# Patient Record
Sex: Female | Born: 1955
Health system: Southern US, Community
[De-identification: ages and names within clinical notes are randomized; demographics above are authoritative.]

## PROBLEM LIST (undated history)

## (undated) DIAGNOSIS — IMO0002 Reserved for concepts with insufficient information to code with codable children: Secondary | ICD-10-CM

## (undated) DIAGNOSIS — I10 Essential (primary) hypertension: Secondary | ICD-10-CM

## (undated) DIAGNOSIS — C549 Malignant neoplasm of corpus uteri, unspecified: Secondary | ICD-10-CM

## (undated) DIAGNOSIS — C541 Malignant neoplasm of endometrium: Secondary | ICD-10-CM

## (undated) DIAGNOSIS — T7840XA Allergy, unspecified, initial encounter: Secondary | ICD-10-CM

## (undated) DIAGNOSIS — Z87410 Personal history of cervical dysplasia: Secondary | ICD-10-CM

## (undated) DIAGNOSIS — N289 Disorder of kidney and ureter, unspecified: Secondary | ICD-10-CM

## (undated) DIAGNOSIS — K449 Diaphragmatic hernia without obstruction or gangrene: Secondary | ICD-10-CM

## (undated) DIAGNOSIS — Z9889 Other specified postprocedural states: Secondary | ICD-10-CM

## (undated) DIAGNOSIS — R112 Nausea with vomiting, unspecified: Secondary | ICD-10-CM

## (undated) DIAGNOSIS — E785 Hyperlipidemia, unspecified: Secondary | ICD-10-CM

## (undated) DIAGNOSIS — K219 Gastro-esophageal reflux disease without esophagitis: Secondary | ICD-10-CM

## (undated) DIAGNOSIS — C569 Malignant neoplasm of unspecified ovary: Secondary | ICD-10-CM

## (undated) HISTORY — DX: Diaphragmatic hernia without obstruction or gangrene: K44.9

## (undated) HISTORY — DX: Malignant neoplasm of unspecified ovary: C56.9

## (undated) HISTORY — PX: MINOR CARPAL TUNNEL: SHX6472

## (undated) HISTORY — DX: Other specified postprocedural states: Z98.890

## (undated) HISTORY — PX: PARS PLANA VITRECTOMY W/ REPAIR OF MACULAR HOLE: SHX2170

## (undated) HISTORY — PX: EYE SURGERY: SHX253

## (undated) HISTORY — DX: Hyperlipidemia, unspecified: E78.5

## (undated) HISTORY — DX: Malignant neoplasm of endometrium: C54.1

## (undated) HISTORY — DX: Allergy, unspecified, initial encounter: T78.40XA

## (undated) HISTORY — PX: ACHILLES TENDON SURGERY: SHX542

## (undated) HISTORY — DX: Other specified postprocedural states: R11.2

## (undated) HISTORY — DX: Malignant neoplasm of corpus uteri, unspecified: C54.9

## (undated) HISTORY — DX: Personal history of cervical dysplasia: Z87.410

## (undated) HISTORY — DX: Gastro-esophageal reflux disease without esophagitis: K21.9

## (undated) HISTORY — DX: Reserved for concepts with insufficient information to code with codable children: IMO0002

## (undated) HISTORY — DX: Essential (primary) hypertension: I10

---

## 1998-03-30 ENCOUNTER — Encounter: Payer: Self-pay | Admitting: Orthopedic Surgery

## 1998-03-30 ENCOUNTER — Observation Stay (HOSPITAL_COMMUNITY): Admission: RE | Admit: 1998-03-30 | Discharge: 1998-03-31 | Payer: Self-pay | Admitting: Orthopedic Surgery

## 1998-11-05 HISTORY — PX: LUMBAR FUSION: SHX111

## 1998-12-14 ENCOUNTER — Other Ambulatory Visit: Admission: RE | Admit: 1998-12-14 | Discharge: 1998-12-14 | Payer: Self-pay | Admitting: Obstetrics and Gynecology

## 2000-01-19 ENCOUNTER — Other Ambulatory Visit: Admission: RE | Admit: 2000-01-19 | Discharge: 2000-01-19 | Payer: Self-pay | Admitting: Obstetrics and Gynecology

## 2001-01-22 ENCOUNTER — Other Ambulatory Visit: Admission: RE | Admit: 2001-01-22 | Discharge: 2001-01-22 | Payer: Self-pay | Admitting: Obstetrics and Gynecology

## 2002-01-14 ENCOUNTER — Other Ambulatory Visit: Admission: RE | Admit: 2002-01-14 | Discharge: 2002-01-14 | Payer: Self-pay | Admitting: Obstetrics and Gynecology

## 2003-01-20 ENCOUNTER — Other Ambulatory Visit: Admission: RE | Admit: 2003-01-20 | Discharge: 2003-01-20 | Payer: Self-pay | Admitting: Obstetrics and Gynecology

## 2003-02-05 HISTORY — PX: GYNECOLOGIC CRYOSURGERY: SHX857

## 2003-02-23 ENCOUNTER — Ambulatory Visit (HOSPITAL_COMMUNITY): Admission: RE | Admit: 2003-02-23 | Discharge: 2003-02-23 | Payer: Self-pay | Admitting: Obstetrics and Gynecology

## 2003-02-23 ENCOUNTER — Encounter (INDEPENDENT_AMBULATORY_CARE_PROVIDER_SITE_OTHER): Payer: Self-pay | Admitting: *Deleted

## 2003-09-05 HISTORY — PX: CERVICAL FUSION: SHX112

## 2003-10-04 ENCOUNTER — Emergency Department (HOSPITAL_COMMUNITY): Admission: EM | Admit: 2003-10-04 | Discharge: 2003-10-04 | Payer: Self-pay | Admitting: Emergency Medicine

## 2003-10-05 ENCOUNTER — Emergency Department (HOSPITAL_COMMUNITY): Admission: EM | Admit: 2003-10-05 | Discharge: 2003-10-05 | Payer: Self-pay | Admitting: Family Medicine

## 2003-10-17 ENCOUNTER — Encounter: Admission: RE | Admit: 2003-10-17 | Discharge: 2003-10-17 | Payer: Self-pay | Admitting: Orthopedic Surgery

## 2003-10-27 ENCOUNTER — Ambulatory Visit (HOSPITAL_COMMUNITY): Admission: RE | Admit: 2003-10-27 | Discharge: 2003-10-28 | Payer: Self-pay | Admitting: Orthopaedic Surgery

## 2004-01-26 ENCOUNTER — Other Ambulatory Visit: Admission: RE | Admit: 2004-01-26 | Discharge: 2004-01-26 | Payer: Self-pay | Admitting: Obstetrics and Gynecology

## 2004-09-06 ENCOUNTER — Other Ambulatory Visit: Admission: RE | Admit: 2004-09-06 | Discharge: 2004-09-06 | Payer: Self-pay | Admitting: Obstetrics and Gynecology

## 2005-03-15 ENCOUNTER — Other Ambulatory Visit: Admission: RE | Admit: 2005-03-15 | Discharge: 2005-03-15 | Payer: Self-pay | Admitting: Obstetrics and Gynecology

## 2006-02-25 ENCOUNTER — Encounter: Payer: Self-pay | Admitting: Internal Medicine

## 2006-02-25 LAB — CONVERTED CEMR LAB

## 2006-07-03 ENCOUNTER — Emergency Department (HOSPITAL_COMMUNITY): Admission: EM | Admit: 2006-07-03 | Discharge: 2006-07-03 | Payer: Self-pay | Admitting: Emergency Medicine

## 2006-12-04 ENCOUNTER — Emergency Department (HOSPITAL_COMMUNITY): Admission: EM | Admit: 2006-12-04 | Discharge: 2006-12-04 | Payer: Self-pay | Admitting: Family Medicine

## 2007-01-27 ENCOUNTER — Encounter: Payer: Self-pay | Admitting: Internal Medicine

## 2007-01-27 ENCOUNTER — Ambulatory Visit: Payer: Self-pay | Admitting: Internal Medicine

## 2007-01-27 DIAGNOSIS — I10 Essential (primary) hypertension: Secondary | ICD-10-CM | POA: Insufficient documentation

## 2007-01-27 DIAGNOSIS — Z8711 Personal history of peptic ulcer disease: Secondary | ICD-10-CM | POA: Insufficient documentation

## 2007-01-27 DIAGNOSIS — E1169 Type 2 diabetes mellitus with other specified complication: Secondary | ICD-10-CM | POA: Insufficient documentation

## 2007-01-27 DIAGNOSIS — K219 Gastro-esophageal reflux disease without esophagitis: Secondary | ICD-10-CM | POA: Insufficient documentation

## 2007-01-27 DIAGNOSIS — E1159 Type 2 diabetes mellitus with other circulatory complications: Secondary | ICD-10-CM | POA: Insufficient documentation

## 2007-01-27 DIAGNOSIS — E785 Hyperlipidemia, unspecified: Secondary | ICD-10-CM

## 2007-01-27 DIAGNOSIS — I152 Hypertension secondary to endocrine disorders: Secondary | ICD-10-CM | POA: Insufficient documentation

## 2007-01-27 DIAGNOSIS — R0789 Other chest pain: Secondary | ICD-10-CM | POA: Insufficient documentation

## 2007-01-27 DIAGNOSIS — J309 Allergic rhinitis, unspecified: Secondary | ICD-10-CM | POA: Insufficient documentation

## 2007-01-27 DIAGNOSIS — Z87410 Personal history of cervical dysplasia: Secondary | ICD-10-CM | POA: Insufficient documentation

## 2007-01-27 DIAGNOSIS — M797 Fibromyalgia: Secondary | ICD-10-CM | POA: Insufficient documentation

## 2007-01-27 DIAGNOSIS — N951 Menopausal and female climacteric states: Secondary | ICD-10-CM | POA: Insufficient documentation

## 2007-01-28 ENCOUNTER — Ambulatory Visit: Payer: Self-pay | Admitting: Internal Medicine

## 2007-01-28 LAB — CONVERTED CEMR LAB
ALT: 19 units/L (ref 0–35)
AST: 19 units/L (ref 0–37)
Albumin: 3.9 g/dL (ref 3.5–5.2)
Alkaline Phosphatase: 73 units/L (ref 39–117)
BUN: 11 mg/dL (ref 6–23)
Basophils Absolute: 0 10*3/uL (ref 0.0–0.1)
Basophils Relative: 0.2 % (ref 0.0–1.0)
Bilirubin, Direct: 0.1 mg/dL (ref 0.0–0.3)
CO2: 26 meq/L (ref 19–32)
Calcium: 9.7 mg/dL (ref 8.4–10.5)
Chloride: 105 meq/L (ref 96–112)
Cholesterol: 275 mg/dL — ABNORMAL HIGH (ref 0–200)
Creatinine, Ser: 1.01 mg/dL (ref 0.40–1.20)
Eosinophils Absolute: 0.3 10*3/uL (ref 0.0–0.6)
Eosinophils Relative: 3.8 % (ref 0.0–5.0)
Glucose, Bld: 118 mg/dL — ABNORMAL HIGH (ref 70–99)
HCT: 40.4 % (ref 36.0–46.0)
HDL: 37 mg/dL — ABNORMAL LOW (ref >39)
Hemoglobin: 13.9 g/dL (ref 12.0–15.0)
Indirect Bilirubin: 0.7 mg/dL (ref 0.0–0.9)
Lymphocytes Relative: 27.4 % (ref 12.0–46.0)
MCHC: 34.2 g/dL (ref 30.0–36.0)
MCV: 87.2 fL (ref 78.0–100.0)
Monocytes Absolute: 0.5 10*3/uL (ref 0.2–0.7)
Monocytes Relative: 7.4 % (ref 3.0–11.0)
Neutro Abs: 4.1 10*3/uL (ref 1.4–7.7)
Neutrophils Relative %: 61.2 % (ref 43.0–77.0)
Platelets: 253 10*3/uL (ref 150–400)
Potassium: 4.7 meq/L (ref 3.5–5.3)
RBC: 4.64 M/uL (ref 3.87–5.11)
RDW: 12 % (ref 11.5–14.6)
Sodium: 138 meq/L (ref 135–145)
TSH: 2.63 microintl units/mL (ref 0.35–5.50)
Total Bilirubin: 0.8 mg/dL (ref 0.3–1.2)
Total CHOL/HDL Ratio: 7.4
Total Protein: 5.9 g/dL — ABNORMAL LOW (ref 6.0–8.3)
Triglycerides: 448 mg/dL — ABNORMAL HIGH (ref <150)
WBC: 6.8 10*3/uL (ref 4.5–10.5)

## 2007-02-21 ENCOUNTER — Ambulatory Visit: Payer: Self-pay | Admitting: Internal Medicine

## 2007-02-21 LAB — CONVERTED CEMR LAB
Hgb A1c MFr Bld: 6 % (ref 4.6–6.0)
Sed Rate: 8 mm/hr (ref 0–25)

## 2007-03-07 ENCOUNTER — Ambulatory Visit: Payer: Self-pay | Admitting: Internal Medicine

## 2007-03-24 LAB — CONVERTED CEMR LAB: Pap Smear: NORMAL

## 2007-04-12 ENCOUNTER — Encounter: Payer: Self-pay | Admitting: Internal Medicine

## 2007-04-15 ENCOUNTER — Ambulatory Visit: Payer: Self-pay | Admitting: Internal Medicine

## 2007-04-15 LAB — CONVERTED CEMR LAB
ALT: 25 units/L (ref 0–35)
AST: 22 units/L (ref 0–37)
BUN: 9 mg/dL (ref 6–23)
CO2: 27 meq/L (ref 19–32)
Calcium: 9.1 mg/dL (ref 8.4–10.5)
Chloride: 107 meq/L (ref 96–112)
Cholesterol: 198 mg/dL (ref 0–200)
Creatinine, Ser: 0.9 mg/dL (ref 0.4–1.2)
GFR calc Af Amer: 85 mL/min
GFR calc non Af Amer: 70 mL/min
Glucose, Bld: 121 mg/dL — ABNORMAL HIGH (ref 70–99)
HDL: 29.8 mg/dL — ABNORMAL LOW (ref >39.0)
Potassium: 3.9 meq/L (ref 3.5–5.1)
Sodium: 141 meq/L (ref 135–145)
Total CHOL/HDL Ratio: 6.6
Triglycerides: 363 mg/dL (ref 0–149)
VLDL: 73 mg/dL — ABNORMAL HIGH (ref 0–40)

## 2007-04-25 ENCOUNTER — Ambulatory Visit: Payer: Self-pay | Admitting: Internal Medicine

## 2007-04-25 DIAGNOSIS — E119 Type 2 diabetes mellitus without complications: Secondary | ICD-10-CM | POA: Insufficient documentation

## 2007-05-09 ENCOUNTER — Ambulatory Visit: Payer: Self-pay

## 2007-07-15 ENCOUNTER — Ambulatory Visit: Payer: Self-pay | Admitting: Internal Medicine

## 2007-07-15 LAB — CONVERTED CEMR LAB
ALT: 21 units/L (ref 0–35)
AST: 18 units/L (ref 0–37)
Albumin: 3.7 g/dL (ref 3.5–5.2)
Alkaline Phosphatase: 57 units/L (ref 39–117)
Bilirubin, Direct: 0.1 mg/dL (ref 0.0–0.3)
Cholesterol: 171 mg/dL (ref 0–200)
Direct LDL: 81 mg/dL
HDL: 30.6 mg/dL — ABNORMAL LOW (ref >39.0)
Total Bilirubin: 0.7 mg/dL (ref 0.3–1.2)
Total CHOL/HDL Ratio: 5.6
Total Protein: 5.9 g/dL — ABNORMAL LOW (ref 6.0–8.3)
Triglycerides: 336 mg/dL (ref 0–149)
VLDL: 67 mg/dL — ABNORMAL HIGH (ref 0–40)

## 2007-07-22 ENCOUNTER — Ambulatory Visit: Payer: Self-pay | Admitting: Internal Medicine

## 2007-07-22 DIAGNOSIS — R109 Unspecified abdominal pain: Secondary | ICD-10-CM | POA: Insufficient documentation

## 2007-07-22 LAB — CONVERTED CEMR LAB
ALT: 44 units/L — ABNORMAL HIGH (ref 0–35)
AST: 28 units/L (ref 0–37)
Albumin: 3.9 g/dL (ref 3.5–5.2)
Alkaline Phosphatase: 77 units/L (ref 39–117)
BUN: 9 mg/dL (ref 6–23)
Basophils Absolute: 0.1 10*3/uL (ref 0.0–0.1)
Basophils Relative: 0.7 % (ref 0.0–1.0)
Bilirubin Urine: NEGATIVE
Bilirubin, Direct: 0.1 mg/dL (ref 0.0–0.3)
CO2: 29 meq/L (ref 19–32)
Calcium: 9.3 mg/dL (ref 8.4–10.5)
Chloride: 108 meq/L (ref 96–112)
Creatinine, Ser: 0.9 mg/dL (ref 0.4–1.2)
Crystals: NEGATIVE
Eosinophils Absolute: 0.3 10*3/uL (ref 0.0–0.6)
Eosinophils Relative: 3.7 % (ref 0.0–5.0)
GFR calc Af Amer: 85 mL/min
GFR calc non Af Amer: 70 mL/min
Glucose, Bld: 94 mg/dL (ref 70–99)
HCT: 40 % (ref 36.0–46.0)
Hemoglobin, Urine: NEGATIVE
Hemoglobin: 13.4 g/dL (ref 12.0–15.0)
Ketones, ur: NEGATIVE mg/dL
Leukocytes, UA: NEGATIVE
Lipase: 28 units/L (ref 11.0–59.0)
Lymphocytes Relative: 31.7 % (ref 12.0–46.0)
MCHC: 33.5 g/dL (ref 30.0–36.0)
MCV: 88.1 fL (ref 78.0–100.0)
Monocytes Absolute: 0.5 10*3/uL (ref 0.2–0.7)
Monocytes Relative: 6.9 % (ref 3.0–11.0)
Mucus, UA: NEGATIVE
Neutro Abs: 4.5 10*3/uL (ref 1.4–7.7)
Neutrophils Relative %: 57 % (ref 43.0–77.0)
Nitrite: NEGATIVE
Platelets: 241 10*3/uL (ref 150–400)
Potassium: 4.5 meq/L (ref 3.5–5.1)
RBC / HPF: NONE SEEN
RBC: 4.54 M/uL (ref 3.87–5.11)
RDW: 13 % (ref 11.5–14.6)
Sodium: 141 meq/L (ref 135–145)
Specific Gravity, Urine: <=1.005 (ref 1.000–1.03)
Total Bilirubin: 0.6 mg/dL (ref 0.3–1.2)
Total Protein, Urine: NEGATIVE mg/dL
Total Protein: 6.2 g/dL (ref 6.0–8.3)
Urine Glucose: NEGATIVE mg/dL
Urobilinogen, UA: 0.2 (ref 0.0–1.0)
WBC: 7.9 10*3/uL (ref 4.5–10.5)
pH: 6.5 (ref 5.0–8.0)

## 2008-03-29 ENCOUNTER — Ambulatory Visit: Payer: Self-pay | Admitting: Internal Medicine

## 2008-04-05 ENCOUNTER — Ambulatory Visit: Payer: Self-pay | Admitting: Internal Medicine

## 2008-04-05 LAB — CONVERTED CEMR LAB
ALT: 18 units/L (ref 0–35)
AST: 16 units/L (ref 0–37)
Cholesterol: 173 mg/dL (ref 0–200)
HDL: 42.8 mg/dL (ref >39.0)
Hgb A1c MFr Bld: 6 % (ref 4.6–6.0)
LDL Cholesterol: 111 mg/dL — ABNORMAL HIGH (ref 0–99)
Total CHOL/HDL Ratio: 4
Triglycerides: 95 mg/dL (ref 0–149)
VLDL: 19 mg/dL (ref 0–40)

## 2008-04-09 ENCOUNTER — Telehealth: Payer: Self-pay | Admitting: Internal Medicine

## 2008-07-09 ENCOUNTER — Ambulatory Visit: Payer: Self-pay | Admitting: Gastroenterology

## 2008-07-23 ENCOUNTER — Encounter: Payer: Self-pay | Admitting: Gastroenterology

## 2008-07-23 ENCOUNTER — Ambulatory Visit: Payer: Self-pay | Admitting: Gastroenterology

## 2008-07-27 ENCOUNTER — Encounter: Payer: Self-pay | Admitting: Gastroenterology

## 2008-10-22 ENCOUNTER — Telehealth: Payer: Self-pay | Admitting: Internal Medicine

## 2008-10-28 ENCOUNTER — Ambulatory Visit: Payer: Self-pay | Admitting: Internal Medicine

## 2008-10-28 LAB — CONVERTED CEMR LAB
ALT: 20 units/L (ref 0–35)
AST: 18 units/L (ref 0–37)
Albumin: 3.9 g/dL (ref 3.5–5.2)
Alkaline Phosphatase: 70 units/L (ref 39–117)
BUN: 10 mg/dL (ref 6–23)
Bilirubin, Direct: 0.1 mg/dL (ref 0.0–0.3)
CO2: 29 meq/L (ref 19–32)
Calcium: 9.1 mg/dL (ref 8.4–10.5)
Chloride: 109 meq/L (ref 96–112)
Cholesterol: 156 mg/dL (ref 0–200)
Creatinine, Ser: 0.8 mg/dL (ref 0.4–1.2)
GFR calc non Af Amer: 79.56 mL/min (ref >60)
Glucose, Bld: 124 mg/dL — ABNORMAL HIGH (ref 70–99)
HDL: 37.6 mg/dL — ABNORMAL LOW (ref >39.00)
Hgb A1c MFr Bld: 6.2 % (ref 4.6–6.5)
LDL Cholesterol: 91 mg/dL (ref 0–99)
Potassium: 4.5 meq/L (ref 3.5–5.1)
Sodium: 144 meq/L (ref 135–145)
TSH: 2.11 microintl units/mL (ref 0.35–5.50)
Total Bilirubin: 0.8 mg/dL (ref 0.3–1.2)
Total CHOL/HDL Ratio: 4
Total Protein: 6.2 g/dL (ref 6.0–8.3)
Triglycerides: 139 mg/dL (ref 0.0–149.0)
VLDL: 27.8 mg/dL (ref 0.0–40.0)

## 2008-11-01 ENCOUNTER — Ambulatory Visit: Payer: Self-pay | Admitting: Internal Medicine

## 2008-11-23 LAB — CONVERTED CEMR LAB: Pap Smear: NORMAL

## 2009-01-17 ENCOUNTER — Ambulatory Visit: Payer: Self-pay | Admitting: Internal Medicine

## 2009-01-17 LAB — CONVERTED CEMR LAB
BUN: 14 mg/dL (ref 6–23)
CO2: 27 meq/L (ref 19–32)
Calcium: 9.4 mg/dL (ref 8.4–10.5)
Chloride: 108 meq/L (ref 96–112)
Creatinine, Ser: 0.8 mg/dL (ref 0.4–1.2)
GFR calc non Af Amer: 79.49 mL/min (ref >60)
Glucose, Bld: 121 mg/dL — ABNORMAL HIGH (ref 70–99)
Hgb A1c MFr Bld: 6 % (ref 4.6–6.5)
Potassium: 5 meq/L (ref 3.5–5.1)
Sodium: 141 meq/L (ref 135–145)

## 2009-01-24 ENCOUNTER — Ambulatory Visit: Payer: Self-pay | Admitting: Internal Medicine

## 2009-01-28 ENCOUNTER — Encounter: Admission: RE | Admit: 2009-01-28 | Discharge: 2009-01-28 | Payer: Self-pay | Admitting: Orthopaedic Surgery

## 2009-03-08 ENCOUNTER — Encounter: Admission: RE | Admit: 2009-03-08 | Discharge: 2009-03-08 | Payer: Self-pay | Admitting: Orthopaedic Surgery

## 2009-05-06 ENCOUNTER — Encounter: Payer: Self-pay | Admitting: Internal Medicine

## 2009-06-07 LAB — HM MAMMOGRAPHY: HM Mammogram: NORMAL

## 2009-06-07 LAB — CONVERTED CEMR LAB: Pap Smear: NORMAL

## 2009-07-13 ENCOUNTER — Encounter (INDEPENDENT_AMBULATORY_CARE_PROVIDER_SITE_OTHER): Payer: Self-pay | Admitting: *Deleted

## 2009-07-18 ENCOUNTER — Ambulatory Visit: Payer: Self-pay | Admitting: Internal Medicine

## 2009-07-18 LAB — CONVERTED CEMR LAB
ALT: 38 units/L — ABNORMAL HIGH (ref 0–35)
AST: 32 units/L (ref 0–37)
BUN: 10 mg/dL (ref 6–23)
CO2: 31 meq/L (ref 19–32)
Calcium: 9.4 mg/dL (ref 8.4–10.5)
Chloride: 110 meq/L (ref 96–112)
Cholesterol: 205 mg/dL — ABNORMAL HIGH (ref 0–200)
Creatinine, Ser: 0.8 mg/dL (ref 0.4–1.2)
Direct LDL: 86.8 mg/dL
GFR calc non Af Amer: 79.34 mL/min (ref >60)
Glucose, Bld: 123 mg/dL — ABNORMAL HIGH (ref 70–99)
HDL: 40.9 mg/dL (ref >39.00)
Hgb A1c MFr Bld: 6.2 % (ref 4.6–6.5)
Potassium: 4.7 meq/L (ref 3.5–5.1)
Sodium: 144 meq/L (ref 135–145)
TSH: 2.1 microintl units/mL (ref 0.35–5.50)
Total CHOL/HDL Ratio: 5
Triglycerides: 715 mg/dL — ABNORMAL HIGH (ref 0.0–149.0)
VLDL: 143 mg/dL — ABNORMAL HIGH (ref 0.0–40.0)

## 2009-07-25 ENCOUNTER — Ambulatory Visit: Payer: Self-pay | Admitting: Internal Medicine

## 2009-08-29 ENCOUNTER — Ambulatory Visit: Payer: Self-pay | Admitting: Internal Medicine

## 2009-08-29 ENCOUNTER — Emergency Department (HOSPITAL_BASED_OUTPATIENT_CLINIC_OR_DEPARTMENT_OTHER): Admission: EM | Admit: 2009-08-29 | Discharge: 2009-08-29 | Payer: Self-pay | Admitting: Emergency Medicine

## 2009-08-29 DIAGNOSIS — A09 Infectious gastroenteritis and colitis, unspecified: Secondary | ICD-10-CM | POA: Insufficient documentation

## 2009-09-06 ENCOUNTER — Ambulatory Visit: Payer: Self-pay | Admitting: Internal Medicine

## 2009-12-21 ENCOUNTER — Encounter (INDEPENDENT_AMBULATORY_CARE_PROVIDER_SITE_OTHER): Payer: Self-pay | Admitting: *Deleted

## 2009-12-23 ENCOUNTER — Ambulatory Visit: Payer: Self-pay | Admitting: Gastroenterology

## 2010-01-06 ENCOUNTER — Ambulatory Visit: Payer: Self-pay | Admitting: Gastroenterology

## 2010-01-06 LAB — HM COLONOSCOPY

## 2010-01-12 ENCOUNTER — Ambulatory Visit: Payer: Self-pay | Admitting: Internal Medicine

## 2010-01-12 ENCOUNTER — Telehealth: Payer: Self-pay | Admitting: Internal Medicine

## 2010-01-12 LAB — CONVERTED CEMR LAB
BUN: 11 mg/dL (ref 6–23)
CO2: 30 meq/L (ref 19–32)
Calcium: 9.6 mg/dL (ref 8.4–10.5)
Chloride: 109 meq/L (ref 96–112)
Creatinine, Ser: 0.8 mg/dL (ref 0.4–1.2)
GFR calc non Af Amer: 84.03 mL/min (ref >60)
Glucose, Bld: 116 mg/dL — ABNORMAL HIGH (ref 70–99)
Potassium: 5.9 meq/L — ABNORMAL HIGH (ref 3.5–5.1)
Sodium: 145 meq/L (ref 135–145)

## 2010-01-13 ENCOUNTER — Encounter: Payer: Self-pay | Admitting: Gastroenterology

## 2010-01-30 ENCOUNTER — Ambulatory Visit: Payer: Self-pay | Admitting: Internal Medicine

## 2010-02-03 LAB — CONVERTED CEMR LAB
ALT: 65 units/L — ABNORMAL HIGH (ref 0–35)
AST: 36 units/L (ref 0–37)
Albumin: 4.2 g/dL (ref 3.5–5.2)
Alkaline Phosphatase: 95 units/L (ref 39–117)
BUN: 16 mg/dL (ref 6–23)
Bilirubin, Direct: 0.1 mg/dL (ref 0.0–0.3)
CO2: 29 meq/L (ref 19–32)
Calcium: 9.5 mg/dL (ref 8.4–10.5)
Chloride: 105 meq/L (ref 96–112)
Cholesterol: 219 mg/dL — ABNORMAL HIGH (ref 0–200)
Creatinine, Ser: 0.9 mg/dL (ref 0.4–1.2)
Direct LDL: 143.2 mg/dL
GFR calc non Af Amer: 70.93 mL/min (ref >60)
Glucose, Bld: 120 mg/dL — ABNORMAL HIGH (ref 70–99)
HDL: 40.7 mg/dL (ref >39.00)
Potassium: 4.8 meq/L (ref 3.5–5.1)
Sodium: 140 meq/L (ref 135–145)
Total Bilirubin: 0.6 mg/dL (ref 0.3–1.2)
Total CHOL/HDL Ratio: 5
Total Protein: 6.6 g/dL (ref 6.0–8.3)
Triglycerides: 236 mg/dL — ABNORMAL HIGH (ref 0.0–149.0)
VLDL: 47.2 mg/dL — ABNORMAL HIGH (ref 0.0–40.0)

## 2010-05-07 HISTORY — PX: LYMPH NODE DISSECTION: SHX5087

## 2010-05-15 LAB — HM PAP SMEAR: HM Pap smear: NORMAL

## 2010-05-29 ENCOUNTER — Encounter: Payer: Self-pay | Admitting: Orthopaedic Surgery

## 2010-05-30 ENCOUNTER — Other Ambulatory Visit: Payer: Self-pay | Admitting: Obstetrics and Gynecology

## 2010-06-06 NOTE — Assessment & Plan Note (Signed)
Summary: 3 MO ROV/NWS   Vital Signs:  Patient Profile:   55 Years Old Female Height:     62 inches Weight:      140 pounds BMI:     25.70 Temp:     97.3 degrees F oral Pulse rate:   71 / minute BP sitting:   129 / 81  (right arm)  Vitals Entered By: Glendell Docker (July 22, 2007 3:36 PM)                 Chief Complaint:  3 MONTH FOLLOW UP and Abdominal pain.  History of Present Illness:  Abdominal Pain      This is a 55 year old woman who presents with Abdominal pain.  The patient reports nausea and vomiting, but denies melena and hematochezia.  The location of the pain is epigastric.  The pain is described as intermittent.      Current Allergies (reviewed today): ! TYLOX ! GLUCOPHAGE  Past Medical History:    Reviewed history from 01/27/2007 and no changes required:       GERD       Hyperlipidemia       Hypertension       Hx of Cervical Dysplasia       Hx of Gastric Ulcers       Allergic rhinitis - (Allergy shots x 2 yrs)  Past Surgical History:    Reviewed history from 01/27/2007 and no changes required:       Cervical fusion - 09/2003       Lumbar fusion - 11/1998       Cervical cryosurgery 02/2003     Family History of Arthritis    Family History of CAD Female 1st degree relative <60    Family History Diabetes 1st degree relative    Family History Lung cancer    Family History of Prostate CA 1st degree relative <50    Family History of Stroke F 1st degree relative <60  Social History:    Reviewed history from 01/27/2007 and no changes required:       Occupation:  Receptionist       Married for 30 years       Never Smoked       Alcohol use-no       Regular exercise-yes       One step son        One Daughter   Risk Factors:  Mammogram History:     Date of Last Mammogram:  03/24/2007    Results:  normal   PAP Smear History:     Date of Last PAP Smear:  03/24/2007    Results:  normal     Physical Exam  General:      alert, well-developed, and well-nourished.   Head:     normocephalic and atraumatic.   Neck:     supple and no masses.   Lungs:     normal respiratory effort, normal breath sounds, and no wheezes.   Heart:     normal rate, regular rhythm, no murmur, and no gallop.   Abdomen:     mild epigastric tenderness.soft, no guarding, and no rigidity.   Extremities:     No lower extremity edema     Impression & Recommendations:  Problem # 1:  ABDOMINAL PAIN (ICD-789.00) Pt with abd pain with nausea.  She had vomiting two weeks ago.  I suspect resolving gastroenteritis.  She has history of GERD.  She previously was drinking  a pot of coffee in the morning and taking NSAIDs regularly.  Consider GI referral.  Restart omeprazole.  Orders: TLB-BMP (Basic Metabolic Panel-BMET) (80048-METABOL) TLB-CBC Platelet - w/Differential (85025-CBCD) TLB-Hepatic/Liver Function Pnl (80076-HEPATIC) TLB-Lipase (83690-LIPASE) TLB-Udip w/ Micro (81001-URINE)   Problem # 2:  HYPERLIPIDEMIA (ICD-272.4) Pt tolerating simvastatin.  Her TG are still suboptimal.  I urged pt to take fish oil caps (2 by mouth two times a day).  Her updated medication list for this problem includes:    Simvastatin 40 Mg Tabs (Simvastatin) ..... One by mouth once daily  Labs Reviewed: Chol: 171 (07/15/2007)   HDL: 30.6 (07/15/2007)   LDL: DEL (07/15/2007)   TG: 336 (07/15/2007) SGOT: 18 (07/15/2007)   SGPT: 21 (07/15/2007)   Problem # 3:  CHEST PAIN, ATYPICAL (ICD-786.59) Improved.  I reviewed negative myoview.  Complete Medication List: 1)  Omeprazole 20 Mg Cpdr (Omeprazole) .... One by mouth two times a day 2)  Angeliq 0.5-1 Mg Tabs (Drospirenone-estradiol) .... One by mouth qd 3)  Xanax 0.5 Mg Tabs (Alprazolam) .... Take 1 tablet by mouth once a day at bedtime as needed 4)  Simvastatin 40 Mg Tabs (Simvastatin) .... One by mouth once daily   Patient Instructions: 1)  Please schedule a follow-up appointment in 2 weeks.     Prescriptions: SIMVASTATIN 40 MG  TABS (SIMVASTATIN) one by mouth once daily  #30 x 5   Entered and Authorized by:   D. Thomos Lemons DO   Signed by:   D. Thomos Lemons DO on 07/22/2007   Method used:   Electronically sent to ...       [facility]       [address]       [place], [place]  [address]       Ph: [phone] or [phone]       Fax: [phone]   RxID:   [id number] OMEPRAZOLE 20 MG  CPDR (OMEPRAZOLE) one by mouth two times a day  #60 x 5   Entered and Authorized by:   D. Thomos Lemons DO   Signed by:   D. Thomos Lemons DO on 07/22/2007   Method used:   Electronically sent to ...       [facility]       [address]       [place], [place]  [address]       Ph: [phone] or [phone]       Fax: [phone]   RxID:   [id number]  ] Current Allergies (reviewed today): ! TYLOX ! GLUCOPHAGE   Preventive Care Screening  Last Flu Shot:    Date:  07/22/2007    Results:  Declined  Mammogram:    Date:  03/24/2007    Results:  normal   Pap Smear:    Date:  03/24/2007    Results:  normal

## 2010-06-06 NOTE — Assessment & Plan Note (Signed)
Summary: 6 WK ROV/NWS   Vital Signs:  Patient Profile:   55 Years Old Female Height:     62 inches Weight:      141 pounds Temp:     97.8 degrees F oral Pulse rate:   58 / minute Pulse rhythm:   regular BP sitting:   130 / 82  (right arm)  Vitals Entered By: Rock Nephew CMA (April 25, 2007 3:40 PM)                 Chief Complaint:  8wk follow up.  History of Present Illness: 55 year old white female for follow-up.  She was previously seen for atypical chest pain.  Patient was taking daily doses of over-the-counter NSAIDs.  She has significantly decreased NSAID use.  She reports atypical chest pain has significantly improved since starting omeprazole.  Patient still having intermittent nonexertional symptoms.  We reviewed recent lipid panel.  Patient has elevated LDL and hyper triglyceridemia.  Patient consumes alcohol somewhat regularly.  She has decreased her soft drink intake.  Current Allergies: ! TYLOX  Past Medical History:    Reviewed history from 01/27/2007 and no changes required:       GERD       Hyperlipidemia       Hypertension       Hx of Cervical Dysplasia       Hx of Gastric Ulcers       Allergic rhinitis - (Allergy shots x 2 yrs)  Past Surgical History:    Reviewed history from 01/27/2007 and no changes required:       Cervical fusion - 09/2003       Lumbar fusion - 11/1998       Cervical cryosurgery 02/2003   Family History:    Reviewed history from 01/27/2007 and no changes required:       Family History of Arthritis       Family History of CAD Female 1st degree relative <60       Family History Diabetes 1st degree relative       Family History Lung cancer       Family History of Prostate CA 1st degree relative <50       Family History of Stroke F 1st degree relative <60  Social History:    Reviewed history from 01/27/2007 and no changes required:       Occupation:  Receptionist       Married for 30 years       Never Smoked        Alcohol use-no       Regular exercise-yes       One step son        One Daughter    Review of Systems      See HPI       Patient drinking 6 cups of coffee in the morning.   Physical Exam  General:     Well-developed,well-nourished,in no acute distress; alert,appropriate and cooperative throughout examination Neck:     No deformities, masses, or tenderness noted. Lungs:     Normal respiratory effort, chest expands symmetrically. Lungs are clear to auscultation, no crackles or wheezes. Heart:     Normal rate and regular rhythm. S1 and S2 normal without gallop, murmur, click, rub or other extra sounds. Abdomen:     soft and non-tender.   Extremities:     No lower extremity edema     Impression & Recommendations:  Problem # 1:  HYPERLIPIDEMIA (ICD-272.4)  Patient with multiple risk factors.  Goal LDL less than 100.  Provided handout on hypertriglyceridemia. Start fish oil capsules b.i.d. Her updated medication list for this problem includes:    Simvastatin 40 Mg Tabs (Simvastatin) ..... One by mouth once daily  Labs Reviewed: Chol: 198 (04/15/2007)   HDL: 29.8 (04/15/2007)   LDL: DEL (04/15/2007)   TG: 363 (04/15/2007) SGOT: 22 (04/15/2007)   SGPT: 25 (04/15/2007)   Problem # 2:  DIABETES MELLITUS, TYPE II, BORDERLINE (ICD-790.29) Patient has family history of diabetes.  Metformin may help improve hypertriglyceridemia.  Her updated medication list for this problem includes:    Glucophage Xr 500 Mg Tb24 (Metformin hcl) ..... One by mouth two times a day  Labs Reviewed: HgBA1c: 6.0 (02/21/2007)   Creat: 0.9 (04/15/2007)      Problem # 3:  CHEST PAIN, ATYPICAL (ICD-786.59) Her discomfort has significantly improved since starting omeprazole q.a.m.  She will reports still having intermittent nonexertional chest discomfort.  Symptoms are nonexertional.  Arrange adenosine Myoview. Orders: Cardiology Referral (Cardiology)   Complete Medication List:  1)  Omeprazole 20 Mg Cpdr (Omeprazole) .... One by mouth two times a day 2)  Angeliq 0.5-1 Mg Tabs (Drospirenone-estradiol) .... One by mouth qd 3)  Xanax 0.5 Mg Tabs (Alprazolam) .... Take 1 tablet by mouth once a day at bedtime as needed 4)  Simvastatin 40 Mg Tabs (Simvastatin) .... One by mouth once daily 5)  Glucophage Xr 500 Mg Tb24 (Metformin hcl) .... One by mouth two times a day   Patient Instructions: 1)  Please schedule a follow-up appointment in 3 months. 2)  Hepatic Panel prior to visit, ICD-9:  272.4 3)  Lipid Panel prior to visit, ICD-9:  272.4 4)  Please return for lab work one (1) week before your next appointment.  5)  Take fish oil capsules (two) two times a day with meals. 6)  Take caltrate with vit d 600 mg by mouth two times a day with meals.    Prescriptions: GLUCOPHAGE XR 500 MG  TB24 (METFORMIN HCL) one by mouth two times a day  #60 x 5   Entered and Authorized by:   Dondra Spry DO   Signed by:   Dondra Spry DO on 04/25/2007   Method used:   Electronically sent to ...       [facility]       [address]       [place], [place]  [address]       Ph: [phone] or [phone]       Fax: [phone]   RxID:   [id number] SIMVASTATIN 40 MG  TABS (SIMVASTATIN) one by mouth once daily  #30 x 5   Entered and Authorized by:   Dondra Spry DO   Signed by:   Dondra Spry DO on 04/25/2007   Method used:   Electronically sent to ...       [facility]       [address]       [place], [place]  [address]       Ph: [phone] or [phone]       Fax: [phone]   RxID:   [id number]  ]  Appended Document: 54 WK ROV/NWS call pt - stress test normal  Appended Document: 6 WK ROV/NWS left voice message at [phone] to call  Appended Document:  6 WK ROV/NWS Pt informed

## 2010-06-06 NOTE — Assessment & Plan Note (Signed)
Summary: med refill- jr   Vital Signs:  Patient profile:   55 year old female Height:      62 inches Weight:      138 pounds Pulse rate:   66 / minute Resp:     18 per minute BP sitting:   110 / 58  (left arm)  Vitals Entered By: Jeremy Johann CMA (November 01, 2008 2:39 PM) CC: MED REFILL   Primary Care Provider:  Dondra Spry DO  CC:  MED REFILL.  History of Present Illness:  Hyperlipidemia Follow-Up      This is a 55 year old woman who presents for Hyperlipidemia follow-up.  The patient denies muscle aches and GI upset.  The patient denies the following symptoms: chest pain/pressure.  Compliance with medications (by patient report) has been near 100%.  Dietary compliance has been fair.  The patient reports exercising occasionally.    Abnormal glucose - she is not following diabetic diet.  she is exercising regularly  Allergies: 1)  ! Tylox  Past History:  Past Medical History: GERD Hyperlipidemia Hypertension  Hx of Cervical Dysplasia Hx of Gastric Ulcers Allergic rhinitis - (Allergy shots x 2 yrs)   Past Surgical History: Cervical fusion - 09/2003  Lumbar fusion - 11/1998 Cervical cryosurgery 02/2003    Family History: Family History of Arthritis Family History of CAD Female 1st degree relative <60 Family History Diabetes 1st degree relative Family History Lung cancer Family History of Prostate CA 1st degree relative <50 Family History of Stroke F 1st degree relative <60    Social History: Occupation:  Receptionist Married for 30 years Never Smoked Alcohol use-no Regular exercise-yes One step son  One  Daughter  Physical Exam  General:  alert, well-developed, and well-nourished.   Neck:  supple and no masses.  no carotid bruits.   Lungs:  normal respiratory effort and normal breath sounds.   Heart:  normal rate, regular rhythm, and no gallop.   Extremities:  No lower extremity edema   Neurologic:  cranial nerves II-XII intact and gait normal.     Impression & Recommendations:  Problem # 1:  HYPERLIPIDEMIA (ICD-272.4) Stable.  Maintain current medication regimen.  Her updated medication list for this problem includes:    Simvastatin 40 Mg Tabs (Simvastatin) ..... One by mouth once daily  Labs Reviewed: SGOT: 18 (10/28/2008)   SGPT: 20 (10/28/2008)   HDL:37.60 (10/28/2008), 42.8 (04/05/2008)  LDL:91 (10/28/2008), 111 (25/42/7062)  Chol:156 (10/28/2008), 173 (04/05/2008)  Trig:139.0 (10/28/2008), 95 (04/05/2008)  Problem # 2:  DIABETES MELLITUS, TYPE II, BORDERLINE (ICD-790.29) A1c is worse at 6.2.   I suggest we start metformin.   We discussed common side effects.  We also reviewed lifestyle/dietary changes.   Her updated medication list for this problem includes:    Glucophage 500 Mg Tabs (Metformin hcl) .Marland Kitchen... 1/2 by mouth two times a day x 1 week,  then one by mouth qd  Labs Reviewed: Creat: 0.8 (10/28/2008)     Complete Medication List: 1)  Omeprazole 20 Mg Cpdr (Omeprazole) .... One by mouth two times a day 2)  Angeliq 0.5-1 Mg Tabs (Drospirenone-estradiol) .... One by mouth qd 3)  Xanax 0.5 Mg Tabs (Alprazolam) .... Take 1 tablet by mouth once a day at bedtime as needed 4)  Simvastatin 40 Mg Tabs (Simvastatin) .... One by mouth once daily 5)  Glucophage 500 Mg Tabs (Metformin hcl) .... 1/2 by mouth two times a day x 1 week,  then one by mouth qd  Patient Instructions: 1)  Please schedule a follow-up appointment in 3 months. 2)  BMP prior to visit, ICD-9:  790.29 3)  HbgA1C prior to visit, ICD-9:  790.29 4)  Please return for lab work one (1) week before your next appointment.  Prescriptions: SIMVASTATIN 40 MG  TABS (SIMVASTATIN) one by mouth once daily  #30 x 5   Entered and Authorized by:   D. Thomos Lemons DO   Signed by:   D. Thomos Lemons DO on 11/01/2008   Method used:   Electronically to        [facility] (retail)        [address]       [place], [place]  [address]       Ph: [phone] or [phone]       Fax: [phone]   RxID:   [id number] OMEPRAZOLE 20 MG  CPDR (OMEPRAZOLE) one by mouth two times a day  #60 x 5   Entered and Authorized by:   D. Thomos Lemons DO   Signed by:   D. Thomos Lemons DO on 11/01/2008   Method used:   Electronically to        [facility] (retail)       [address]       [place], [place]  [address]       Ph: [phone] or [phone]       Fax: [phone]   RxID:   [id number] GLUCOPHAGE 500 MG TABS (METFORMIN HCL) 1/2 by mouth two times a day x 1 week,  then one by mouth qd  #60 x 2   Entered and Authorized by:   D. Thomos Lemons DO   Signed by:   D. Thomos Lemons DO on 11/01/2008   Method used:   Electronically to        [facility] (retail)       [address]       [place], [place]  [address]       Ph: [phone] or [phone]       Fax: [phone]   RxID:   [id number]

## 2010-06-06 NOTE — Letter (Signed)
Summary: Results Letter  [facility]  [address] [place], [place] [address]   Phone: [phone]  Fax: [phone]        July 27, 2008 MRN: [id number]    Jamie [name] [address] [place], [place]  [address]    Dear Ms. Schlabach,   The polyp(s) removed during your recent procedure were proven to be adenomatous.  These are pre-cancerous polyps that may have grown into cancers if they had not been removed.  Based on current nationally recognized surveillance guidelines, I recommend that you have a repeat colonoscopy in 1 year. Hopefully we will be able to space you out to about 3 or 5 years after that procedure.  We will therefore put your information in our reminder system and will contact you in 1 year to schedule a repeat procedure.  Please call if you have any questions or concerns.       Sincerely,  Rachael Fee MD  This letter has been electronically signed by your physician.

## 2010-06-06 NOTE — Letter (Signed)
Summary: Colonoscopy Letter  [facility]  [address] [place], [place] [address]   Phone: [phone]  Fax: [phone]      July 13, 2009 MRN: [id number]   ALDONA BRYNER [address] [place], [place]  [address]   Dear Ms. Seide,   According to your medical record, it is time for you to schedule a Colonoscopy. The American Cancer Society recommends this procedure as a method to detect early colon cancer. Patients with a family history of colon cancer, or a personal history of colon polyps or inflammatory bowel disease are at increased risk.  This letter has beeen generated based on the recommendations made at the time of your procedure. If you feel that in your particular situation this may no longer apply, please contact our office.  Please call our office at [phone] to schedule this appointment or to update your records at your earliest convenience.  Thank you for cooperating with [place] to provide you with the very best care possible.   Sincerely,  Rachael Fee, M.D.  [facility] [phone]

## 2010-06-06 NOTE — Miscellaneous (Signed)
Summary: previsit prep/rm  Clinical Lists Changes  Medications: Added new medication of MOVIPREP 100 GM  SOLR (PEG-KCL-NACL-NASULF-NA ASC-C) As per prep instructions. - Signed Rx of MOVIPREP 100 GM  SOLR (PEG-KCL-NACL-NASULF-NA ASC-C) As per prep instructions.;  #1 x 0;  Signed;  Entered by: Sherren Kerns RN;  Authorized by: Rachael Fee MD;  Method used: Electronically to [facility] [phone]*, [address] [place], [place], [place]  [address], Ph: [phone] or [phone], Fax: [phone] Observations: Added new observation of ALLERGY REV: Done (12/23/2009 8:08)    Prescriptions: MOVIPREP 100 GM  SOLR (PEG-KCL-NACL-NASULF-NA ASC-C) As per prep instructions.  #1 x 0   Entered by:   Sherren Kerns RN   Authorized by:   Rachael Fee MD   Signed by:   Sherren Kerns RN on 12/23/2009   Method used:   Electronically to        [facility] (retail)       [address]       [place], [place]  [address]       Ph: [phone] or [phone]       Fax: [phone]   RxID:   [id number]

## 2010-06-06 NOTE — Progress Notes (Signed)
Summary: Ear pain  Phone Note Call from Patient Call back at Home Phone [phone] Call back at Work Phone [phone]   Caller: Patient Summary of Call: Patient called and states she is having some right ear problems. She states she has an increase in ear pain and pressure on her right side with dizziness. She is requesting an antibiotic to help relieve the pressure and dizziness that she has. She says at her last visit she was told that she had fluid in her ear and thought she was to get an antibiotic at that time. Patient was advised there was no mention in the office note of her ear problem, and she may need to schedule an office visit for evaluation. Patient declined to schedule at this time. She was informed that Dr Artist Pais will be in the office on Monday and she is okay to wait until then to have her concerns addressed. Initial call taken by: Glendell Docker CMA,  April 09, 2008 2:41 PM  Follow-up for Phone Call        Take Ceftin as directed.   If no improvement in pain within 1 wk,  she will need OV Follow-up by: D. Thomos Lemons DO,  April 12, 2008 12:17 PM  Additional Follow-up for Phone Call Additional follow up Details #1::        left voice message at [phone] to call Additional Follow-up by: Glendell Docker CMA,  April 12, 2008 1:03 PM    Additional Follow-up for Phone Call Additional follow up Details #2::    patient advised per Dr Artist Pais instructions Follow-up by: Glendell Docker CMA,  April 12, 2008 4:08 PM  New/Updated Medications: CEFUROXIME AXETIL 250 MG TABS (CEFUROXIME AXETIL) one by mouth bid   Prescriptions: CEFUROXIME AXETIL 250 MG TABS (CEFUROXIME AXETIL) one by mouth bid  #14 x 0   Entered and Authorized by:   D. Thomos Lemons DO   Signed by:   D. Thomos Lemons DO on 04/12/2008   Method used:   Electronically to        [facility] (retail)       [address]       [place], [place]  [address]        Ph: [phone] or [phone]       Fax: [phone]   RxID:   [id number]

## 2010-06-06 NOTE — Procedures (Signed)
Summary: Colonoscopy  Patient: [name] Note: All result statuses are Final unless otherwise noted.  Tests: (1) Colonoscopy (COL)   COL Colonoscopy           DONE     [facility]     [address]     [place], [place]  [address]           COLONOSCOPY PROCEDURE REPORT           PATIENT:  Jamie [name], Jamie [name]  MR#:  [id number]     BIRTHDATE:  1956/02/15, 53 yrs. old  GENDER:  female     ENDOSCOPIST:  Rachael Fee, MD     PROCEDURE DATE:  01/06/2010     PROCEDURE:  Colonoscopy with biopsy     ASA CLASS:  Class II     INDICATIONS:  previous adenomatous polyps removed in 2010 (one     piecemeal resected)     MEDICATIONS:   Fentanyl 75 mcg IV, Versed 8 mg IV           DESCRIPTION OF PROCEDURE:   After the risks benefits and     alternatives of the procedure were thoroughly explained, informed     consent was obtained.  Digital rectal exam was performed and     revealed no rectal masses.   The LB [id number] endoscope     was introduced through the anus and advanced to the cecum, which     was identified by both the appendix and ileocecal valve, without     limitations.  The quality of the prep was good, using MoviPrep.     The instrument was then slowly withdrawn as the colon was fully     examined.           <<PROCEDUREIMAGES>>           FINDINGS:  There were two small sessile polyps, both measured     1-38mm across, were removed with forceps and sent to pathology (jar     1). These were located in ascending and transverse colon. The site     of previous polypectomies were normal appearing (see image3).     This was otherwise a normal examination of the colon (see image4,     image2, and image1).   Retroflexed views in the rectum revealed no     abnormalities.    The scope was then withdrawn from the patient     and the procedure completed.           COMPLICATIONS:  None     ENDOSCOPIC IMPRESSION:     1) Two small polyps removed and sent to pathology.   Previous     polypectomy sites looked clear.     2) Otherwise normal examination           RECOMMENDATIONS:     1) Given your personal history of adenomatous (pre-cancerous)     polyps, you will need a repeat colonoscopy in 5 years even if the     polyps removed today are NOT adenomatous     2) You will receive a letter within 1-2 weeks with the results     of your biopsy as well as final recommendations. Please call my     office if you have not received a letter after 3 weeks.           ______________________________     Rachael Fee, MD  cc: Thomos Lemons, MD           n.     Rosalie Doctor:   Rachael Fee at 01/06/2010 09:43 AM           Jamie [name], [id number]  Note: An exclamation mark (!) indicates a result that was not dispersed into the flowsheet. Document Creation Date: 01/06/2010 9:43 AM _______________________________________________________________________  (1) Order result status: Final Collection or observation date-time: 01/06/2010 09:37 Requested date-time:  Receipt date-time:  Reported date-time:  Referring Physician:   Ordering Physician: Rob Bunting [phone]) Specimen Source:  Source: Launa Grill Order Number: [id number] Lab site:   Appended Document: Colonoscopy     Procedures Next Due Date:    Colonoscopy: 01/2015

## 2010-06-06 NOTE — Procedures (Signed)
Summary: Colonoscopy   Colonoscopy  Procedure date:  07/23/2008  Findings:      Location:  [facility].    Procedures Next Due Date:    Colonoscopy: 08/2009  COLONOSCOPY PROCEDURE REPORT  PATIENT:  Jamie [name], Jamie [name]  MR#:  [id number] BIRTHDATE:   [birth year], 52 yrs. old   GENDER:   female  ENDOSCOPIST:   Rachael Fee, MD Referred by: Thomos Lemons, DO  PROCEDURE DATE:  07/23/2008 PROCEDURE:  Colonoscopy with snare polypectomy ASA CLASS:   Class II INDICATIONS: colorectal cancer screening, average risk   MEDICATIONS:    Fentanyl 100 mcg IV, Versed 12 mg IV  DESCRIPTION OF PROCEDURE:   After the risks benefits and alternatives of the procedure were thoroughly explained, informed consent was obtained.  Digital rectal exam was performed and revealed no rectal masses.   The LB [id number] endoscope was introduced through the anus and advanced to the cecum, which was identified by both the appendix and ileocecal valve, without limitations.  The quality of the prep was good, using MoviPrep.  The instrument was then slowly withdrawn as the colon was fully examined. <<PROCEDUREIMAGES>>                <<OLD IMAGES>>  FINDINGS:   Three polyps were found, removed and sent to pathology. One was intimately associated with IC valve, 15mm, slighty pedunculated, removed with snare/cautery (jar1). The second was 7mm, sessile, located in ascending colon, removed withe snare, cautery (jar 2). The third was 16mm, sessile, located very close to the second polyp in ascending colon, removed in piecemeal fashion with snare/cautery (jar 2) (see image5, image6, image7, image9, image10, and image11).  Internal and external hemorrhoids were found. These were medium sized, non-thrombosed.  This was otherwise a normal examination (see image1, image2, and image12).   Retroflexion was not performed.  The scope was then withdrawn from the patient and the procedure completed.   COMPLICATIONS:   None  ENDOSCOPIC IMPRESSION:  1) 3 polyps (2 were larger than 1cm), all removed and sent to pathology.  One was removed in piecemeal fashion  2) Internal and external hemorrhoids  3) Otherwise normal examination  RECOMMENDATIONS:  1) If the polyp(s) removed today are proven to be adenomatous (pre-cancerous) polyps, you will need a colonoscopy in 1 year given piecemeal type resection.  2) You will recieve a letter within 1-2 weeks with the results of your biopsy as well as final recommendations. Please call my office if you have not received a letter after 3 weeks.  REPEAT EXAM:   await pathology   _______________________________ Rachael Fee, MD  cc: Dietrich Pates, MD      Thomos Lemons, DO        REPORT OF SURGICAL PATHOLOGY   Case #: [id number] Patient Name: Jamie [name], Jamie [name]. Office Chart Number:  [id number]   MRN: [id number] Pathologist: Alden Server A. Delila Spence, MD DOB/Age  11-07-1955 (Age: 54)    Gender: F Date Taken:  07/23/2008 Date Received: 07/23/2008   FINAL DIAGNOSIS   ***MICROSCOPIC EXAMINATION AND DIAGNOSIS***   1.  COLON, CECUM, POLYP:   -  BENIGN POLYPOID COLONIC MUCOSA WITH SUBMUCOSAL ADIPOSE TISSUE CONSISTENT WITH           LIPOMA. -  NO ADENOMATOUS CHANGE OR MALIGNANCY IDENTIFIED.     2.  COLON, ASCENDING, POLYPS:  -  FRAGMENTS OF ADENOMATOUS POLYP (CLINICALLY TWO POLYPS).   kv Date Reported:  07/26/2008     Alden Server A. Delila Spence, MD *** Electronically Signed  Out By EAA ***      July 27, 2008 MRN: [id number]    Jamie [name] [address] [place], [place]  [address]    Dear Ms. Thaw,    The polyp(s) removed during your recent procedure were proven to be adenomatous.  These are pre-cancerous polyps that may have grown into cancers if they had not been removed.  Based on current nationally recognized surveillance guidelines, I recommend that you have a repeat colonoscopy in 1 year. Hopefully we will be able to space you out to about 3 or 5 years after that procedure.  We will therefore put your information in our reminder system and will contact you in 1 year to schedule a repeat procedure.  Please call if you have any questions or concerns.       Sincerely,  Rachael Fee MD  This letter has been electronically signed by your physician.   Signed by Rachael Fee MD on 07/27/2008 at 7:54 AM   This report was created from the original endoscopy report, which was reviewed and signed by the above listed endoscopist.

## 2010-06-06 NOTE — Assessment & Plan Note (Signed)
Summary: 6 MONTH FOLLOW UP/MHF   Vital Signs:  Patient profile:   55 year old female Weight:      139.25 pounds BMI:     25.56 O2 Sat:      97 % on Room air Temp:     98.1 degrees F oral Pulse rate:   69 / minute Pulse rhythm:   regular Resp:     18 per minute BP sitting:   130 / 70  (right arm) Cuff size:   regular  Vitals Entered By: Glendell Docker CMA (July 25, 2009 3:06 PM)  O2 Flow:  Room air CC: Rm 3- 6 Month Follow up   Primary Care Provider:  Dondra Spry DO  CC:  Rm 3- 6 Month Follow up.  History of Present Illness:  Hyperlipidemia Follow-Up      This is a 55 year old woman who presents for Hyperlipidemia follow-up.  The patient denies muscle aches and GI upset.  The patient denies the following symptoms: chest pain/pressure.  Compliance with medications (by patient report) has been near 100%.  Dietary compliance has been fair and poor.  The patient reports exercising occasionally.  she can not tolerate fish oil caps due to GI upset.  DM II - borderline.  better with diet.   eating more packaged wt watcher meals.  noticed more fluid retention   Allergies: 1)  ! Tylox  Past History:  Past Medical History: GERD Hyperlipidemia Hypertension  Hx of Cervical Dysplasia   Hx of Gastric Ulcers Allergic rhinitis - (Allergy shots x 2 yrs)    Past Surgical History: Cervical fusion - 09/2003   Lumbar fusion - 11/1998  Cervical cryosurgery 02/2003     Family History: Family History of Arthritis Family History of CAD Female 1st degree relative <60 Family History Diabetes 1st degree relative Family History Lung cancer Family History of Prostate CA 1st degree relative <50 Family History of Stroke F 1st degree relative <60        Social History: Occupation:  Receptionist Married for 30 years Never Smoked Alcohol use-no  Regular exercise-yes One step son  One  Daughter    Review of Systems       Pt reports episode of neck pain and left arm pain.  no  chest pain or sob  Physical Exam  General:  alert, well-developed, and well-nourished.   Eyes:  pupils equal, pupils round, and pupils reactive to light.   Neck:  supple and no masses.  no carotid bruits.   Lungs:  normal respiratory effort and normal breath sounds.   Heart:  normal rate, regular rhythm, and no gallop.   Abdomen:  soft, non-tender, and normal bowel sounds.   Extremities:  No lower extremity edema  Neurologic:  cranial nerves II-XII intact and gait normal.   Psych:  normally interactive and good eye contact.      Impression & Recommendations:  Problem # 1:  HYPERLIPIDEMIA (ICD-272.4) triglycerides much worse.  she does not recall what she ate before blood test.   repeat FLP in one month.  dietary counseling provided.  if persistent elevation in triglycerides at fenofibrate Her updated medication list for this problem includes:    Simvastatin 40 Mg Tabs (Simvastatin) ..... One by mouth once daily  Labs Reviewed: SGOT: 32 (07/18/2009)   SGPT: 38 (07/18/2009)   HDL:40.90 (07/18/2009), 37.60 (10/28/2008)  LDL:91 (10/28/2008), 111 (27/78/2423)  Chol:205 (07/18/2009), 156 (10/28/2008)  Trig:715.0 (07/18/2009), 139.0 (10/28/2008)  Problem # 2:  CHEST PAIN,  ATYPICAL (ICD-786.59) Pt having intermittent non exertional neck and shoulder pain.  I doubt anginal equivalent.  Prev stress testing normal.  EKG normal.   Pt advised to call office if symptoms get worse.  Orders: EKG w/ Interpretation (93000)  Problem # 3:  DIABETES MELLITUS, TYPE II, BORDERLINE (ICD-790.29) A1c stable at 6.2.  Maintain current medication regimen.  Her updated medication list for this problem includes:    Glucophage 500 Mg Tabs (Metformin hcl) ..... One by mouth bid  Labs Reviewed: Creat: 0.8 (07/18/2009)     Problem # 4:  GERD (ICD-530.81) Assessment: Unchanged she reports infreq use of NSAIDs  Her updated medication list for this problem includes:    Omeprazole 20 Mg Cpdr (Omeprazole)  ..... One by mouth two times a day  Complete Medication List: 1)  Omeprazole 20 Mg Cpdr (Omeprazole) .... One by mouth two times a day 2)  Xanax 0.5 Mg Tabs (Alprazolam) .... Take 1 tablet by mouth once a day at bedtime as needed 3)  Simvastatin 40 Mg Tabs (Simvastatin) .... One by mouth once daily 4)  Glucophage 500 Mg Tabs (Metformin hcl) .... One by mouth bid 5)  Nasacort Aq 55 Mcg/act Aers (Triamcinolone acetonide(nasal)) .... 2 sprays each nostril once daily 6)  Caltrate 600+d 600-400 Mg-unit Tabs (Calcium carbonate-vitamin d) .... Take 1 tablet by mouth once a day 7)  Multivitamins Tabs (Multiple vitamin) .... Take 1 tablet by mouth once a day 8)  Metronidazole 500 Mg Tabs (Metronidazole) .... Take 1 tablet by mouth two times a day x 14 days 9)  Etodolac 400 Mg Tabs (Etodolac) .... Take 1 tablet by mouth two times a day as needed  Patient Instructions: 1)  Please schedule a follow-up appointment in 6 months. 2)  Lipid Panel prior to visit, ICD-9: 272.4 3)  Please return for lab work within one month  Current Allergies (reviewed today): ! TYLOX   Preventive Care Screening  Mammogram:    Date:  06/07/2009    Results:  normal   Pap Smear:    Date:  06/07/2009    Results:  normal

## 2010-06-06 NOTE — Letter (Signed)
   [place] at [facility] [address] [place], [place]  [address]  [place] Phone: [phone]      April 05, 2008   Jamie [name] [address] [place] [address] [place] [place], [place] [address]  RE:  LAB RESULTS  Dear  [name],  The following is an interpretation of your most recent lab tests.  Please take note of any instructions provided or changes to medications that have resulted from your lab work.  LIPID PANEL:  Stable - no changes needed Triglyceride: 95   Cholesterol: 173   LDL: 111   HDL: 42.8   Chol/HDL%:  4.0 CALC   DIABETIC STUDIES:  Good - no changes needed Blood Glucose: 94   HgbA1C: 6.0          Sincerely Yours,    Dr. Thomos Lemons

## 2010-06-06 NOTE — Assessment & Plan Note (Signed)
Summary: 1 week follow up/mhf   Vital Signs:  Patient profile:   55 year old female Height:      62 inches Weight:      132.50 pounds BMI:     24.32 O2 Sat:      100 % on Room air Temp:     98.2 degrees F oral Pulse rate:   66 / minute Pulse rhythm:   regular Resp:     16 per minute BP sitting:   118 / 80  (left arm) Cuff size:   regular  Vitals Entered By: Glendell Docker CMA (Sep 06, 2009 2:20 PM)  O2 Flow:  Room air CC: Rm 3- 1 Week Follw up Comments still some fatigue, but feels much better   Primary Care Provider:  D. Thomos Lemons DO  CC:  Rm 3- 1 Week Follw up.  History of Present Illness: 55 y/o white female for f/u re:  acute gastroenteritis she was seen in ER - recevied IV fluids feeling much better,  back to normal diet  restarted her meds  Allergies: 1)  ! 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   Lungs:  normal respiratory effort, normal breath sounds, no crackles, and no wheezes.   Heart:  normal rate, regular rhythm, and no gallop.   Extremities:  No lower extremity edema  Psych:  normally interactive, good eye contact, not anxious appearing, and not depressed appearing.     Impression &  Recommendations:  Problem # 1:  GASTROENTERITIS, INFECTIOUS (ICD-009.0) Assessment Improved resolved with IV fluids and abx  Problem # 2:  DIABETES MELLITUS, TYPE II, BORDERLINE (ICD-790.29) DC metformin.  monitor A1c.  stressed importance of diet and exercise  The following medications were removed from the medication list:    Glucophage 500 Mg Tabs (Metformin hcl) ..... One by mouth bid  Future Orders: T- Hemoglobin A1C ([id number]) ... 12/29/2009  Complete Medication List: 1)  Omeprazole 20 Mg Cpdr (Omeprazole) .... One by mouth two times a day 2)  Xanax 0.5 Mg Tabs (Alprazolam) .... Take 1 tablet by mouth once a day at bedtime as needed 3)  Simvastatin 40 Mg Tabs (Simvastatin) .... One by mouth once daily 4)  Nasacort Aq 55 Mcg/act Aers (Triamcinolone acetonide(nasal)) .... 2 sprays each nostril once daily 5)  Caltrate 600+d 600-400 Mg-unit Tabs (Calcium carbonate-vitamin d) .... Take 1 tablet by mouth once a day 6)  Multivitamins Tabs (Multiple vitamin) .... Take 1 tablet by mouth once a day 7)  Etodolac 400 Mg Tabs (Etodolac) .... Take 1 tablet by mouth two times a day as needed  Other Orders: Future Orders: T-Basic Metabolic Panel [phone]) ... 12/29/2009  Patient  Instructions: 1)  Keep your next appointment  Current Allergies (reviewed today): ! TYLOX

## 2010-06-06 NOTE — Assessment & Plan Note (Signed)
Summary: 6 month follow up/mhf`resch per pt/dt   Vital Signs:  Patient profile:   55 year old female Height:      62 inches Weight:      134.50 pounds BMI:     24.69 O2 Sat:      100 % on Room air Temp:     98.0 degrees F rectal Pulse rate:   76 / minute Pulse rhythm:   regular Resp:     18 per minute BP sitting:   120 / 80  (right arm) Cuff size:   regular  Vitals Entered By: Glendell Docker CMA (January 30, 2010 4:12 PM)  O2 Flow:  Room air CC: 6 month follow up  Is Patient Diabetic? No Pain Assessment Patient in pain? no      Comments patient state she had colon prep prior to blood draw, request refill on Xanax   Primary Care Provider:  DThomos Lemons DO  CC:  6 month follow up .  History of Present Illness: 55 y/o white female for f/u recent blood test showed high K  not much alcohol  Current Diet Breakfast: fiber one bars  Lunch: grilled chicken salad,  dressing - thousand [place] or ranch DInner:  grilled or baked meat,  cococut shrimp,  occ steak.  bean Snacks: nuts Beverage: water and lemonade    Allergies: 1)  ! 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   Lungs:  normal respiratory effort,  normal breath sounds, no crackles, and no wheezes.   Heart:  normal rate, regular rhythm, and no gallop.     Impression & Recommendations:  Problem # 1:  HYPERLIPIDEMIA (ICD-272.4) Assessment Unchanged  Her updated medication list for this problem includes:    Simvastatin 40 Mg Tabs (Simvastatin) ..... One by mouth once daily  Orders: TLB-Lipid Panel (80061-LIPID) TLB-Hepatic/Liver Function Pnl (80076-HEPATIC)  Labs Reviewed: SGOT: 32 (07/18/2009)   SGPT: 38 (07/18/2009)   HDL:40.90 (07/18/2009), 37.60 (10/28/2008)  LDL:91 (10/28/2008), 111 (16/02/9603)  Chol:205 (07/18/2009), 156 (10/28/2008)  Trig:715.0 (07/18/2009), 139.0 (10/28/2008)  Problem # 2:  HYPERTENSION (ICD-401.9) slight elevation in K.  pt advised to avoid NSAIDs.  decrease intake of high K foods  Orders: TLB-BMP (Basic Metabolic Panel-BMET) (80048-METABOL)  BP today: 120/80 Prior BP: 118/80 (09/06/2009)  Labs Reviewed: K+: 5.9 (01/12/2010) Creat: : 0.8 (01/12/2010)   Chol: 205 (07/18/2009)   HDL: 40.90 (07/18/2009)   LDL: 91 (10/28/2008)   TG: 715.0 (07/18/2009)  Complete Medication List: 1)  Omeprazole 20 Mg Cpdr (Omeprazole) .... One by mouth two times a day 2)  Xanax 0.5 Mg Tabs (Alprazolam) .... Take 1 tablet by mouth once a  day at bedtime as needed 3)  Simvastatin 40 Mg Tabs (Simvastatin) .... One by mouth once daily 4)  Nasacort Aq 55 Mcg/act Aers (Triamcinolone acetonide(nasal)) .... 2 sprays each nostril once daily 5)  Caltrate 600+d 600-400 Mg-unit Tabs (Calcium carbonate-vitamin d) .... Take 1 tablet by mouth once a day 6)  Multivitamins Tabs (Multiple vitamin) .... Take 1 tablet by mouth once a day 7)  Etodolac 400 Mg Tabs (Etodolac) .... Take 1 tablet by mouth two times a day as needed  Patient Instructions: 1)  Please schedule a follow-up appointment in 6 months.  Prevention & Chronic Care Immunizations   Influenza vaccine: Declined  (01/30/2010)   Influenza vaccine deferral: patient  declined  (01/30/2010)    Tetanus booster: Not documented    Pneumococcal vaccine: Not documented  Colorectal Screening   Hemoccult: Not documented    Colonoscopy: DONE  (01/06/2010)   Colonoscopy due: 01/2015  Other Screening   Pap smear: normal  (06/07/2009)    Mammogram: normal  (06/07/2009)   Smoking status: never  (01/27/2007)  Lipids   Total Cholesterol: 205  (07/18/2009)   LDL: 91  (10/28/2008)   LDL Direct: 86.8  (07/18/2009)   HDL: 40.90  (07/18/2009)   Triglycerides: 715.0  (07/18/2009)    SGOT (AST): 32  (07/18/2009)   SGPT (ALT): 38  (07/18/2009)   Alkaline phosphatase: 70  (10/28/2008)   Total bilirubin: 0.8  (10/28/2008)  Hypertension   Last Blood Pressure: 120 / 80  (01/30/2010)   Serum creatinine: 0.8  (01/12/2010)   Serum potassium 5.9  (01/12/2010)  Self-Management Support :    Hypertension self-management support: Not documented    Lipid self-management support: Not documented    Current Allergies (reviewed today): ! TYLOX   Immunization History:  Influenza Immunization History:    Influenza:  declined (01/30/2010)   Contraindications/Deferment of Procedures/Staging:    Test/Procedure: FLU VAX    Reason for deferment: patient declined    Orders Added: 1)  TLB-Lipid Panel [80061-LIPID] 2)  TLB-Hepatic/Liver Function Pnl [80076-HEPATIC] 3)  TLB-BMP (Basic Metabolic Panel-BMET) [80048-METABOL] 4)  Est. Patient Level III [32440]

## 2010-06-06 NOTE — Assessment & Plan Note (Signed)
Summary: throwing up diarrhea since Fri 4.22.11/dt   Vital Signs:  Patient profile:   55 year old [name] Height:      62 inches Weight:      129.50 pounds BMI:     23.77 O2 Sat:      99 % on Room air Temp:     98.3 degrees F oral Pulse rate:   102 / minute Pulse rhythm:   regular Resp:     16 per minute BP sitting:   136 / 90  (right arm) Cuff size:   regular  Vitals Entered By: Glendell Docker CMA (August 29, 2009 3:32 PM)  O2 Flow:  Room air CC: Rm 3- does not feel good   Primary Care Provider:  DThomos Lemons DO  CC:  Rm 3- does not feel good.  History of Present Illness: Jamie [name] c/o intractale diarrrhea, nausea and weakness bad headache on Thursday night ate cheese burger.  felt bad after meal.   threw up Friday AM intractable diarrhea , 9-10 BMs watery in character,  no bloody stools feel nauseated ate peanut butter sandwich yest able to keep liquids down but persistent diarrhea  EKG  Procedure date:  08/29/2009  Findings:      Normal sinus rhythm with rate of:  93 bpm.  No ST changes  Allergies: 1)  ! 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   Head:   normocephalic and atraumatic.   Mouth:  Oral mucosa and oropharynx without lesions or exudates.  Teeth in good repair. Lungs:  normal respiratory effort and normal breath sounds.   Heart:  regular rhythm, no murmur, no gallop, and tachycardia.   Abdomen:  soft.  mild diffuse tenderness,  no rebound or guarding Extremities:  No lower extremity edema Neurologic:  cranial nerves II-XII intact and gait normal.   Skin:  pale Psych:  normally interactive and good eye contact.     Impression & Recommendations:  Problem # 1:  GASTROENTERITIS, INFECTIOUS (ICD-009.0) Pt with symptoms of acute gastroenteritis and dehydration.  arrange ER eval and IV fluids. empiric cipro and flagyl BRAT diet zofran for nausea hold metformin hold simvastatin hold nsaids  Complete Medication List: 1)  Omeprazole 20 Mg Cpdr (Omeprazole) .... One by mouth two times a day 2)  Xanax 0.5 Mg Tabs (Alprazolam) .... Take 1 tablet by mouth once a day at bedtime as needed 3)  Simvastatin 40 Mg Tabs (Simvastatin) .... One by mouth once daily 4)  Glucophage 500 Mg Tabs (Metformin hcl) .... One by mouth bid 5)  Nasacort Aq 55 Mcg/act Aers (Triamcinolone acetonide(nasal)) .... 2  sprays each nostril once daily 6)  Caltrate 600+d 600-400 Mg-unit Tabs (Calcium carbonate-vitamin d) .... Take 1 tablet by mouth once a day 7)  Multivitamins Tabs (Multiple vitamin) .... Take 1 tablet by mouth once a day 8)  Etodolac 400 Mg Tabs (Etodolac) .... Take 1 tablet by mouth two times a day as needed 9)  Ondansetron Hcl 4 Mg Tabs (Ondansetron hcl) .... One by mouth three times a day as needed nausea 10)  Ciprofloxacin Hcl 250 Mg Tabs (Ciprofloxacin hcl) .... One by mouth two times a day 11)  Metronidazole 500 Mg Tabs (Metronidazole) .... One by mouth three times a day  Patient Instructions: 1)  I advise ER evaluation for diarrhea , nausea and dehydration 2)  Lab orders: 3)  CMET, Lipase, u/a, cardiac enzymes 4)  Follow BRAT diet as  instructed 5)  Hold all meds except omeprazole and alprazolam 6)  Please schedule a follow-up appointment in 1 week 7)  Call our office if your symptoms do not  improve or gets worse. Prescriptions: METRONIDAZOLE 500 MG TABS (METRONIDAZOLE) one by mouth three times a day  #21 x 0   Entered and Authorized by:   D. Thomos Lemons DO   Signed by:   D. Thomos Lemons DO on 08/29/2009   Method used:   Print then Give to Patient   RxID:   [id number] CIPROFLOXACIN HCL 250 MG TABS (CIPROFLOXACIN HCL) one by mouth two times a day  #14 x 0   Entered and Authorized by:   D. Thomos Lemons DO   Signed by:   D. Thomos Lemons DO on 08/29/2009   Method used:   Print then Give to Patient   RxID:   [id number] ONDANSETRON HCL 4 MG TABS (ONDANSETRON HCL) one by mouth three times a day as needed nausea  #30 x 0   Entered and Authorized by:   D. Thomos Lemons DO   Signed by:   D. Thomos Lemons DO on 08/29/2009   Method used:   Print then Give to Patient   RxID:   [id number]   Current Allergies (reviewed today): ! TYLOX

## 2010-06-06 NOTE — Miscellaneous (Signed)
Summary: DIR COL SCR-AGE.Marland KitchenEM  Clinical Lists Changes  Medications: Added new medication of MOVIPREP 100 GM  SOLR (PEG-KCL-NACL-NASULF-NA ASC-C) as directed - Signed Rx of MOVIPREP 100 GM  SOLR (PEG-KCL-NACL-NASULF-NA ASC-C) as directed;  #1 x 0;  Signed;  Entered by: Darcey Nora RN;  Authorized by: Rachael Fee MD;  Method used: Electronically to [facility] [phone]*, [address] [place], [place], [place]  [address], Ph: [phone] or [phone], Fax: [phone]    Prescriptions: MOVIPREP 100 GM  SOLR (PEG-KCL-NACL-NASULF-NA ASC-C) as directed  #1 x 0   Entered by:   Darcey Nora RN   Authorized by:   Rachael Fee MD   Signed by:   Darcey Nora RN on 07/09/2008   Method used:   Electronically to        [facility] (retail)       [address]       [place], [place]  [address]       Ph: [phone] or [phone]       Fax: [phone]   RxID:   [id number]

## 2010-06-06 NOTE — Assessment & Plan Note (Signed)
Summary: F/U LIPID MANAGEMENT/DK   Vital Signs:  Patient Profile:   55 Years Old Female Height:     62 inches Weight:      135.50 pounds BMI:     24.87 Temp:     97.5 degrees F oral Pulse rate:   68 / minute Pulse rhythm:   regular Resp:     18 per minute BP sitting:   112 / 75  (left arm) Cuff size:   regular  Vitals Entered By: Glendell Docker CMA (March 29, 2008 3:12 PM)                 PCP:  Dondra Spry DO  Chief Complaint:  Follow up.  History of Present Illness:  Hyperlipidemia Follow-Up      This is a 55 year old woman who presents for Hyperlipidemia follow-up.  The patient denies muscle aches and GI upset.  The patient denies the following symptoms: chest pain/pressure.  Compliance with medications (by patient report) has been near 100%.  Dietary compliance has been fair.  The patient reports exercising occasionally.  Adjunctive measures currently used by the patient include fish oil supplements.      Current Allergies (reviewed today): ! TYLOX ! GLUCOPHAGE  Past Medical History:    GERD    Hyperlipidemia    Hypertension    Hx of Cervical Dysplasia    Hx of Gastric Ulcers    Allergic rhinitis - (Allergy shots x 2 yrs)   Past Surgical History:    Cervical fusion - 09/2003    Lumbar fusion - 11/1998    Cervical cryosurgery 02/2003        Family History:    Family History of Arthritis    Family History of CAD Female 1st degree relative <60    Family History Diabetes 1st degree relative    Family History Lung cancer    Family History of Prostate CA 1st degree relative <50    Family History of Stroke F 1st degree relative <60   Social History:    Occupation:  Receptionist    Married for 30 years    Never Smoked    Alcohol use-no    Regular exercise-yes    One step son     One Daughter     Review of Systems      See HPI   Physical Exam  General:     alert, well-developed, and well-nourished.   Head:      normocephalic and atraumatic.   Eyes:     vision grossly intact, pupils equal, pupils round, and pupils reactive to light.   Neck:     supple and no masses.  no carotid bruits.   Lungs:     normal respiratory effort and normal breath sounds.   Heart:     normal rate, regular rhythm, no murmur, and no gallop.   Abdomen:     soft and non-tender.   Extremities:     No lower extremity edema  Neurologic:     cranial nerves II-XII intact and gait normal.   Psych:     normally interactive and good eye contact.      Impression & Recommendations:  Problem # 1:  HYPERLIPIDEMIA (ICD-272.4) Pt tolerating simvastatin.   Arrang f/u labs.  Her updated medication list for this problem includes:    Simvastatin 40 Mg Tabs (Simvastatin) ..... One by mouth once daily  Labs Reviewed: Chol: 171 (07/15/2007)   HDL: 30.6 (07/15/2007)  LDL: 81.0 (07/15/2007)   TG: 336 (07/15/2007) SGOT: 28 (07/22/2007)   SGPT: 44 (07/22/2007)   Problem # 2:  GERD (ICD-530.81) Assessment: Improved Pt still uses NSAIDs occasionally but reflux significantly improved since starting PPI.  Maintain current medication regimen.   Her updated medication list for this problem includes:    Omeprazole 20 Mg Cpdr (Omeprazole) ..... One by mouth two times a day   Problem # 3:  DIABETES MELLITUS, TYPE II, BORDERLINE (ICD-790.29) Monitor A1c.  Problem # 4:  HEALTH MAINTENANCE EXAM (ICD-V70.0) Refer for screening colonoscopy Orders: Gastroenterology Referral (GI)   Complete Medication List: 1)  Omeprazole 20 Mg Cpdr (Omeprazole) .... One by mouth two times a day 2)  Angeliq 0.5-1 Mg Tabs (Drospirenone-estradiol) .... One by mouth qd 3)  Xanax 0.5 Mg Tabs (Alprazolam) .... Take 1 tablet by mouth once a day at bedtime as needed 4)  Simvastatin 40 Mg Tabs (Simvastatin) .... One by mouth once daily   Patient Instructions: 1)  Please schedule a follow-up appointment in 6 months.  2)  AST, ALT  prior to visit, ICD-9:  272.4 3)  Lipid Panel prior to visit, ICD-9:  272.4 4)  HbgA1C prior to visit, ICD-9: 790.29 5)  Please return for lab work within one week.   Prescriptions: OMEPRAZOLE 20 MG  CPDR (OMEPRAZOLE) one by mouth two times a day  #60 x 5   Entered and Authorized by:   D. Thomos Lemons DO   Signed by:   D. Thomos Lemons DO on 03/29/2008   Method used:   Electronically to        [facility] (retail)       [address]       [place], [place]  [address]       Ph: [phone] or [phone]       Fax: [phone]   RxID:   [id number] SIMVASTATIN 40 MG  TABS (SIMVASTATIN) one by mouth once daily  #30 x 5   Entered and Authorized by:   D. Thomos Lemons DO   Signed by:   D. Thomos Lemons DO on 03/29/2008   Method used:   Electronically to        [facility] (retail)       [address]       [place], [place]  [address]       Ph: [phone] or [phone]       Fax: [phone]   RxID:   [id number]  ] Current Allergies (reviewed today): ! TYLOX ! GLUCOPHAGE

## 2010-06-06 NOTE — Assessment & Plan Note (Signed)
Summary: 3 mo. f/u - jr   Vital Signs:  Patient profile:   55 year old female Weight:      137.75 pounds BMI:     25.29 Temp:     97.5 degrees F oral Pulse rate:   72 / minute Pulse rhythm:   regular Resp:     16 per minute BP sitting:   130 / 70  (right arm) Cuff size:   regular  Vitals Entered By: Glendell Docker CMA (January 24, 2009 2:27 PM)  Primary Care Provider:  Dondra Spry DO  CC:  3 Month Follow up disease management.  History of Present Illness: 3 month follow up  disease management  Prediabetes - doing well on glucophage.  no side effects.  started walking program 3 x per week (30 mins).  She is drinking more water and no soft drinks.  feels better.  Hyperlipidemia - stable  right ear pain - occ dizziness. Hx of allergies.  Stopped allergy shots.   Allergies: 1)  ! Tylox  Past History:  Past Medical History: GERD Hyperlipidemia Hypertension  Hx of Cervical Dysplasia  Hx of Gastric Ulcers Allergic rhinitis - (Allergy shots x 2 yrs)   Past Surgical History: Cervical fusion - 09/2003  Lumbar fusion - 11/1998 Cervical cryosurgery 02/2003     Family History: Family History of Arthritis Family History of CAD Female 1st degree relative <60 Family History Diabetes 1st degree relative Family History Lung cancer Family History of Prostate CA 1st degree relative <50 Family History of Stroke F 1st degree relative <60      Social History: Occupation:  Receptionist Married for 30 years Never Smoked Alcohol use-no Regular exercise-yes One step son  One  Daughter   Physical Exam  General:  alert, well-developed, and well-nourished.   Ears:  R ear slightly retracted and L ear normal.   Mouth:  Oral mucosa and oropharynx without lesions or exudates.   Neck:  supple and no masses.  no carotid bruits.   Lungs:  normal respiratory effort and normal breath sounds.   Heart:  normal rate, regular rhythm, and no gallop.    Extremities:  No lower extremity edema    Impression & Recommendations:  Problem # 1:  DIABETES MELLITUS, TYPE II, BORDERLINE (ICD-790.29) Assessment Improved A1c improved.  She has started walking program 3 x week.   Pt advised to increase walking program to 4-5 x per week.  Maintain current medication regimen.  Her updated medication list for this problem includes:    Glucophage 500 Mg Tabs (Metformin hcl) ..... One by mouth bid  Problem # 2:  HYPERLIPIDEMIA (ICD-272.4) Tolerating simvastatin.   Monitor labs before next OV.  Labs Reviewed: K+: 5.0 (01/17/2009) Creat: : 0.8 (01/17/2009)   Chol: 156 (10/28/2008)   HDL: 37.60 (10/28/2008)   LDL: 91 (10/28/2008)   TG: 139.0 (10/28/2008)  Her updated medication list for this problem includes:    Simvastatin 40 Mg Tabs (Simvastatin) ..... One by mouth once daily  Labs Reviewed: SGOT: 18 (10/28/2008)   SGPT: 20 (10/28/2008)   HDL:37.60 (10/28/2008), 42.8 (04/05/2008)  LDL:91 (10/28/2008), 111 (27/10/2374)  Chol:156 (10/28/2008), 173 (04/05/2008)  Trig:139.0 (10/28/2008), 95 (04/05/2008)  Problem # 3:  ALLERGIC RHINITIS (ICD-477.9)  Her updated medication list for this problem includes:    Nasacort Aq 55 Mcg/act Aers (Triamcinolone acetonide(nasal)) .Marland Kitchen... 2 sprays each nostril once daily  Complete Medication List: 1)  Omeprazole 20 Mg Cpdr (Omeprazole) .... One by mouth two times a day  2)  Xanax 0.5 Mg Tabs (Alprazolam) .... Take 1 tablet by mouth once a day at bedtime as needed 3)  Simvastatin 40 Mg Tabs (Simvastatin) .... One by mouth once daily 4)  Glucophage 500 Mg Tabs (Metformin hcl) .... One by mouth bid 5)  Nasacort Aq 55 Mcg/act Aers (Triamcinolone acetonide(nasal)) .... 2 sprays each nostril once daily  Patient Instructions: 1)  Arrange office visit in 6 months.  2)  BMET:  790.29 3)  HgA1c:  790.29 4)  FLP, AST, ALT, TSH - 272.4 5)  Fasting blood work one week before office visit. Prescriptions:  SIMVASTATIN 40 MG  TABS (SIMVASTATIN) one by mouth once daily  #30 x 5   Entered and Authorized by:   D. Thomos Lemons DO   Signed by:   D. Thomos Lemons DO on 01/24/2009   Method used:   Electronically to        [facility] (retail)       [address]       [place], [place]  [address]       Ph: [phone] or [phone]       Fax: [phone]   RxID:   [id number] GLUCOPHAGE 500 MG TABS (METFORMIN HCL) one by mouth bid  #60 x 5   Entered and Authorized by:   D. Thomos Lemons DO   Signed by:   D. Thomos Lemons DO on 01/24/2009   Method used:   Electronically to        [facility] (retail)       [address]       [place], [place]  [address]       Ph: [phone] or [phone]       Fax: [phone]   RxID:   [id number] NASACORT AQ 55 MCG/ACT AERS (TRIAMCINOLONE ACETONIDE(NASAL)) 2 sprays each nostril once daily  #1 x 3   Entered and Authorized by:   D. Thomos Lemons DO   Signed by:   D. Thomos Lemons DO on 01/24/2009   Method used:   Electronically to        [facility] (retail)       [address]       [place], [place]  [address]       Ph: [phone] or [phone]       Fax: [phone]   RxID:   [id number] XANAX 0.5 MG  TABS (ALPRAZOLAM) Take 1 tablet by mouth once a day at bedtime as needed  #30 x 5   Entered and Authorized by:   D. Thomos Lemons DO   Signed by:   D. Thomos Lemons DO on 01/24/2009   Method used:   Print then Give to Patient   RxID:   [id number]   Current Allergies (reviewed today): ! TYLOX    Preventive Care Screening  Pap Smear:    Date:  11/23/2008    Results:  normal   Mammogram:    Date:  04/20/2008    Results:  normal     Patient Instructions: 1)  Arrange office visit in 6 months.  2)  BMET:  790.29 3)  HgA1c:  790.29 4)  FLP, AST, ALT, TSH - 272.4 5)  Fasting blood work one week before office visit.

## 2010-06-06 NOTE — Progress Notes (Signed)
Summary: Lab Results  Phone Note Outgoing Call   Summary of Call: call pt - potassium level is slightly elevated.  taking NSAIDs such as etodalac can increase K levels.  if pt taking nsaids, I suggest she stop also increase fluids intake and reduce intake of high foods x 1-2 wks Initial call taken by: D. Thomos Lemons DO,  January 12, 2010 1:52 PM  Follow-up for Phone Call        attempted to contact patient at [phone], no answer. A voice message was left for patient to return call. Follow-up by: Glendell Docker CMA,  January 12, 2010 3:09 PM  Additional Follow-up for Phone Call Additional follow up Details #1::        Left message on machine to return my call. Nicki Guadalajara Fergerson CMA Duncan Dull)  January 13, 2010 8:07 AM     Additional Follow-up for Phone Call Additional follow up Details #2::    call returned by patient she asked to be contacted at  [phone], no answer. A detaoiled voice message was left informing  patient  per Dr Artist Pais instructions. She was advised to call back if any questions.  Follow-up by: Glendell Docker CMA,  January 13, 2010 4:45 PM

## 2010-06-06 NOTE — Letter (Signed)
Summary: [facility] Instructions  [facility]  [address] [place], [place] [address]   Phone: [phone]  Fax: [phone]       EMMELINA MCLOUGHLIN    Jul 28, 1955    MRN: [id number]        Procedure Day /Date:  Friday 01/06/2010     Arrival Time: 8:00 am      Procedure Time: 9:00 am     Location of Procedure:                    _x _  [facility] (4th Floor)                        PREPARATION FOR COLONOSCOPY WITH MOVIPREP   Starting 5 days prior to your procedure Sunday 8/28 do not eat nuts, seeds, popcorn, corn, beans, peas,  salads, or any raw vegetables.  Do not take any fiber supplements (e.g. Metamucil, Citrucel, and Benefiber).  THE DAY BEFORE YOUR PROCEDURE         DATE: Thursday 9/1  1.  Drink clear liquids the entire day-NO SOLID FOOD  2.  Do not drink anything colored red or purple.  Avoid juices with pulp.  No orange juice.  3.  Drink at least 64 oz. (8 glasses) of fluid/clear liquids during the day to prevent dehydration and help the prep work efficiently.  CLEAR LIQUIDS INCLUDE: Water Jello Ice Popsicles Tea (sugar ok, no milk/cream) Powdered fruit flavored drinks Coffee (sugar ok, no milk/cream) Gatorade Juice: apple, white grape, white cranberry  Lemonade Clear bullion, consomm, broth Carbonated beverages (any kind) Strained chicken noodle soup Hard Candy                             4.  In the morning, mix first dose of MoviPrep solution:    Empty 1 Pouch A and 1 Pouch B into the disposable container    Add lukewarm drinking water to the top line of the container. Mix to dissolve    Refrigerate (mixed solution should be used within 24 hrs)  5.  Begin drinking the prep at 5:00 p.m. The MoviPrep container is divided by 4 marks.   Every 15 minutes drink the solution down to the next mark (approximately 8 oz) until the full liter is complete.   6.  Follow completed prep with 16 oz of clear liquid of your choice (Nothing  red or purple).  Continue to drink clear liquids until bedtime.  7.  Before going to bed, mix second dose of MoviPrep solution:    Empty 1 Pouch A and 1 Pouch B into the disposable container    Add lukewarm drinking water to the top line of the container. Mix to dissolve    Refrigerate  THE DAY OF YOUR PROCEDURE      DATE: Friday 9/2  Beginning at 4:00 a.m. (5 hours before procedure):         1. Every 15 minutes, drink the solution down to the next mark (approx 8 oz) until the full liter is complete.  2. Follow completed prep with 16 oz. of clear liquid of your choice.    3. You may drink clear liquids until 7:00 am (2 HOURS BEFORE PROCEDURE).   MEDICATION INSTRUCTIONS  Unless otherwise instructed, you should take regular prescription medications with a small sip of water   as early as possible the morning of  your procedure.   Additional medication instructions: n/a         OTHER INSTRUCTIONS  You will need a responsible adult at least 56 years of age to accompany you and drive you home.   This person must remain in the waiting room during your procedure.  Wear loose fitting clothing that is easily removed.  Leave jewelry and other valuables at home.  However, you may wish to bring a book to read or  an iPod/MP3 player to listen to music as you wait for your procedure to start.  Remove all body piercing jewelry and leave at home.  Total time from sign-in until discharge is approximately 2-3 hours.  You should go home directly after your procedure and rest.  You can resume normal activities the  day after your procedure.  The day of your procedure you should not:   Drive   Make legal decisions   Operate machinery   Drink alcohol   Return to work  You will receive specific instructions about eating, activities and medications before you leave.    The above instructions have been reviewed and explained to me by   Sherren Kerns RN  December 23, 2009 8:27  AM    I fully understand and can verbalize these instructions _____________________________ Date _________

## 2010-06-06 NOTE — Letter (Signed)
Summary: Results Letter  [facility]  [address] [place], [place] [address]   Phone: [phone]  Fax: [phone]        January 13, 2010 MRN: [id number]    VESPER TRANT [address] [place], [place]  [address]    Dear Ms. Sulton,   The polyps removed during your recent procedure were both proven to be adenomatous.  These are pre-cancerous polyps that may have grown into cancers if they had not been removed.  Based on current nationally recognized surveillance guidelines, I recommend that you have a repeat colonoscopy in 5 years.  We will therefore put your information in our reminder system and will contact you in 5 years to schedule a repeat procedure.  Please call if you have any questions or concerns.       Sincerely,  Rachael Fee MD  This letter has been electronically signed by your physician.  Appended Document: Results Letter letter mailed

## 2010-06-09 NOTE — Medication Information (Signed)
Summary: Possible Nonadherence with Simvastatin/[employer]  Possible Nonadherence with Simvastatin/[employer]   Imported By: Lanelle Bal 05/18/2009 11:52:28  _____________________________________________________________________  External Attachment:    Type:   Image     Comment:   External Document

## 2010-06-23 ENCOUNTER — Telehealth: Payer: Self-pay | Admitting: Internal Medicine

## 2010-06-28 NOTE — Progress Notes (Signed)
Summary: Lab Orders  Phone Note Call from Patient   Caller: Patient Complaint: Breathing Problems Details for Reason: Labs Summary of Call: Pt is scheduled to see Dr Artist Pais March 27 for 6 month follow up she wants to know if she need labs, going to [place]   phone  [phone]  after 3pm cell [phone]   Initial call taken by: Darral Dash,  June 23, 2010 8:19 AM  Follow-up for Phone Call        BMP prior to visit, ICD-9:  790.29 Hepatic Panel prior to visit, ICD-9:  272.4 Lipid Panel prior to visit, ICD-9:  272.4 TSH prior to visit, ICD-9: 272.4 HbgA1C prior to visit, ICD-9: 790.29   Follow-up by: D. Thomos Lemons DO,  June 23, 2010 12:50 PM  Additional Follow-up for Phone Call Additional follow up Details #1::        Lab orders entered for [name] for the week of March 5th.Call was placed to patient at [phone], she  was informed of fasting blood work prior to office visit Additional Follow-up by: Glendell Docker CMA,  June 23, 2010 4:03 PM

## 2010-07-10 ENCOUNTER — Other Ambulatory Visit: Payer: Self-pay

## 2010-07-25 ENCOUNTER — Ambulatory Visit: Payer: 59

## 2010-07-25 DIAGNOSIS — E785 Hyperlipidemia, unspecified: Secondary | ICD-10-CM

## 2010-07-25 DIAGNOSIS — R7309 Other abnormal glucose: Secondary | ICD-10-CM

## 2010-07-25 LAB — BASIC METABOLIC PANEL
BUN: 14 mg/dL (ref 6–23)
CO2: 30 mEq/L (ref 19–32)
Calcium: 9.4 mg/dL (ref 8.4–10.5)
Chloride: 108 mEq/L (ref 96–112)
Creatinine, Ser: 0.8 mg/dL (ref 0.4–1.2)
GFR: 81.38 mL/min (ref >60.00)
Glucose, Bld: 118 mg/dL — ABNORMAL HIGH (ref 70–99)
Potassium: 4.6 mEq/L (ref 3.5–5.1)
Sodium: 143 mEq/L (ref 135–145)

## 2010-07-25 LAB — CBC
HCT: 50 % — ABNORMAL HIGH (ref 36.0–46.0)
Hemoglobin: 16.7 g/dL — ABNORMAL HIGH (ref 12.0–15.0)
MCHC: 33.4 g/dL (ref 30.0–36.0)
MCV: 87.8 fL (ref 78.0–100.0)
Platelets: 281 10*3/uL (ref 150–400)
RBC: 5.7 MIL/uL — ABNORMAL HIGH (ref 3.87–5.11)
RDW: 12.2 % (ref 11.5–15.5)
WBC: 7.7 10*3/uL (ref 4.0–10.5)

## 2010-07-25 LAB — HEPATIC FUNCTION PANEL
ALT: 49 U/L — ABNORMAL HIGH (ref 0–35)
AST: 29 U/L (ref 0–37)
Albumin: 4.1 g/dL (ref 3.5–5.2)
Alkaline Phosphatase: 76 U/L (ref 39–117)
Bilirubin, Direct: 0.1 mg/dL (ref 0.0–0.3)
Total Bilirubin: 0.7 mg/dL (ref 0.3–1.2)
Total Protein: 6.2 g/dL (ref 6.0–8.3)

## 2010-07-25 LAB — DIFFERENTIAL
Basophils Absolute: 0.1 10*3/uL (ref 0.0–0.1)
Basophils Relative: 1 % (ref 0–1)
Eosinophils Absolute: 0.2 10*3/uL (ref 0.0–0.7)
Eosinophils Relative: 2 % (ref 0–5)
Lymphocytes Relative: 25 % (ref 12–46)
Lymphs Abs: 1.9 10*3/uL (ref 0.7–4.0)
Monocytes Absolute: 0.5 10*3/uL (ref 0.1–1.0)
Monocytes Relative: 7 % (ref 3–12)
Neutro Abs: 5 10*3/uL (ref 1.7–7.7)
Neutrophils Relative %: 65 % (ref 43–77)

## 2010-07-25 LAB — URINALYSIS, ROUTINE W REFLEX MICROSCOPIC
Glucose, UA: NEGATIVE mg/dL
Hgb urine dipstick: NEGATIVE
Ketones, ur: 15 mg/dL — AB
Nitrite: NEGATIVE
Protein, ur: NEGATIVE mg/dL
Specific Gravity, Urine: 1.025 (ref 1.005–1.030)
Urobilinogen, UA: 0.2 mg/dL (ref 0.0–1.0)
pH: 5.5 (ref 5.0–8.0)

## 2010-07-25 LAB — GLUCOSE, CAPILLARY: Glucose-Capillary: 128 mg/dL — ABNORMAL HIGH (ref 70–99)

## 2010-07-25 LAB — COMPREHENSIVE METABOLIC PANEL
ALT: 89 U/L — ABNORMAL HIGH (ref 0–35)
AST: 37 U/L (ref 0–37)
Albumin: 4.8 g/dL (ref 3.5–5.2)
Alkaline Phosphatase: 115 U/L (ref 39–117)
BUN: 16 mg/dL (ref 6–23)
CO2: 22 mEq/L (ref 19–32)
Calcium: 9.9 mg/dL (ref 8.4–10.5)
Chloride: 106 mEq/L (ref 96–112)
Creatinine, Ser: 0.9 mg/dL (ref 0.4–1.2)
GFR calc Af Amer: >60 mL/min (ref >60)
GFR calc non Af Amer: >60 mL/min (ref >60)
Glucose, Bld: 116 mg/dL — ABNORMAL HIGH (ref 70–99)
Potassium: 4.5 mEq/L (ref 3.5–5.1)
Sodium: 143 mEq/L (ref 135–145)
Total Bilirubin: 0.7 mg/dL (ref 0.3–1.2)
Total Protein: 8 g/dL (ref 6.0–8.3)

## 2010-07-25 LAB — LIPID PANEL
Cholesterol: 239 mg/dL — ABNORMAL HIGH (ref 0–200)
HDL: 41.4 mg/dL (ref >39.00)
Total CHOL/HDL Ratio: 6
Triglycerides: 451 mg/dL — ABNORMAL HIGH (ref 0.0–149.0)
VLDL: 90.2 mg/dL — ABNORMAL HIGH (ref 0.0–40.0)

## 2010-07-25 LAB — LDL CHOLESTEROL, DIRECT: Direct LDL: 108.1 mg/dL

## 2010-07-25 LAB — TSH: TSH: 2.2 u[IU]/mL (ref 0.35–5.50)

## 2010-07-25 LAB — AMYLASE: Amylase: 48 U/L (ref 0–105)

## 2010-07-25 LAB — LIPASE, BLOOD: Lipase: 74 U/L (ref 23–300)

## 2010-07-25 LAB — HEMOGLOBIN A1C: Hgb A1c MFr Bld: 6.5 % (ref 4.6–6.5)

## 2010-08-01 ENCOUNTER — Ambulatory Visit: Payer: Self-pay | Admitting: Internal Medicine

## 2010-08-03 ENCOUNTER — Ambulatory Visit (INDEPENDENT_AMBULATORY_CARE_PROVIDER_SITE_OTHER): Payer: 59 | Admitting: Internal Medicine

## 2010-08-03 ENCOUNTER — Encounter: Payer: Self-pay | Admitting: Internal Medicine

## 2010-08-03 VITALS — BP 130/80 | HR 73 | Temp 98.3°F | Resp 18 | Ht 62.0 in | Wt 146.0 lb

## 2010-08-03 DIAGNOSIS — E785 Hyperlipidemia, unspecified: Secondary | ICD-10-CM

## 2010-08-03 DIAGNOSIS — M25579 Pain in unspecified ankle and joints of unspecified foot: Secondary | ICD-10-CM

## 2010-08-03 DIAGNOSIS — E119 Type 2 diabetes mellitus without complications: Secondary | ICD-10-CM

## 2010-08-03 DIAGNOSIS — M25571 Pain in right ankle and joints of right foot: Secondary | ICD-10-CM | POA: Insufficient documentation

## 2010-08-03 DIAGNOSIS — R7401 Elevation of levels of liver transaminase levels: Secondary | ICD-10-CM

## 2010-08-03 DIAGNOSIS — R7402 Elevation of levels of lactic acid dehydrogenase (LDH): Secondary | ICD-10-CM | POA: Insufficient documentation

## 2010-08-03 DIAGNOSIS — B353 Tinea pedis: Secondary | ICD-10-CM

## 2010-08-03 MED ORDER — METFORMIN HCL 500 MG PO TABS: 500.0000 mg | ORAL_TABLET | Freq: Two times a day (BID) | ORAL | Status: DC

## 2010-08-03 MED ORDER — SIMVASTATIN 20 MG PO TABS: 20.0000 mg | ORAL_TABLET | Freq: Every day | ORAL | Status: DC

## 2010-08-03 MED ORDER — ALPRAZOLAM 0.5 MG PO TABS: 0.5000 mg | ORAL_TABLET | Freq: Every evening | ORAL | Status: DC | PRN

## 2010-08-03 NOTE — Assessment & Plan Note (Signed)
LDL stable.  Triglycerides still elevated.  It likely reflects DM II.   Pt to follow low carb diet Consider add fenofibrate

## 2010-08-03 NOTE — Patient Instructions (Signed)
Limit your carbohydrates to 25 grams per meal Our office will contact you re:  Diabetic educator referral

## 2010-08-03 NOTE — Progress Notes (Signed)
  Subjective:    Patient ID: Jamie [name], female    DOB: 05-29-1955, 55 y.o.   MRN: [id number]  HPI 55 y/o female c/o right ankle pain.  No specific trigger.  Denies redness or edema. Bottom of right foot is scaly.    Hyperlipidemia - lab results reviewed.  Pt frustrated with wt gain.  Triglycerides still elevated.  Abnormal glucose - A1c elevated at 6.5.  Pt not following low carb diet.  She did give up her soft drinks  Past Medical History  Diagnosis Date  . GERD (gastroesophageal reflux disease)   . Hypertension   . History of cervical dysplasia   . Ulcer     history of gastritis  . Allergy   . Hyperlipidemia    History   Social History  . Marital Status: Married    Spouse Name: N/A    Number of Children: 1  . 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 Smoking status: Never Smoker   . Smokeless tobacco: Not on file  . Alcohol Use: No  . Drug Use:   . Sexually Active:    Other Topics Concern  . Not on file   Social History Narrative   1 step son Regular exercise- (walking)      Review of Systems  Constitutional: Positive for fatigue.  Cardiovascular: Negative for chest pain and leg swelling.       Objective:   Physical Exam  Constitutional: She appears well-developed and well-nourished. No distress.  HENT:  Head: Normocephalic and atraumatic.  Neck: Normal range of motion. Neck supple.  Cardiovascular: Normal rate, regular rhythm and normal heart sounds.   Pulmonary/Chest: Effort normal and breath sounds normal. She has no wheezes.  Musculoskeletal:       Mild right ankle edema.  No joint redness or instablity  Skin:       Scaly skin bottom of right foot         Assessment & Plan:

## 2010-08-03 NOTE — Assessment & Plan Note (Signed)
Lab Results  Component Value Date   ALT 49* 07/25/2010   AST 29 07/25/2010   ALKPHOS 76 07/25/2010   BILITOT 0.7 07/25/2010   Mild ALT elevation likely due to statin.  However considering diabetes, screen for hemochromatosis

## 2010-08-03 NOTE — Assessment & Plan Note (Signed)
A1c 6.5 Refer to diabetic educator. Start metformin We discussed common side effects Continue walking program  Carb counting handout provided

## 2010-08-03 NOTE — Assessment & Plan Note (Signed)
Mild swelling. Probable DJD.  Use tylenol as needed

## 2010-08-03 NOTE — Assessment & Plan Note (Signed)
Tinea pedis of right foot Pt advised to use OTC anti fungal cream bid x 2 weeks

## 2010-08-07 ENCOUNTER — Other Ambulatory Visit: Payer: Self-pay | Admitting: Internal Medicine

## 2010-08-07 DIAGNOSIS — E785 Hyperlipidemia, unspecified: Secondary | ICD-10-CM

## 2010-08-07 DIAGNOSIS — R7401 Elevation of levels of liver transaminase levels: Secondary | ICD-10-CM

## 2010-08-07 DIAGNOSIS — I10 Essential (primary) hypertension: Secondary | ICD-10-CM

## 2010-08-07 DIAGNOSIS — E119 Type 2 diabetes mellitus without complications: Secondary | ICD-10-CM

## 2010-08-09 ENCOUNTER — Other Ambulatory Visit: Payer: Self-pay | Admitting: Internal Medicine

## 2010-08-09 DIAGNOSIS — E119 Type 2 diabetes mellitus without complications: Secondary | ICD-10-CM

## 2010-08-21 ENCOUNTER — Encounter: Payer: 59 | Attending: Internal Medicine

## 2010-08-21 DIAGNOSIS — E119 Type 2 diabetes mellitus without complications: Secondary | ICD-10-CM | POA: Insufficient documentation

## 2010-08-21 DIAGNOSIS — Z713 Dietary counseling and surveillance: Secondary | ICD-10-CM | POA: Insufficient documentation

## 2010-08-22 LAB — HM DIABETES EYE EXAM

## 2010-09-21 ENCOUNTER — Other Ambulatory Visit: Payer: Self-pay | Admitting: Internal Medicine

## 2010-09-21 NOTE — Telephone Encounter (Signed)
Rx refill sent to pharmacy. 

## 2010-09-22 ENCOUNTER — Encounter: Payer: Self-pay | Admitting: Internal Medicine

## 2010-09-22 NOTE — Op Note (Signed)
NAME:  Jamie [name], Jamie [name]                      ACCOUNT NO.:  [id number]   MEDICAL RECORD NO.:  [id number]                   PATIENT TYPE:  OIB   LOCATION:  5003                                 FACILITY:  [facility]   PHYSICIAN:  Sharolyn Douglas, M.D.                     DATE OF BIRTH:  03-Oct-1955   DATE OF PROCEDURE:  10/27/2003  DATE OF DISCHARGE:  10/28/2003                                 OPERATIVE REPORT   PREOPERATIVE DIAGNOSIS:  Cervical spondylotic radiculopathy and herniated  nucleus pulposus, C6-7.   POSTOPERATIVE DIAGNOSIS:  Cervical spondylotic radiculopathy and herniated  nucleus pulposus, C6-7.   OPERATION PERFORMED:  1. Anterior cervical diskectomy, C6-7 decompression of the spinal cord and     nerve roots bilaterally.  2. Anterior cervical arthrodesis, C6-7 with placement of 7 mm allograft     prosthesis spacer packed with local autogenous bone graft.  3. Anterior cervical plating, C6-7 utilizing the [employer] system.   SURGEON:  Sharolyn Douglas, M.D.   ASSISTANT:  Verlin Fester, P.A.   ANESTHESIA:  General endotracheal.   COMPLICATIONS:  None.   INDICATIONS FOR PROCEDURE:  The patient is a pleasant 55 year old female  with severe intractable neck and right upper extremity radiculopathy thought  to be secondary to cervical spondylosis at C6-7 with focal disk herniation  and C7 nerve root compression.  The risks, benefits, and alternatives to  ACDF were discussed and she elected to proceed.   DESCRIPTION OF PROCEDURE:  The patient was properly identified in the  holding area and taken to the operating room.  She underwent general  endotracheal anesthesia without difficulty, given prophylactic intravenous  antibiotics.  She was carefully positioned on the operating table with the  neck in slight extension using the Mayfield head rest.  Five pounds of  halter traction was applied.  The neck was prepped and draped in the usual  sterile fashion.  A 4 cm incision was made on  the left side level the  thyroid cartilage transverse fashion in a natural skin crease.  Dissection  was carried sharply through the platysma.  The interval between  sternocleidomastoid and strap muscles medially was developed down to the  prevertebral space.  The C6-7 disk space identified by the large anterior  osteophyte.  The esophagus, trachea and carotid sheath were identified and  protected at all times.  Spinal needle placed in the C6-7 disk space.  X-ray  taken to confirm location.  Longus colli muscle elevated out over the C6-7  disk space bilaterally.  Leksell rongeur used to remove the overhanging  anterior osteophytes.  Deep Shadowline retractor placed.  Caspar distraction  pins placed in C6-C7 vertebral bodies.  Gentle distraction applied.  Microscope draped and brought into the field.  Diskectomy carried back to  the posterior longitudinal ligament. The disk material was found to be  degenerative.  There were large uncovertebral osteophytes taken down with  the high speed bur.  The posterior vertebral margins were thinned with the  high speed bur and undercut with the micro Kerrison.  Wide foraminotomies  were completed.  The posterior longitudinal ligament was taken down and a  chronic appearing disk herniation was removed from the subligamentous  position on the right side into the foramen.  Several additional small  fragments of disk material were then fished out with a nerve hook.  The  wound was irrigated.  Bleeding was controlled with Flow-Seal.  The  cartilaginous end plates were taken down using high speed bur.  A 7 mm  allograft prosthesis spacer was packed with the local autogenous bone graft  collected from the bur shavings.  This was carefully countersunk 1 mm.  We  then placed a 22 mm anterior cervical plate with four 12 mm locking screws.  We had good screw purchase.  We ensured the locking mechanisms engaged.  The  wound was irrigated. The esophagus, trachea  and carotid sheath inspected.  There were no apparent injuries.  Deep Penrose drain left in place.  Platysma closed with 2-0 interrupted Vicryl.  Subcutaneous layer closed with  3-0 interrupted Vicryl followed by a running 4-0 subcuticular Vicryl suture  on the skin.  Benzoin and Steri-Strips applied.  Sterile dressing placed.  Soft collar placed.  The patient was extubated without difficulty and  transferred to the recovery room in stable condition able to move his upper  and lower extremities.                                               Sharolyn Douglas, M.D.    MC/MEDQ  D:  10/27/2003  T:  10/29/2003  Job:  [id number]

## 2010-09-22 NOTE — Op Note (Signed)
NAME:  Jamie [name], Jamie [name]                      ACCOUNT NO.:  [id number]   MEDICAL RECORD NO.:  [id number]                   PATIENT TYPE:  AMB   LOCATION:  SDC                                  FACILITY:  [facility]   PHYSICIAN:  Miguel Aschoff, M.D.                    DATE OF BIRTH:  [birth year]   DATE OF PROCEDURE:  02/23/2003  DATE OF DISCHARGE:                                 OPERATIVE REPORT   PREOPERATIVE DIAGNOSIS:  Menometrorrhagia.   POSTOPERATIVE DIAGNOSES:  1. Menometrorrhagia.  2. Submucous myoma.   PROCEDURE:  Cervical dilatation, hysteroscopy, uterine curettage,  endometrial ablation using cryotherapy.   SURGEON:  Miguel Aschoff, M.D.   ANESTHESIA:  General anesthesia.   COMPLICATIONS:  None.   INDICATIONS FOR PROCEDURE:  The patient is a 55 year old white female with  history of progressively worsening menorrhagia and metrorrhagia.  Attempts  to control the patient's bleeding on an outpatient basis have bene  unsuccessful and she presents now to undergo hysteroscopy, D&C and  endometrial cryoablation.  The risks and benefits of the procedure were  discussed with the patient and informed consent had been obtained.   DESCRIPTION OF PROCEDURE:  The patient was taken to the operating room,  placed in the supine position and general anesthesia was administered  without difficulty. She was then placed in the dorsal lithotomy position and  prepped and draped in the usual sterile fashion.  Bladder was catheterized.  Examination under anesthesia was carried out.  This revealed normal external  genitalia, no Bartholin's and Skene's glands, normal urethra.  Vaginal vault  was without lesion.  Cervix was without lesion.  Uterus was noted to be  anterior, globular in shape, approximately six weeks equivalent size.  The  adnexa revealed no masses.  At this point, speculum was placed in the  vaginal vault, anterior cervical lip was grasped with the tenaculum and then  the endocervical  canal was dilated using serial Pratt dilators until a #25  Pratt dilator could be passed.  At this point, the diagnostic hysteroscope  was advanced under direct visualization.  The cervical canal did not reveal  any lesions.  On entering the endometrial cavity, there appeared to be  submucous myoma on the left lateral uterine wall.  The remainder of the  cavity was unremarkable.  The hysteroscope was then removed and a serrated  curette was then used and cavity vigorously curetted with moderate amount of  tissue being obtained.  Following curettage, endometrial cryoablation was  carried out for three cycles of six minutes each by placing the cryo tip in  the left cornual region followed by the right cornual region and followed by  the mid uterus with thaw cycles between each freeze.  This was done without  difficulty.  Following the completion of the third freeze cycle, the  procedure was completed.  The cervix was injected with 8 mL of 1% Xylocaine.  Speculum was removed.  The patient was reversed from the anesthetic and  taken to the recovery room in satisfactory condition.  The estimated blood  loss was approximately 50 mL.   PLAN:  The patient will be discharged home.   MEDICATIONS:  Medications for home include:  1. Darvocet-N 100 one every four hours as needed for pain.  2. Doxycycline 100 mg twice a day x3 days.   FOLLOW UP:  The patient will be seen back in four weeks for follow-up  examination.   DISCHARGE INSTRUCTIONS:  She is instructed not to place anything in the  vagina for four weeks.                                               Miguel Aschoff, M.D.    AR/MEDQ  D:  02/23/2003  T:  02/23/2003  Job:  [id number]

## 2010-10-17 ENCOUNTER — Encounter: Payer: 59 | Attending: Internal Medicine

## 2010-10-17 DIAGNOSIS — Z713 Dietary counseling and surveillance: Secondary | ICD-10-CM | POA: Insufficient documentation

## 2010-10-17 DIAGNOSIS — E119 Type 2 diabetes mellitus without complications: Secondary | ICD-10-CM | POA: Insufficient documentation

## 2010-11-01 ENCOUNTER — Other Ambulatory Visit (INDEPENDENT_AMBULATORY_CARE_PROVIDER_SITE_OTHER): Payer: 59

## 2010-11-01 ENCOUNTER — Other Ambulatory Visit: Payer: 59

## 2010-11-01 DIAGNOSIS — I10 Essential (primary) hypertension: Secondary | ICD-10-CM

## 2010-11-01 DIAGNOSIS — R7309 Other abnormal glucose: Secondary | ICD-10-CM

## 2010-11-01 LAB — BASIC METABOLIC PANEL
BUN: 15 mg/dL (ref 6–23)
CO2: 30 mEq/L (ref 19–32)
Calcium: 9.5 mg/dL (ref 8.4–10.5)
Chloride: 106 mEq/L (ref 96–112)
Creatinine, Ser: 0.8 mg/dL (ref 0.4–1.2)
GFR: 76.74 mL/min (ref >60.00)
Glucose, Bld: 111 mg/dL — ABNORMAL HIGH (ref 70–99)
Potassium: 4.7 mEq/L (ref 3.5–5.1)
Sodium: 142 mEq/L (ref 135–145)

## 2010-11-01 LAB — HEMOGLOBIN A1C: Hgb A1c MFr Bld: 6.5 % (ref 4.6–6.5)

## 2010-11-01 LAB — MICROALBUMIN / CREATININE URINE RATIO
Creatinine,U: 114.9 mg/dL
Microalb Creat Ratio: 1.2 mg/g (ref 0.0–30.0)
Microalb, Ur: 1.4 mg/dL (ref 0.0–1.9)

## 2010-11-02 ENCOUNTER — Encounter: Payer: Self-pay | Admitting: Internal Medicine

## 2010-11-03 ENCOUNTER — Encounter: Payer: Self-pay | Admitting: Family Medicine

## 2010-11-03 ENCOUNTER — Ambulatory Visit (INDEPENDENT_AMBULATORY_CARE_PROVIDER_SITE_OTHER): Payer: 59 | Admitting: Family Medicine

## 2010-11-03 ENCOUNTER — Ambulatory Visit: Payer: 59 | Admitting: Internal Medicine

## 2010-11-03 VITALS — BP 120/70 | HR 73 | Temp 98.1°F | Resp 18 | Wt 140.0 lb

## 2010-11-03 DIAGNOSIS — E785 Hyperlipidemia, unspecified: Secondary | ICD-10-CM

## 2010-11-03 DIAGNOSIS — I10 Essential (primary) hypertension: Secondary | ICD-10-CM

## 2010-11-03 DIAGNOSIS — E119 Type 2 diabetes mellitus without complications: Secondary | ICD-10-CM

## 2010-11-03 LAB — LIPID PANEL
Cholesterol: 203 mg/dL — ABNORMAL HIGH (ref 0–200)
HDL: 39.3 mg/dL (ref >39.00)
Total CHOL/HDL Ratio: 5
Triglycerides: 299 mg/dL — ABNORMAL HIGH (ref 0.0–149.0)
VLDL: 59.8 mg/dL — ABNORMAL HIGH (ref 0.0–40.0)

## 2010-11-03 LAB — LDL CHOLESTEROL, DIRECT: Direct LDL: 131.5 mg/dL

## 2010-11-03 NOTE — Assessment & Plan Note (Signed)
Not currently on meds. BP normal today. Cont. Diet/exercise.

## 2010-11-03 NOTE — Progress Notes (Signed)
OFFICE NOTE  11/03/2010  CC:  Chief Complaint  Patient presents with  . Hyperlipidemia  . Hypertension     HPI:   Patient is a 55 y.o. Caucasian female who is here for f/u DM 2 and hyperlipidemia. She restarted metformin 500mg  bid 07/2010.   She saw DM 2 nutritionist/education class and says it was very helpful. Currently not checking glucose at home, has no glucometer. Reports excessive thirst lately--worse x 2 wks.  Does not correlate it with any certain med.  Urinates normal, appetite normal. We reviewed BMET and HbA1c from 2 days ago and these were very good (normal BMET, HbA1c 6.5%-no change from 07/2010).  Lipids were not repeated this time b/c no order.  Pertinent PMH:  DM 2, non insulin-requiring. Hyperlipidemia GERD Anxiety  MEDS;   Outpatient Prescriptions Prior to Visit  Medication Sig Dispense Refill  . ALPRAZolam (XANAX) 0.5 MG tablet Take 1 tablet (0.5 mg total) by mouth at bedtime as needed.  30 tablet  3  . Calcium Carbonate-Vitamin D (CALTRATE 600+D) 600-400 MG-UNIT per tablet Take 1 tablet by mouth daily.        . metFORMIN (GLUCOPHAGE) 500 MG tablet Take 1 tablet (500 mg total) by mouth 2 (two) times daily with a meal.  60 tablet  3  . omeprazole (PRILOSEC) 20 MG capsule TAKE ONE (1) CAPSULE(S) TWICE DAILY  60 capsule  3  . simvastatin (ZOCOR) 20 MG tablet Take 1 tablet (20 mg total) by mouth at bedtime.  90 tablet  1  . etodolac (LODINE) 400 MG tablet Take 400 mg by mouth 2 (two) times daily.        . Multiple Vitamins-Minerals (MULTIVITAMIN WITH MINERALS) tablet Take 1 tablet by mouth daily.        Marland Kitchen triamcinolone (NASACORT) 55 MCG/ACT nasal inhaler 2 sprays by Nasal route daily. Each nostril.         PE: Blood pressure 120/70, pulse 73, temperature 98.1 F (36.7 C), temperature source Oral, resp. rate 18, weight 140 lb (63.504 kg). Gen: Alert, well appearing.  Patient is oriented to person, place, time, and situation. Mouth/Throat: normal mucosa, moist  and pink. Neck: supple, ROM full.  Carotids 2+ bilat, without bruit.  No lymphadenopathy, thyromegaly, or mass. Chest: symmetric expansion, nonlabored respirations.  Clear and equal breath sounds in all lung fields.   CV: RRR, no m/r/g.  Peripheral pulses 2+ and symmetric. EXT: no clubbing, cyanosis, or edema.    IMPRESSION AND PLAN:  Diabetes mellitus type II Doing well with diet and metformin.  Continue these. Lab Results  Component Value Date   HGBA1C 6.5 11/01/2010  Recheck A1c in 50mo.  Glucometer with strips given today (Accucheck Aviva plus, #50 strips) and nurse taught her how to check glucose. I recommeded qod fasting gluc check. She reports having already had a D.R eye screening exam in the last year. Urine microalbumin neg 11/01/10.   HYPERLIPIDEMIA Continue zocor 20mg  qd for now, but she does need lipid panel added to labs drawn 11/01/10.  Last 3 lipid panels:  Lab Results  Component Value Date   CHOL 239* 07/25/2010   CHOL 219* 02/03/2010   CHOL 205* 07/18/2009   Lab Results  Component Value Date   HDL 41.40 07/25/2010   HDL 16.10 02/03/2010   HDL 96.04 07/18/2009   Lab Results  Component Value Date   LDLCALC 91 10/28/2008   LDLCALC 111* 04/05/2008   LDLCALC See Comment mg/dL 5/40/9811   Lab Results  Component  Value Date   TRIG 451.0* 07/25/2010   TRIG 236.0* 02/03/2010   TRIG 715.0* 07/18/2009   Lab Results  Component Value Date   CHOLHDL 6 07/25/2010   CHOLHDL 5 02/03/2010   CHOLHDL 5 07/18/2009   Lab Results  Component Value Date   LDLDIRECT 108.1 07/25/2010   LDLDIRECT 143.2 02/03/2010   LDLDIRECT 86.8 07/18/2009      HYPERTENSION Not currently on meds. BP normal today. Cont. Diet/exercise.     FOLLOW UP:  Return in about 4 months (around 03/05/2011) for f/u DM 2 and hyperlipidemia.

## 2010-11-03 NOTE — Assessment & Plan Note (Signed)
Continue zocor 20mg  qd for now, but she does need lipid panel added to labs drawn 11/01/10.  Last 3 lipid panels:  Lab Results  Component Value Date   CHOL 239* 07/25/2010   CHOL 219* 02/03/2010   CHOL 205* 07/18/2009   Lab Results  Component Value Date   HDL 41.40 07/25/2010   HDL 11.91 02/03/2010   HDL 47.82 07/18/2009   Lab Results  Component Value Date   LDLCALC 91 10/28/2008   LDLCALC 111* 04/05/2008   LDLCALC See Comment mg/dL 9/56/2130   Lab Results  Component Value Date   TRIG 451.0* 07/25/2010   TRIG 236.0* 02/03/2010   TRIG 715.0* 07/18/2009   Lab Results  Component Value Date   CHOLHDL 6 07/25/2010   CHOLHDL 5 02/03/2010   CHOLHDL 5 07/18/2009   Lab Results  Component Value Date   LDLDIRECT 108.1 07/25/2010   LDLDIRECT 143.2 02/03/2010   LDLDIRECT 86.8 07/18/2009

## 2010-11-03 NOTE — Assessment & Plan Note (Signed)
Doing well with diet and metformin.  Continue these. Lab Results  Component Value Date   HGBA1C 6.5 11/01/2010  Recheck A1c in 21mo.  Glucometer with strips given today (Accucheck Aviva plus, #50 strips) and nurse taught her how to check glucose. I recommeded qod fasting gluc check. She reports having already had a D.R eye screening exam in the last year. Urine microalbumin neg 11/01/10.

## 2011-02-22 ENCOUNTER — Telehealth: Payer: Self-pay | Admitting: Internal Medicine

## 2011-02-22 MED ORDER — METFORMIN HCL 500 MG PO TABS: 500.0000 mg | ORAL_TABLET | Freq: Two times a day (BID) | ORAL | Status: DC

## 2011-02-22 NOTE — Telephone Encounter (Signed)
Rx refill sent to pharmacy.Patient is due for office visit 

## 2011-02-22 NOTE — Telephone Encounter (Signed)
Refill- metformin hcl oral tablet 500mg . Take one tablet by mouth twice daily. Qty 60. Last fill 9.3.12

## 2011-03-07 ENCOUNTER — Ambulatory Visit: Payer: 59 | Attending: Gynecology | Admitting: Gynecology

## 2011-03-07 DIAGNOSIS — Z8041 Family history of malignant neoplasm of ovary: Secondary | ICD-10-CM | POA: Insufficient documentation

## 2011-03-07 DIAGNOSIS — Z800 Family history of malignant neoplasm of digestive organs: Secondary | ICD-10-CM | POA: Insufficient documentation

## 2011-03-07 DIAGNOSIS — Z803 Family history of malignant neoplasm of breast: Secondary | ICD-10-CM | POA: Insufficient documentation

## 2011-03-07 DIAGNOSIS — Z8049 Family history of malignant neoplasm of other genital organs: Secondary | ICD-10-CM | POA: Insufficient documentation

## 2011-03-07 DIAGNOSIS — E785 Hyperlipidemia, unspecified: Secondary | ICD-10-CM | POA: Insufficient documentation

## 2011-03-07 DIAGNOSIS — C549 Malignant neoplasm of corpus uteri, unspecified: Secondary | ICD-10-CM | POA: Insufficient documentation

## 2011-03-08 DIAGNOSIS — C569 Malignant neoplasm of unspecified ovary: Secondary | ICD-10-CM

## 2011-03-08 DIAGNOSIS — C541 Malignant neoplasm of endometrium: Secondary | ICD-10-CM

## 2011-03-08 HISTORY — DX: Malignant neoplasm of unspecified ovary: C56.9

## 2011-03-08 HISTORY — DX: Malignant neoplasm of endometrium: C54.1

## 2011-03-09 NOTE — Consult Note (Signed)
NAME:  Jamie [name], Jamie [name]            ACCOUNT NO.:  [id number]  MEDICAL RECORD NO.:  [id number]  LOCATION:  GYN                          FACILITY:  [facility]  PHYSICIAN:  De Blanch, M.D.DATE OF BIRTH:  11-02-1955  DATE OF CONSULTATION: DATE OF DISCHARGE:                                CONSULTATION   CHIEF COMPLAINT:  Endometrial cancer (grade 1).  HISTORY OF PRESENT ILLNESS:  A 55 year old white female seen in consultation at the request of Dr. Miguel Aschoff regarding management of a newly diagnosed endometrial cancer.  Patient presented with some postmenopausal bleeding, whereupon an endometrial biopsy was obtained showing a grade 1 endometrial cancer.  The patient denies any current bleeding, although she does have some lower pelvic pain.  It is interesting to note in her history that she had an endometrial ablation in 2004 and had no bleeding until just recently.  She has no other significant gynecologic history.  PAST MEDICAL HISTORY:  Medical illnesses, 1. Hyperlipidemia. 2. "Borderline" diabetes.  PAST SURGICAL HISTORY:  Repair of Achilles tendon rupture on 7 occasions, cervical spine surgery as well as lumbar spine surgery in 2005 and 1999 respectively.  The patient had a colonoscopy on 2 occasions, removing benign polyps.  DRUG ALLERGIES:  TYLOX (itching).  FAMILY HISTORY:  The patient has 2 maternal cousins, one ovarian cancer, one with uterine cancer.  A maternal aunt had breast cancer.  A paternal grandmother had colon cancer.  SOCIAL HISTORY:  The patient is married and does not work outside the home.  OBSTETRICAL HISTORY:  Gravida 2.  REVIEW OF SYSTEMS:  Ten-point comprehensive review of systems negative except as noted above.  PHYSICAL EXAMINATION:  VITAL SIGNS:  Height 5 feet 2 inches, weight 135 pounds, blood pressure 120/80.  Remainder of vital signs are in the patient's record. GENERAL:  Patient is a healthy, white female in no acute  distress. HEENT:  Negative. NECK:  Supple without thyromegaly.  There is no supraclavicular or inguinal adenopathy. ABDOMEN:  Soft and nontender.  No mass, organomegaly, ascites, or hernias noted. PELVIC EXAM:  EGBUS, vagina, bladder, and urethra are normal.  Cervix and uterus are seemed to be normal as well.  There is some blood in the cervical os.  No adnexal masses are noted.  IMPRESSION:  Grade 1 endometrial cancer.  I just had a lengthy discussion with the patient and her husband, regarding management and we have agreed that she would be best initially managed by performing a robotic hysterectomy, bilateral salpingo-oophorectomy, and intraoperative decision making regarding the risks and benefits performing a pelvic and periaortic lymphadenectomy.  The patient wished to proceed with surgery as soon as possible, and we will therefore schedule her with Dr. Cleda Mccreedy, on November 13.  The risks of surgery including hemorrhage, infection, injury to adjacent viscera, thrombolic complications, and anesthetic risks were all outlined.  We will contact the patient later today to arrange for preoperative testing.  All of their questions were answered.     De Blanch, M.D.     DC/MEDQ  D:  03/07/2011  T:  03/08/2011  Job:  [id number]  cc:   Miguel Aschoff, M.D.  Telford Nab, R.N. [address] [place], [place]  [address]  Electronically Signed by De Blanch M.D. on 03/09/2011 09:28:59 AM

## 2011-03-12 ENCOUNTER — Encounter (HOSPITAL_COMMUNITY): Payer: Self-pay | Admitting: Pharmacy Technician

## 2011-03-14 ENCOUNTER — Encounter (HOSPITAL_COMMUNITY): Payer: 59

## 2011-03-14 ENCOUNTER — Other Ambulatory Visit: Payer: Self-pay

## 2011-03-14 ENCOUNTER — Encounter (HOSPITAL_COMMUNITY): Payer: Self-pay

## 2011-03-14 ENCOUNTER — Ambulatory Visit (HOSPITAL_COMMUNITY)
Admission: RE | Admit: 2011-03-14 | Discharge: 2011-03-14 | Disposition: A | Discharge status: Home / Self Care | Payer: 59 | Source: Ambulatory Visit | Attending: Gynecologic Oncology | Admitting: Gynecologic Oncology

## 2011-03-14 DIAGNOSIS — R05 Cough: Secondary | ICD-10-CM | POA: Insufficient documentation

## 2011-03-14 DIAGNOSIS — Z01812 Encounter for preprocedural laboratory examination: Secondary | ICD-10-CM | POA: Insufficient documentation

## 2011-03-14 DIAGNOSIS — R7309 Other abnormal glucose: Secondary | ICD-10-CM | POA: Insufficient documentation

## 2011-03-14 DIAGNOSIS — R059 Cough, unspecified: Secondary | ICD-10-CM | POA: Insufficient documentation

## 2011-03-14 DIAGNOSIS — Z01818 Encounter for other preprocedural examination: Secondary | ICD-10-CM | POA: Insufficient documentation

## 2011-03-14 LAB — CBC
HCT: 42.1 % (ref 36.0–46.0)
Hemoglobin: 13.8 g/dL (ref 12.0–15.0)
MCH: 29.1 pg (ref 26.0–34.0)
MCHC: 32.8 g/dL (ref 30.0–36.0)
MCV: 88.6 fL (ref 78.0–100.0)
Platelets: 316 10*3/uL (ref 150–400)
RBC: 4.75 MIL/uL (ref 3.87–5.11)
RDW: 12.7 % (ref 11.5–15.5)
WBC: 8.1 10*3/uL (ref 4.0–10.5)

## 2011-03-14 LAB — DIFFERENTIAL
Basophils Absolute: 0.1 10*3/uL (ref 0.0–0.1)
Basophils Relative: 1 % (ref 0–1)
Eosinophils Absolute: 0.6 10*3/uL (ref 0.0–0.7)
Eosinophils Relative: 8 % — ABNORMAL HIGH (ref 0–5)
Lymphocytes Relative: 33 % (ref 12–46)
Lymphs Abs: 2.6 10*3/uL (ref 0.7–4.0)
Monocytes Absolute: 0.5 10*3/uL (ref 0.1–1.0)
Monocytes Relative: 6 % (ref 3–12)
Neutro Abs: 4.3 10*3/uL (ref 1.7–7.7)
Neutrophils Relative %: 53 % (ref 43–77)

## 2011-03-14 LAB — COMPREHENSIVE METABOLIC PANEL
ALT: 21 U/L (ref 0–35)
AST: 17 U/L (ref 0–37)
Albumin: 4.3 g/dL (ref 3.5–5.2)
Alkaline Phosphatase: 87 U/L (ref 39–117)
BUN: 10 mg/dL (ref 6–23)
CO2: 28 mEq/L (ref 19–32)
Calcium: 10.5 mg/dL (ref 8.4–10.5)
Chloride: 104 mEq/L (ref 96–112)
Creatinine, Ser: 0.82 mg/dL (ref 0.50–1.10)
GFR calc Af Amer: >90 mL/min (ref >90)
GFR calc non Af Amer: 79 mL/min — ABNORMAL LOW (ref >90)
Glucose, Bld: 104 mg/dL — ABNORMAL HIGH (ref 70–99)
Potassium: 5.5 mEq/L — ABNORMAL HIGH (ref 3.5–5.1)
Sodium: 141 mEq/L (ref 135–145)
Total Bilirubin: 0.3 mg/dL (ref 0.3–1.2)
Total Protein: 7 g/dL (ref 6.0–8.3)

## 2011-03-14 LAB — SURGICAL PCR SCREEN
MRSA, PCR: INVALID — AB
Staphylococcus aureus: INVALID — AB

## 2011-03-17 LAB — MRSA CULTURE

## 2011-03-20 ENCOUNTER — Encounter (HOSPITAL_COMMUNITY)
Admission: AD | Disposition: A | Discharge status: Home / Self Care | Payer: Self-pay | Source: Ambulatory Visit | Attending: Obstetrics & Gynecology

## 2011-03-20 ENCOUNTER — Ambulatory Visit (HOSPITAL_COMMUNITY): Payer: 59 | Admitting: Anesthesiology

## 2011-03-20 ENCOUNTER — Other Ambulatory Visit: Payer: Self-pay | Admitting: Gynecologic Oncology

## 2011-03-20 ENCOUNTER — Ambulatory Visit (HOSPITAL_COMMUNITY)
Admission: AD | Admit: 2011-03-20 | Discharge: 2011-03-21 | Disposition: A | Discharge status: Home / Self Care | Payer: 59 | Source: Ambulatory Visit | Attending: Obstetrics & Gynecology | Admitting: Obstetrics & Gynecology

## 2011-03-20 ENCOUNTER — Encounter (HOSPITAL_COMMUNITY): Payer: Self-pay | Admitting: *Deleted

## 2011-03-20 ENCOUNTER — Encounter (HOSPITAL_COMMUNITY): Payer: Self-pay | Admitting: Anesthesiology

## 2011-03-20 DIAGNOSIS — C549 Malignant neoplasm of corpus uteri, unspecified: Secondary | ICD-10-CM | POA: Diagnosis present

## 2011-03-20 DIAGNOSIS — C55 Malignant neoplasm of uterus, part unspecified: Secondary | ICD-10-CM | POA: Insufficient documentation

## 2011-03-20 LAB — CBC
HCT: 40.3 % (ref 36.0–46.0)
Hemoglobin: 13.6 g/dL (ref 12.0–15.0)
MCH: 29.8 pg (ref 26.0–34.0)
MCHC: 33.7 g/dL (ref 30.0–36.0)
MCV: 88.4 fL (ref 78.0–100.0)
Platelets: 255 10*3/uL (ref 150–400)
RBC: 4.56 MIL/uL (ref 3.87–5.11)
RDW: 12.8 % (ref 11.5–15.5)
WBC: 17.4 10*3/uL — ABNORMAL HIGH (ref 4.0–10.5)

## 2011-03-20 LAB — SAMPLE TO BLOOD BANK

## 2011-03-20 LAB — GLUCOSE, CAPILLARY
Glucose-Capillary: 107 mg/dL — ABNORMAL HIGH (ref 70–99)
Glucose-Capillary: 105 mg/dL — ABNORMAL HIGH (ref 70–99)

## 2011-03-20 SURGERY — ROBOTIC ASSISTED TOTAL HYSTERECTOMY WITH BILATERAL SALPINGO OOPHORECTOMY
Anesthesia: General | Site: Abdomen | Laterality: Bilateral | Wound class: Clean Contaminated

## 2011-03-20 MED ORDER — PANTOPRAZOLE SODIUM 40 MG PO TBEC
40.0000 mg | DELAYED_RELEASE_TABLET | Freq: Every day | ORAL | Status: DC
  Administered 2011-03-21: 40 mg via ORAL
  Filled 2011-03-20: qty 1

## 2011-03-20 MED ORDER — METFORMIN HCL 500 MG PO TABS
500.0000 mg | ORAL_TABLET | Freq: Two times a day (BID) | ORAL | Status: DC
Start: 2011-03-21 — End: 2011-03-21
  Administered 2011-03-21: 500 mg via ORAL
  Filled 2011-03-20 (×2): qty 1

## 2011-03-20 MED ORDER — NEOSTIGMINE METHYLSULFATE 1 MG/ML IJ SOLN
INTRAMUSCULAR | Status: DC | PRN
  Administered 2011-03-20: 5 mg via INTRAVENOUS

## 2011-03-20 MED ORDER — HYDROMORPHONE HCL PF 1 MG/ML IJ SOLN
0.2500 mg | INTRAMUSCULAR | Status: DC | PRN
  Administered 2011-03-20 (×2): 0.5 mg via INTRAVENOUS

## 2011-03-20 MED ORDER — LIDOCAINE HCL (CARDIAC) 20 MG/ML IV SOLN
INTRAVENOUS | Status: DC | PRN
  Administered 2011-03-20: 80 mg via INTRAVENOUS

## 2011-03-20 MED ORDER — PROMETHAZINE HCL 25 MG/ML IJ SOLN
6.2500 mg | INTRAMUSCULAR | Status: DC | PRN
  Administered 2011-03-20: 6.25 mg via INTRAVENOUS

## 2011-03-20 MED ORDER — ONDANSETRON HCL 4 MG PO TABS: 4.0000 mg | ORAL_TABLET | Freq: Four times a day (QID) | ORAL | Status: DC | PRN

## 2011-03-20 MED ORDER — LACTATED RINGERS IV SOLN
INTRAVENOUS | Status: DC
  Administered 2011-03-20 (×3): via INTRAVENOUS

## 2011-03-20 MED ORDER — ONDANSETRON HCL 4 MG/2ML IJ SOLN
INTRAMUSCULAR | Status: AC
  Administered 2011-03-20: 4 mg via INTRAVENOUS
  Filled 2011-03-20: qty 2

## 2011-03-20 MED ORDER — MAGNESIUM HYDROXIDE 400 MG/5ML PO SUSP
30.0000 mL | Freq: Three times a day (TID) | ORAL | Status: DC
  Administered 2011-03-20 – 2011-03-21 (×2): 30 mL via ORAL
  Filled 2011-03-20 (×3): qty 30

## 2011-03-20 MED ORDER — HYDROMORPHONE HCL 2 MG PO TABS
2.0000 mg | ORAL_TABLET | ORAL | Status: DC | PRN
  Administered 2011-03-21 (×2): 2 mg via ORAL
  Filled 2011-03-20 (×2): qty 1

## 2011-03-20 MED ORDER — FENTANYL CITRATE 0.05 MG/ML IJ SOLN
INTRAMUSCULAR | Status: DC | PRN
  Administered 2011-03-20: 100 ug via INTRAVENOUS
  Administered 2011-03-20 (×5): 50 ug via INTRAVENOUS

## 2011-03-20 MED ORDER — SUCCINYLCHOLINE CHLORIDE 20 MG/ML IJ SOLN
INTRAMUSCULAR | Status: DC | PRN
  Administered 2011-03-20: 100 mg via INTRAVENOUS

## 2011-03-20 MED ORDER — ALPRAZOLAM 0.5 MG PO TABS: 0.5000 mg | ORAL_TABLET | Freq: Every evening | ORAL | Status: DC | PRN

## 2011-03-20 MED ORDER — ONDANSETRON HCL 4 MG/2ML IJ SOLN
4.0000 mg | INTRAMUSCULAR | Status: DC | PRN
  Administered 2011-03-20: 4 mg via INTRAVENOUS

## 2011-03-20 MED ORDER — ONDANSETRON HCL 4 MG/2ML IJ SOLN
4.0000 mg | Freq: Four times a day (QID) | INTRAMUSCULAR | Status: DC | PRN
  Administered 2011-03-20: 4 mg via INTRAVENOUS

## 2011-03-20 MED ORDER — MORPHINE SULFATE 2 MG/ML IJ SOLN
2.0000 mg | INTRAMUSCULAR | Status: DC | PRN
  Filled 2011-03-20: qty 1

## 2011-03-20 MED ORDER — CELECOXIB 200 MG PO CAPS
200.0000 mg | ORAL_CAPSULE | Freq: Every day | ORAL | Status: DC | PRN
  Filled 2011-03-20: qty 1

## 2011-03-20 MED ORDER — PROPOFOL 10 MG/ML IV EMUL
INTRAVENOUS | Status: DC | PRN
  Administered 2011-03-20: 150 mg via INTRAVENOUS

## 2011-03-20 MED ORDER — GLYCOPYRROLATE 0.2 MG/ML IJ SOLN
INTRAMUSCULAR | Status: DC | PRN
  Administered 2011-03-20: .8 mg via INTRAVENOUS

## 2011-03-20 MED ORDER — ONDANSETRON HCL 4 MG/2ML IJ SOLN
INTRAMUSCULAR | Status: DC | PRN
  Administered 2011-03-20: 4 mg via INTRAVENOUS

## 2011-03-20 MED ORDER — CEFOXITIN SODIUM-DEXTROSE 1-4 GM-% IV SOLR (PREMIX)
1.0000 g | Freq: Once | INTRAVENOUS | Status: AC
  Administered 2011-03-20: 1 g via INTRAVENOUS

## 2011-03-20 MED ORDER — SCOPOLAMINE 1 MG/3DAYS TD PT72
MEDICATED_PATCH | TRANSDERMAL | Status: DC | PRN
  Administered 2011-03-20: 1 via TRANSDERMAL

## 2011-03-20 MED ORDER — ROCURONIUM BROMIDE 100 MG/10ML IV SOLN
INTRAVENOUS | Status: DC | PRN
  Administered 2011-03-20: 50 mg via INTRAVENOUS

## 2011-03-20 MED ORDER — ONDANSETRON HCL 4 MG PO TABS: 4.0000 mg | ORAL_TABLET | ORAL | Status: DC | PRN

## 2011-03-20 MED ORDER — KCL IN DEXTROSE-NACL 20-5-0.45 MEQ/L-%-% IV SOLN
INTRAVENOUS | Status: DC
  Administered 2011-03-20 – 2011-03-21 (×2): via INTRAVENOUS
  Filled 2011-03-20 (×4): qty 1000

## 2011-03-20 MED ORDER — HYDRALAZINE HCL 20 MG/ML IJ SOLN
INTRAMUSCULAR | Status: DC | PRN
  Administered 2011-03-20: 5 mg via INTRAVENOUS

## 2011-03-20 MED ORDER — ACETAMINOPHEN 10 MG/ML IV SOLN
INTRAVENOUS | Status: DC | PRN
  Administered 2011-03-20: 1000 mg via INTRAVENOUS

## 2011-03-20 MED ORDER — MIDAZOLAM HCL 5 MG/5ML IJ SOLN
INTRAMUSCULAR | Status: DC | PRN
  Administered 2011-03-20: 2 mg via INTRAVENOUS

## 2011-03-20 MED ORDER — ZOLPIDEM TARTRATE 5 MG PO TABS: 5.0000 mg | ORAL_TABLET | Freq: Every evening | ORAL | Status: DC | PRN

## 2011-03-20 MED ORDER — RINGERS IRRIGATION IR SOLN
Status: DC | PRN
  Administered 2011-03-20: 50 mL

## 2011-03-20 SURGICAL SUPPLY — 56 items
APL SKNCLS STERI-STRIP NONHPOA (GAUZE/BANDAGES/DRESSINGS) ×1
BAG SPEC RTRVL LRG 6X4 10 (ENDOMECHANICALS) ×2
BENZOIN TINCTURE PRP APPL 2/3 (GAUZE/BANDAGES/DRESSINGS) ×2 IMPLANT
CHLORAPREP W/TINT 26ML (MISCELLANEOUS) ×2 IMPLANT
CLOTH BEACON ORANGE TIMEOUT ST (SAFETY) ×2 IMPLANT
CORD HIGH FREQUENCY UNIPOLAR (ELECTROSURGICAL) ×1 IMPLANT
CORDS BIPOLAR (ELECTRODE) ×2 IMPLANT
COVER MAYO STAND STRL (DRAPES) ×2 IMPLANT
COVER SURGICAL LIGHT HANDLE (MISCELLANEOUS) ×2 IMPLANT
COVER TIP SHEARS 8 DVNC (MISCELLANEOUS) ×1 IMPLANT
COVER TIP SHEARS 8MM DA VINCI (MISCELLANEOUS) ×1
DECANTER SPIKE VIAL GLASS SM (MISCELLANEOUS) ×1 IMPLANT
DRAPE LG THREE QUARTER DISP (DRAPES) ×4 IMPLANT
DRAPE SURG IRRIG POUCH 19X23 (DRAPES) ×2 IMPLANT
DRAPE TABLE BACK 44X90 PK DISP (DRAPES) ×4 IMPLANT
DRAPE UTILITY XL STRL (DRAPES) ×2 IMPLANT
DRAPE WARM FLUID 44X44 (DRAPE) ×2 IMPLANT
DRSG TEGADERM 6X8 (GAUZE/BANDAGES/DRESSINGS) ×4 IMPLANT
ELECT REM PT RETURN 9FT ADLT (ELECTROSURGICAL) ×2
ELECTRODE REM PT RTRN 9FT ADLT (ELECTROSURGICAL) ×1 IMPLANT
GAUZE VASELINE 3X9 (GAUZE/BANDAGES/DRESSINGS) IMPLANT
GLOVE BIO SURGEON STRL SZ 6.5 (GLOVE) ×8 IMPLANT
GLOVE BIO SURGEON STRL SZ7.5 (GLOVE) ×8 IMPLANT
GLOVE BIOGEL PI IND STRL 7.0 (GLOVE) ×2 IMPLANT
GLOVE BIOGEL PI INDICATOR 7.0 (GLOVE) ×2
GOWN PREVENTION PLUS XLARGE (GOWN DISPOSABLE) ×10 IMPLANT
HOLDER FOLEY CATH W/STRAP (MISCELLANEOUS) ×2 IMPLANT
KIT ACCESSORY DA VINCI DISP (KITS) ×1
KIT ACCESSORY DVNC DISP (KITS) ×1 IMPLANT
LUBRICANT JELLY K Y 4OZ (MISCELLANEOUS) ×2 IMPLANT
MANIPULATOR UTERINE 4.5 ZUMI (MISCELLANEOUS) ×2 IMPLANT
OCCLUDER COLPOPNEUMO (BALLOONS) ×2 IMPLANT
PACK LAPAROSCOPY W LONG (CUSTOM PROCEDURE TRAY) ×2 IMPLANT
PENCIL BUTTON HOLSTER BLD 10FT (ELECTRODE) ×1 IMPLANT
POSITIONER SURGICAL ARM (MISCELLANEOUS) ×2 IMPLANT
POUCH SPECIMEN RETRIEVAL 10MM (ENDOMECHANICALS) ×4 IMPLANT
SET TUBE IRRIG SUCTION NO TIP (IRRIGATION / IRRIGATOR) ×2 IMPLANT
SHEET LAVH (DRAPES) ×2 IMPLANT
SOLUTION ELECTROLUBE (MISCELLANEOUS) ×2 IMPLANT
SPONGE LAP 18X18 X RAY DECT (DISPOSABLE) IMPLANT
STRIP CLOSURE SKIN 1/2X4 (GAUZE/BANDAGES/DRESSINGS) ×2 IMPLANT
SUT VIC AB 0 CT1 27 (SUTURE) ×2
SUT VIC AB 0 CT1 27XBRD ANTBC (SUTURE) ×3 IMPLANT
SUT VIC AB 4-0 PS2 27 (SUTURE) ×4 IMPLANT
SUT VICRYL 0 UR6 27IN ABS (SUTURE) ×3 IMPLANT
SYR 50ML LL SCALE MARK (SYRINGE) ×2 IMPLANT
SYR BULB IRRIGATION 50ML (SYRINGE) IMPLANT
SYRINGE 10CC LL (SYRINGE) ×4 IMPLANT
TOWEL OR 17X26 10 PK STRL BLUE (TOWEL DISPOSABLE) ×5 IMPLANT
TRAP SPECIMEN MUCOUS 40CC (MISCELLANEOUS) ×1 IMPLANT
TRAY FOLEY CATH 14FRSI W/METER (CATHETERS) ×2 IMPLANT
TROCAR 12M 150ML BLUNT (TROCAR) ×2 IMPLANT
TROCAR BLADELESS OPT 5 100 (ENDOMECHANICALS) ×2 IMPLANT
TROCAR XCEL 12X100 BLDLESS (ENDOMECHANICALS) ×2 IMPLANT
TUBING FILTER THERMOFLATOR (ELECTROSURGICAL) ×1 IMPLANT
WATER STERILE IRR 1500ML POUR (IV SOLUTION) ×4 IMPLANT

## 2011-03-20 NOTE — Anesthesia Postprocedure Evaluation (Signed)
  Anesthesia Post-op Note  Patient: [name]  Procedure(s) Performed:  ROBOTIC ASSISTED TOTAL HYSTERECTOMY WITH SALPINGO OOPHERECTOMY - Robotic Assisted Total Hysterectomy with Bilateral Salpingo Oophorectomy with  Lymphadenectomy  Patient Location: PACU  Anesthesia Type: General  Level of Consciousness: oriented and sedated  Airway and Oxygen Therapy: Patient Spontanous Breathing and Patient connected to nasal cannula oxygen  Post-op Pain: mild  Post-op Assessment: Post-op Vital signs reviewed, Patient's Cardiovascular Status Stable, Respiratory Function Stable and Patent Airway  Post-op Vital Signs: stable  Complications: No apparent anesthesia complications

## 2011-03-20 NOTE — Preoperative (Signed)
Beta Blockers   Reason not to administer Beta Blockers:Not Applicable 

## 2011-03-20 NOTE — Anesthesia Preprocedure Evaluation (Addendum)
Anesthesia Evaluation  Patient identified by MRN, date of birth, ID band Patient awake    Reviewed: Allergy & Precautions, H&P , NPO status , Patient's Chart, lab work & pertinent test results, reviewed documented beta blocker date and time   History of Anesthesia Complications (+) PONV  Airway Mallampati: II TM Distance: >3 FB Neck ROM: Full    Dental No notable dental hx.    Pulmonary neg pulmonary ROS,  clear to auscultation        Cardiovascular neg cardio ROS Regular Normal Denies hx of HTN   Neuro/Psych Mask phobiaNegative Neurological ROS     GI/Hepatic negative GI ROS, Neg liver ROS,   Endo/Other  Diabetes mellitus-, Well Controlled, Type 2, Oral Hypoglycemic Agents  Renal/GU negative Renal ROS   Endometrial cancer    Musculoskeletal negative musculoskeletal ROS (+)   Abdominal   Peds negative pediatric ROS (+)  Hematology negative hematology ROS (+)   Anesthesia Other Findings Crowns in back  Reproductive/Obstetrics negative OB ROS                           Anesthesia Physical Anesthesia Plan  ASA: III  Anesthesia Plan: General   Post-op Pain Management:    Induction: Intravenous  Airway Management Planned: Oral ETT  Additional Equipment:   Intra-op Plan:   Post-operative Plan: Extubation in OR  Informed Consent: I have reviewed the patients History and Physical, chart, labs and discussed the procedure including the risks, benefits and alternatives for the proposed anesthesia with the patient or authorized representative who has indicated his/her understanding and acceptance.     Plan Discussed with: CRNA and Surgeon  Anesthesia Plan Comments:        Anesthesia Quick Evaluation

## 2011-03-20 NOTE — Transfer of Care (Signed)
Immediate Anesthesia Transfer of Care Note  Patient: Jamie [name]  Procedure(s) Performed:  ROBOTIC ASSISTED TOTAL HYSTERECTOMY WITH SALPINGO OOPHERECTOMY - Robotic Assisted Total Hysterectomy with Bilateral Salpingo Oophorectomy with  Lymphadenectomy  Patient Location: PACU  Anesthesia Type: General  Level of Consciousness: sedated, patient cooperative and responds to stimulaton  Airway & Oxygen Therapy: Patient Spontanous Breathing and Patient connected to face mask oxgen  Post-op Assessment: Report given to PACU RN and Post -op Vital signs reviewed and stable  Post vital signs: Reviewed and stable  Complications: No apparent anesthesia complications

## 2011-03-20 NOTE — Op Note (Signed)
PATIENT: Saadiya Wilfong Neuenfeldt DATE OF BIRTH: 01/13/1956 ENCOUNTER DATE: 03/20/2011   Preop Diagnosis: Grade 1 endometrioid adenocarcinoma.   Postoperative Diagnosis: same.   Surgery: Total robotic hysterectomy bilateral salpingo-oophorectomy right pelvic lymph node dissection.   Surgeons: Bernita Buffy. Duard Brady, MD; Antionette Char, MD   Assistant: Telford Nab, RN   Anesthesia: General   Estimated blood loss: 25 ml   IVF: 1500 ml   Urine output: 200 ml   Complications: None   Pathology: Uterus, cervix, bilateral tubes and ovaries, bilateral pelvic lymph nodes to pathology.  Operative findings: Globular uterus. Left sided hydrosalpinx. Adhesive disease of the rectosigmoid colon to the left  adnexa. Frozen section revealed a grade 1 endometrial adenocarcinoma with minimal myometrial invasion.   Procedure: The patient was identified in the preoperative holding area. Informed consent was signed on the chart. Patient was seen history was reviewed and exam was performed.  The patient was then taken to the operating room and placed in the supine position with SCD hose on. General anesthesia was then induced without difficulty. She was then placed in the dorsolithotomy position. Her arms were tucked at her side with appropriate precautions on the gel pad. She was then taped as we cannot get the shoulder blocks to fit appropriately. A OG-tube was placed to suction. First timeout was performed to confirm the patient procedure antibiotic allergy status, estimated estimated blood loss and OR time. The perineum was then prepped in the usual fashion with Betadine. A 14 French Foley was inserted into the bladder under sterile conditions. A sterile speculum was placed in the vagina. The cervix was without lesions. The cervix was grasped with a single-tooth tenaculum. The dilator without difficulty. A ZUMI with a large Koe ring was placed without difficulty. The abdomen was then prepped with 2 Chlor prep  sponges per protocol.   Patient was then draped after the prep was dried. Second timeout was performed to confirm the above. After again confirming OG tube placement and it was to suction. A stab-wound was made in left upper quadrant 2 cm below the costal margin on the left in the midclavicular line. A 5 mm operative report was used to assure intra-abdominal placement. The abdomen was insufflated. At this point all points during the procedure the patient's intra-abdominal pressure was not increased over 15 mm of mercury. After insufflation was complete, the patient was placed in deep Trendelenburg position. 25 cm above the pubic symphysis that area was marked the camera port. Bilateral robotic ports were marked 10 cm from the midline incision at approximately 5 angle. Under direct visualization each of the trochars was placed into the abdomen. The small bowel was folded on its mesentery to allow visualization to the pelvis. After assuring adequate visualization, the robot was then talked in the usual fashion. Under direct visualization the robotic instruments replaced.   The adhesive disease of the left adnexa to the rectosigmoid colon was taken down using meticulous sharp dissection. The round ligament on the patient's left side was transected with monopolar cautery. The anterior and posterior leaves of the broad ligament were then taken down in the usual fashion. The ureter was identified on the medial leaf of the broad ligament. A window was made between [facility] and the ureter. The IP was coagulated with bipolar cautery and transected. The posterior leaf of the broad ligament was taken down to the level of the KOH ring. The bladder flap was created. There is adhesive disease of the bladder to the superior aspect of  the uterus. Therefore great care was taken down to ensure that the bladder flap was adequate. The uterine vessels were coagulated with bipolar cautery. The uterine vessels were then transected and the  C loop was created. The same procedure was performed on the patient's right side.   The pneumo-occulder in the vagina was then insufflated. The colpotomy was then created in the usual fashion. The specimen was then delivered to the vagina and sent for frozen section there was some bleeding noted on the uterine vessels on her left side. These were coagulated with bipolar cautery. Our attention was then drawn to opening the paravesical space on her right side the perirectal space was also opened. The obturator nerve was identified. The nodal bundle extending over the external iliac artery down to the external iliac vein was taken down using sharp dissection and monopolar cautery. The genitofemoral nerve was identified and spared. We continued our dissection down to the level of the obturator nerve. The nodal bundle superior to the obturator nerve was taken. All pedicles were noted to be hemostatic the ureter was noted to be well medial of the area of dissection. The same procedure was then performed on her left side.  The nodal bundle was then placed and an Endo catch bag. All specimens were delivered to the vagina. At this point frozen section returned as minimal myometrial invasion of grade 1 lesion. The decision was made to stop the procedure is no further lymphadenectomy was indicated.   The vaginal cuff was closed with a running 0 Vicryl on PT 1 suture. The abdomen and pelvis were copiously irrigated and noted to be hemostatic. The robotic instruments were removed under direct visualization as were the robotic trochars. The pneumoperitoneum was removed. The patient was then taken out of the Trendelenburg position. Using of 0 Vicryl on a UR 6 needle the midline port port in the left upper quadrant ports were reapproximated. The skin was closed using 4-0 Vicryl. Steri-Strips and benzoin were applied. The pneumo occluded balloon was removed from the vagina. The vagina was swabbed and noted to be hemostatic.    All instrument needle and Ray-Tec counts were correct x2. The patient tolerated the procedure well and was taken to the recovery room in stable condition. This is Cleda Mccreedy dictating an operative note on patient PIYA MESCH.

## 2011-03-20 NOTE — H&P (Signed)
Patient seen and examined by me.  No changes to history and physical.  Risks/benefits/expected outcomes reviewed.

## 2011-03-20 NOTE — H&P (Deleted)
See above.. Patient re-evalutated

## 2011-03-21 DIAGNOSIS — C549 Malignant neoplasm of corpus uteri, unspecified: Secondary | ICD-10-CM | POA: Diagnosis present

## 2011-03-21 HISTORY — DX: Malignant neoplasm of corpus uteri, unspecified: C54.9

## 2011-03-21 LAB — CBC
HCT: 38.7 % (ref 36.0–46.0)
Hemoglobin: 12.6 g/dL (ref 12.0–15.0)
MCH: 29.2 pg (ref 26.0–34.0)
MCHC: 32.6 g/dL (ref 30.0–36.0)
MCV: 89.6 fL (ref 78.0–100.0)
Platelets: 239 10*3/uL (ref 150–400)
RBC: 4.32 MIL/uL (ref 3.87–5.11)
RDW: 13 % (ref 11.5–15.5)
WBC: 11.9 10*3/uL — ABNORMAL HIGH (ref 4.0–10.5)

## 2011-03-21 MED ORDER — HYDROMORPHONE HCL 2 MG PO TABS: 2.0000 mg | ORAL_TABLET | ORAL | Status: AC | PRN

## 2011-03-21 NOTE — Discharge Summary (Signed)
Physician Discharge Summary  Patient ID: Jamie [name] MRN: [id number] DOB/AGE: Nov 04, 1955 55 y.o.  Admit date: 03/20/2011 Discharge date: 03/21/2011  Admission Diagnoses:    Malignant neoplasm of body of uterus   Discharge Diagnoses:  Active Problems:  Malignant neoplasm of body of uterus   Discharged Condition: good  Hospital Course: The patient underwent a TRH/BSO/PND.  Her postoperative course was uneventful.  Consults: none  Significant Diagnostic Studies: None.  Treatments: surgery  Discharge Exam: Blood pressure 116/72, pulse 75, temperature 99.1 F (37.3 C), temperature source Oral, resp. rate 18, height 5\' 2"  (1.575 m), weight 61.871 kg (136 lb 6.4 oz), SpO2 95.00%. General appearance: alert GI: soft, non-tender; bowel sounds normal; no masses,  no organomegaly Extremities: extremities normal, atraumatic, no cyanosis or edema  Disposition:   Discharge Orders    Future Appointments: Provider: Department: Dept Phone: Center:   05/11/2011 12:45 PM Jeannette Corpus, MD [facility] [phone] None     Future Orders Please Complete By Expires   Diet - low sodium heart healthy      Diet Carb Modified      Increase activity slowly      May walk up steps      Driving Restrictions      Comments:   No driving x 1 week.   Lifting restrictions      Comments:   No lifting for 6 weeks.   Sexual Activity Restrictions      Comments:   No intercourse for 8 weeks.   Discharge wound care:      Comments:   Keep clean and dry   Call MD for:  temperature >100.4      Call MD for:  persistant nausea and vomiting      Call MD for:  severe uncontrolled pain      Call MD for:  redness, tenderness, or signs of infection (pain, swelling, redness, odor or green/yellow discharge around incision site)      Call MD for:  persistant dizziness or light-headedness        Current Discharge Medication List    START taking these medications   Details  HYDROmorphone  (DILAUDID) 2 MG tablet Take 1 tablet (2 mg total) by mouth every 4 (four) hours as needed. Qty: 30 tablet, Refills: 0      CONTINUE these medications which have NOT CHANGED   Details  ALPRAZolam (XANAX) 0.5 MG tablet Take 0.5 mg by mouth at bedtime as needed. ANXIETY     Calcium Carbonate-Vitamin D (CALTRATE 600+D) 600-400 MG-UNIT per tablet Take 1 tablet by mouth daily.     metFORMIN (GLUCOPHAGE) 500 MG tablet Take 1 tablet (500 mg total) by mouth 2 (two) times daily with a meal. Office visit is needed for refills Qty: 60 tablet, Refills: 3    omeprazole (PRILOSEC) 20 MG capsule TAKE ONE (1) CAPSULE(S) TWICE DAILY Qty: 60 capsule, Refills: 3    Pseudoeph-Doxylamine-DM-APAP (NYQUIL) 60-7.10-03-998 MG/30ML LIQD Take 30 mLs by mouth at bedtime as needed.     simvastatin (ZOCOR) 20 MG tablet Take 1 tablet (20 mg total) by mouth at bedtime. Qty: 90 tablet, Refills: 1    celecoxib (CELEBREX) 200 MG capsule Take 200 mg by mouth daily as needed. PAIN    Multiple Vitamins-Minerals (MULTIVITAMIN WITH MINERALS) tablet Take 1 tablet by mouth daily.     terconazole (TERAZOL 7) 0.4 % vaginal cream Place 1 applicator vaginally at bedtime.         Follow-up Information  Follow up with Jeannette Corpus, MD on 05/11/2011. (at 12:45)    Contact information:   [address] [place] [address] [phone]          Signed: Roseanna Rainbow 03/21/2011, 9:11 AM

## 2011-03-21 NOTE — Progress Notes (Signed)
1 Day Post-Op Procedure(s) (LRB): ROBOTIC ASSISTED TOTAL HYSTERECTOMY WITH SALPINGO OOPHERECTOMY (Bilateral)  Subjective: Patient reports no complaints.    Objective: Vital signs in last 24 hours: Temp:  [97 F (36.1 C)-99.8 F (37.7 C)] 99.1 F (37.3 C) (11/14 0555) Pulse Rate:  [75-96] 75  (11/14 0155) Resp:  [10-20] 18  (11/14 0555) BP: (107-146)/(57-77) 116/72 mmHg (11/14 0555) SpO2:  [92 %-100 %] 95 % (11/14 0555) Weight:  [61.871 kg (136 lb 6.4 oz)] 136 lb 6.4 oz (61.871 kg) (11/13 1900) Last BM Date: 03/20/11  Intake/Output from previous day: 11/13 0701 - 11/14 0700 In: 3587 [I.V.:3587] Out: 3876 [Urine:3650; Emesis/NG output:201; Blood:25]     Physical Examination: General: alert and cooperative GI: soft, non-tender; bowel sounds normal; no masses,  no organomegaly and incision: clean, dry and intact Extremities: extremities normal, atraumatic, no cyanosis or edema  Labs: WBC/Hgb/Hct/Plts:  17.4/13.6/40.3/255 (11/13 1428)     Assessment:  56 y.o. s/p Procedure(s): ROBOTIC ASSISTED TOTAL HYSTERECTOMY WITH SALPINGO OOPHERECTOMY: progressing well Pain:  Pain is well-controlled on oral medications.  GI:  Tolerating po: Yes    Prophylaxis: intermittent pneumatic compression boots.  Plan: Advance diet Discharge home    LOS: 1 day    JACKSON-MOORE,Daizee Firmin A 03/21/2011, 8:56 AM

## 2011-03-29 HISTORY — PX: TOTAL VAGINAL HYSTERECTOMY: SHX2548

## 2011-05-09 ENCOUNTER — Encounter: Payer: Self-pay | Admitting: Gynecologic Oncology

## 2011-05-11 ENCOUNTER — Other Ambulatory Visit: Payer: Self-pay | Admitting: Gynecology

## 2011-05-11 ENCOUNTER — Encounter: Payer: Self-pay | Admitting: Gynecology

## 2011-05-11 ENCOUNTER — Ambulatory Visit: Payer: 59 | Attending: Gynecology | Admitting: Gynecology

## 2011-05-11 ENCOUNTER — Ambulatory Visit (HOSPITAL_COMMUNITY)
Admission: RE | Admit: 2011-05-11 | Discharge: 2011-05-11 | Disposition: A | Discharge status: Home / Self Care | Payer: 59 | Source: Ambulatory Visit | Attending: Gynecology | Admitting: Gynecology

## 2011-05-11 VITALS — BP 122/80 | HR 62 | Temp 98.4°F | Resp 16 | Ht 63.98 in | Wt 137.5 lb

## 2011-05-11 DIAGNOSIS — Z8049 Family history of malignant neoplasm of other genital organs: Secondary | ICD-10-CM | POA: Insufficient documentation

## 2011-05-11 DIAGNOSIS — Z90710 Acquired absence of both cervix and uterus: Secondary | ICD-10-CM | POA: Insufficient documentation

## 2011-05-11 DIAGNOSIS — Z800 Family history of malignant neoplasm of digestive organs: Secondary | ICD-10-CM | POA: Insufficient documentation

## 2011-05-11 DIAGNOSIS — C569 Malignant neoplasm of unspecified ovary: Secondary | ICD-10-CM | POA: Insufficient documentation

## 2011-05-11 DIAGNOSIS — C549 Malignant neoplasm of corpus uteri, unspecified: Secondary | ICD-10-CM | POA: Insufficient documentation

## 2011-05-11 DIAGNOSIS — Z9079 Acquired absence of other genital organ(s): Secondary | ICD-10-CM | POA: Insufficient documentation

## 2011-05-11 DIAGNOSIS — C541 Malignant neoplasm of endometrium: Secondary | ICD-10-CM

## 2011-05-11 DIAGNOSIS — Z79899 Other long term (current) drug therapy: Secondary | ICD-10-CM | POA: Insufficient documentation

## 2011-05-11 DIAGNOSIS — Z803 Family history of malignant neoplasm of breast: Secondary | ICD-10-CM | POA: Insufficient documentation

## 2011-05-11 DIAGNOSIS — K219 Gastro-esophageal reflux disease without esophagitis: Secondary | ICD-10-CM | POA: Insufficient documentation

## 2011-05-11 DIAGNOSIS — E785 Hyperlipidemia, unspecified: Secondary | ICD-10-CM | POA: Insufficient documentation

## 2011-05-11 DIAGNOSIS — Z8542 Personal history of malignant neoplasm of other parts of uterus: Secondary | ICD-10-CM | POA: Insufficient documentation

## 2011-05-11 DIAGNOSIS — R1909 Other intra-abdominal and pelvic swelling, mass and lump: Secondary | ICD-10-CM | POA: Insufficient documentation

## 2011-05-11 DIAGNOSIS — Z8041 Family history of malignant neoplasm of ovary: Secondary | ICD-10-CM | POA: Insufficient documentation

## 2011-05-11 NOTE — Progress Notes (Signed)
Consult Note: Gyn-Onc   Jamie [name] 56 y.o. female  Chief Complaint  Patient presents with  . Endo cancer    Follow up    Interval History: The patient returns today for her first postoperative followup visit. Since hospital discharge she's done well and actually is return to work. She has no GI or GU symptoms although she does have some suprapubic discomfort. She also had some vaginal spotting approximately a week ago. She denies any fever chills or  HPI: The patient underwent a robotic hysterectomy, bilateral salpingo-oophorectomy, and pelvic lymphadenectomy on 03/20/2011 for treatment of an endometrial carcinoma. Final pathology showed a grade 1 endometrial cancer confined to the inner half of the myometrium. In addition an adenocarcinoma was also found in the left ovary. Review of the pathology report indicates that this appears to be 2 synchronous lesions. With regard to the endometrial lesion it appears to be low risk and no additional therapy would be recommended. Further, the ovarian lesion is confined to the ovarian parenchyma and there is no other evidence of metastatic disease (stage IA grade 1)  Allergies  Allergen Reactions  . Tylox Itching and Swelling    Past Medical History  Diagnosis Date  . GERD (gastroesophageal reflux disease)   . History of cervical dysplasia   . Ulcer     history of gastritis  . Allergy   . Hyperlipidemia   . PONV (postoperative nausea and vomiting)     ONLY AFTER CERVICAL FUSION--NO PROB WITH OTHER SURGERIES  . Diabetes mellitus     ORAL MEDS-NO INSULIN  . Hiatal hernia   . Endometrial cancer     Past Surgical History  Procedure Date  . Cervical fusion 09/2003  . Lumbar fusion 11/1998  . Gynecologic cryosurgery 02/2003  . Achilles tendon surgery     MULTIPLE SURGERIES BECAUSE OF STAPH INFECTION    Current Outpatient Prescriptions  Medication Sig Dispense Refill  . ALPRAZolam (XANAX) 0.5 MG tablet Take 0.5 mg by mouth at  bedtime as needed. ANXIETY       . Calcium Carbonate-Vitamin D (CALTRATE 600+D) 600-400 MG-UNIT per tablet Take 1 tablet by mouth daily.       . metFORMIN (GLUCOPHAGE) 500 MG tablet Take 1 tablet (500 mg total) by mouth 2 (two) times daily with a meal. Office visit is needed for refills  60 tablet  3  . Multiple Vitamins-Minerals (MULTIVITAMIN WITH MINERALS) tablet Take 1 tablet by mouth daily.       Marland Kitchen omeprazole (PRILOSEC) 20 MG capsule TAKE ONE (1) CAPSULE(S) TWICE DAILY  60 capsule  3  . simvastatin (ZOCOR) 20 MG tablet Take 1 tablet (20 mg total) by mouth at bedtime.  90 tablet  1  . celecoxib (CELEBREX) 200 MG capsule Take 200 mg by mouth daily as needed. PAIN      . Pseudoeph-Doxylamine-DM-APAP (NYQUIL) 60-7.10-03-998 MG/30ML LIQD Take 30 mLs by mouth at bedtime as needed.       Marland Kitchen terconazole (TERAZOL 7) 0.4 % vaginal cream Place 1 applicator vaginally at bedtime.          History   Social History  . Marital Status: Married    Spouse Name: N/A    Number of Children: 1  . 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 Smoking status: Never Smoker   . Smokeless tobacco: Never Used  . Alcohol Use: 0.5 oz/week  1 drink(s) per week     occassional  . Drug Use: No  . Sexually Active: Not Currently     because of surgery   Other Topics Concern  . Not on file   Social History Narrative   1 step son Regular exercise- (walking)    Family History  Problem Relation Age of Onset  . Ovarian cancer Cousin   . Uterine cancer Cousin   . Breast cancer Maternal Aunt   . Colon cancer Other     Review of Systems: 10 point review of systems negative except as noted above  Vitals: Blood pressure 122/80, pulse 62, temperature 98.4 F (36.9 C), temperature source Oral, resp. rate 16, height 5' 3.98" (1.625 m), weight 137 lb 8 oz (62.37 kg).  Physical Exam: General this a healthy white female no acute distress  HEENT is  negative  Neck is supple without thyromegaly  There is no supraclavicular or inguinal adenopathy  The abdomen is soft although there's some suprapubic fullness and discomfort on palpation. No evidence of ascites or organomegaly.  Pelvic exam EGBUS vagina bladder urethra are normal the vaginal cuff is healing well  On bimanual examination there is a cystic mass measuring approximately 10 x 4 x 5 cm in the region of the bladder.  A straight catheter the bladder was performed sterilely revealing only 3 cc residual urine.  Lower extremities without edema or varicosities  Assessment/Plan:  Stage IA grade 1 endometrial cancer. Stage IA grade 1 ovarian cancer  The patient was sent for ultrasound to evaluate the pelvic mass. This appears to be a hematoma.  I reviewed the ultrasound findings with the patient and her husband and would like to reexamine her in approximately 4-6 weeks. In the interval we'll ask her to abstain from intercourse. She should also report any new symptoms including fever. She is warned that she may drain this hematoma spontaneously through the vagina and she contact us if she started having any increased vaginal bleeding.  She will return to see [place] on February 11 or as needed.   Jamie Corpus, MD 05/11/2011, 4:26 PM                         Consult Note: Gyn-Onc   Jamie [name] 56 y.o. female  Chief Complaint  Patient presents with  . Endo cancer    Follow up    Interval History:   HPI:  Allergies  Allergen Reactions  . Tylox Itching and Swelling    Past Medical History  Diagnosis Date  . GERD (gastroesophageal reflux disease)   . History of cervical dysplasia   . Ulcer     history of gastritis  . Allergy   . Hyperlipidemia   . PONV (postoperative nausea and vomiting)     ONLY AFTER CERVICAL FUSION--NO PROB WITH OTHER SURGERIES  . Diabetes mellitus     ORAL MEDS-NO INSULIN  . Hiatal hernia   . Endometrial  cancer     Past Surgical History  Procedure Date  . Cervical fusion 09/2003  . Lumbar fusion 11/1998  . Gynecologic cryosurgery 02/2003  . Achilles tendon surgery     MULTIPLE SURGERIES BECAUSE OF STAPH INFECTION    Current Outpatient Prescriptions  Medication Sig Dispense Refill  . ALPRAZolam (XANAX) 0.5 MG tablet Take 0.5 mg by mouth at bedtime as needed. ANXIETY       . Calcium Carbonate-Vitamin D (CALTRATE 600+D) 600-400 MG-UNIT per tablet Take 1  tablet by mouth daily.       . metFORMIN (GLUCOPHAGE) 500 MG tablet Take 1 tablet (500 mg total) by mouth 2 (two) times daily with a meal. Office visit is needed for refills  60 tablet  3  . Multiple Vitamins-Minerals (MULTIVITAMIN WITH MINERALS) tablet Take 1 tablet by mouth daily.       Marland Kitchen omeprazole (PRILOSEC) 20 MG capsule TAKE ONE (1) CAPSULE(S) TWICE DAILY  60 capsule  3  . simvastatin (ZOCOR) 20 MG tablet Take 1 tablet (20 mg total) by mouth at bedtime.  90 tablet  1  . celecoxib (CELEBREX) 200 MG capsule Take 200 mg by mouth daily as needed. PAIN      . Pseudoeph-Doxylamine-DM-APAP (NYQUIL) 60-7.10-03-998 MG/30ML LIQD Take 30 mLs by mouth at bedtime as needed.       Marland Kitchen terconazole (TERAZOL 7) 0.4 % vaginal cream Place 1 applicator vaginally at bedtime.          History   Social History  . Marital Status: Married    Spouse Name: N/A    Number of Children: 1  . 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 Smoking status: Never Smoker   . Smokeless tobacco: Never Used  . Alcohol Use: 0.5 oz/week    1 drink(s) per week     occassional  . Drug Use: No  . Sexually Active: Not Currently     because of surgery   Other Topics Concern  . Not on file   Social History Narrative   1 step son Regular exercise- (walking)    Family History  Problem Relation Age of Onset  . Ovarian cancer Cousin   . Uterine cancer Cousin   . Breast cancer Maternal Aunt   . Colon cancer  Other     Review of Systems:  Vitals: Blood pressure 122/80, pulse 62, temperature 98.4 F (36.9 C), temperature source Oral, resp. rate 16, height 5' 3.98" (1.625 m), weight 137 lb 8 oz (62.37 kg).  Physical Exam:  Assessment/Plan:   Jamie Corpus, MD 05/11/2011, 4:26 PM

## 2011-05-11 NOTE — Patient Instructions (Signed)
Return on February 11 as scheduled. Contact us if she if you have any bleeding fever or any worsening symptoms.

## 2011-05-14 ENCOUNTER — Other Ambulatory Visit (HOSPITAL_COMMUNITY): Payer: 59

## 2011-06-05 ENCOUNTER — Other Ambulatory Visit: Payer: Self-pay | Admitting: *Deleted

## 2011-06-05 MED ORDER — OMEPRAZOLE 20 MG PO CPDR: 20.0000 mg | DELAYED_RELEASE_CAPSULE | Freq: Every day | ORAL | Status: DC

## 2011-06-18 ENCOUNTER — Ambulatory Visit: Payer: 59 | Attending: Gynecology | Admitting: Gynecology

## 2011-06-18 ENCOUNTER — Encounter: Payer: Self-pay | Admitting: Gynecology

## 2011-06-18 DIAGNOSIS — E785 Hyperlipidemia, unspecified: Secondary | ICD-10-CM | POA: Insufficient documentation

## 2011-06-18 DIAGNOSIS — IMO0002 Reserved for concepts with insufficient information to code with codable children: Secondary | ICD-10-CM | POA: Insufficient documentation

## 2011-06-18 DIAGNOSIS — Z79899 Other long term (current) drug therapy: Secondary | ICD-10-CM | POA: Insufficient documentation

## 2011-06-18 DIAGNOSIS — E119 Type 2 diabetes mellitus without complications: Secondary | ICD-10-CM | POA: Insufficient documentation

## 2011-06-18 DIAGNOSIS — Y838 Other surgical procedures as the cause of abnormal reaction of the patient, or of later complication, without mention of misadventure at the time of the procedure: Secondary | ICD-10-CM | POA: Insufficient documentation

## 2011-06-18 DIAGNOSIS — Z90710 Acquired absence of both cervix and uterus: Secondary | ICD-10-CM | POA: Insufficient documentation

## 2011-06-18 DIAGNOSIS — K219 Gastro-esophageal reflux disease without esophagitis: Secondary | ICD-10-CM | POA: Insufficient documentation

## 2011-06-18 DIAGNOSIS — C549 Malignant neoplasm of corpus uteri, unspecified: Secondary | ICD-10-CM | POA: Insufficient documentation

## 2011-06-18 DIAGNOSIS — Z9079 Acquired absence of other genital organ(s): Secondary | ICD-10-CM | POA: Insufficient documentation

## 2011-06-18 NOTE — Patient Instructions (Signed)
Please return in approximately 6 weeks for reevaluation. Contact us if she had any increasing pain fever or chills.

## 2011-06-18 NOTE — Progress Notes (Signed)
Consult Note: Gyn-Onc   Jamie [name] 56 y.o. female  Chief Complaint  Patient presents with  . Endometrial cancer    Follow up    Interval History: The patient returns today as previously scheduled for followup of a pelvic hematoma. Since her most recent visit she is felt better although she still has some left-sided discomfort when she is laying on her left side. She denies any GI or GU symptoms. She has not had any vaginal bleeding or significant discharge. She denies any fever or chills.  HPI:The patient underwent a robotic hysterectomy, bilateral salpingo-oophorectomy, and pelvic lymphadenectomy on 03/20/2011 for treatment of an endometrial carcinoma. Final pathology showed a grade 1 endometrial cancer confined to the inner half of the myometrium. In addition an adenocarcinoma was also found in the left ovary. Review of the pathology report indicates that this appears to be 2 synchronous lesions. With regard to the endometrial lesion it appears to be low risk and no additional therapy would be recommended. Further, the ovarian lesion is confined to the ovarian parenchyma and there is no other evidence of metastatic disease (stage IA grade 1) . At first postoperative followup a pelvic hematoma was identified on bimanual exam and confirmed with ultrasound.   Allergies  Allergen Reactions  . Tylox Itching and Swelling    Past Medical History  Diagnosis Date  . GERD (gastroesophageal reflux disease)   . History of cervical dysplasia   . Ulcer     history of gastritis  . Allergy   . Hyperlipidemia   . PONV (postoperative nausea and vomiting)     ONLY AFTER CERVICAL FUSION--NO PROB WITH OTHER SURGERIES  . Diabetes mellitus     ORAL MEDS-NO INSULIN  . Hiatal hernia   . Endometrial cancer     Past Surgical History  Procedure Date  . Cervical fusion 09/2003  . Lumbar fusion 11/1998  . Gynecologic cryosurgery 02/2003  . Achilles tendon surgery     MULTIPLE SURGERIES BECAUSE  OF STAPH INFECTION    Current Outpatient Prescriptions  Medication Sig Dispense Refill  . ALPRAZolam (XANAX) 0.5 MG tablet Take 0.5 mg by mouth at bedtime as needed. ANXIETY       . Calcium Carbonate-Vitamin D (CALTRATE 600+D) 600-400 MG-UNIT per tablet Take 1 tablet by mouth daily.       . metFORMIN (GLUCOPHAGE) 500 MG tablet Take 1 tablet (500 mg total) by mouth 2 (two) times daily with a meal. Office visit is needed for refills  60 tablet  3  . Multiple Vitamins-Minerals (MULTIVITAMIN WITH MINERALS) tablet Take 1 tablet by mouth daily.       Marland Kitchen omeprazole (PRILOSEC) 20 MG capsule Take 1 capsule (20 mg total) by mouth daily.  60 capsule  0  . simvastatin (ZOCOR) 20 MG tablet Take 1 tablet (20 mg total) by mouth at bedtime.  90 tablet  1  . celecoxib (CELEBREX) 200 MG capsule Take 200 mg by mouth daily as needed. PAIN      . Pseudoeph-Doxylamine-DM-APAP (NYQUIL) 60-7.10-03-998 MG/30ML LIQD Take 30 mLs by mouth at bedtime as needed.       Marland Kitchen terconazole (TERAZOL 7) 0.4 % vaginal cream Place 1 applicator vaginally at bedtime.          History   Social History  . Marital Status: Married    Spouse Name: N/A    Number of Children: 1  . Years of Education: N/A   Occupational History  .  receptionist   Social History Main Topics  . Smoking status: Never Smoker   . Smokeless tobacco: Never Used  . Alcohol Use: 0.5 oz/week    1 drink(s) per week     occassional  . Drug Use: No  . Sexually Active: Not Currently     because of surgery   Other Topics Concern  . Not on file   Social History Narrative   1 step son Regular exercise- (walking)    Family History  Problem Relation Age of Onset  . Ovarian cancer Cousin   . Uterine cancer Cousin   . Breast cancer Maternal Aunt   . Colon cancer Other     Review of Systems:  Vitals: Blood pressure 120/70, pulse 80, temperature 98.1 F (36.7 C), temperature source Oral, resp. rate 18, height 4' 11.84" (1.52 m), weight 137 lb  (62.143 kg).  Physical Exam:  Assessment/Plan:   Jeannette Corpus, MD 06/18/2011, 5:00 PM                         Consult Note: Gyn-Onc   Jamie [name] 56 y.o. female  Chief Complaint  Patient presents with  . Endometrial cancer    Follow up    Interval History:   HPI:  Allergies  Allergen Reactions  . Tylox Itching and Swelling    Past Medical History  Diagnosis Date  . GERD (gastroesophageal reflux disease)   . History of cervical dysplasia   . Ulcer     history of gastritis  . Allergy   . Hyperlipidemia   . PONV (postoperative nausea and vomiting)     ONLY AFTER CERVICAL FUSION--NO PROB WITH OTHER SURGERIES  . Diabetes mellitus     ORAL MEDS-NO INSULIN  . Hiatal hernia   . Endometrial cancer     Past Surgical History  Procedure Date  . Cervical fusion 09/2003  . Lumbar fusion 11/1998  . Gynecologic cryosurgery 02/2003  . Achilles tendon surgery     MULTIPLE SURGERIES BECAUSE OF STAPH INFECTION    Current Outpatient Prescriptions  Medication Sig Dispense Refill  . ALPRAZolam (XANAX) 0.5 MG tablet Take 0.5 mg by mouth at bedtime as needed. ANXIETY       . Calcium Carbonate-Vitamin D (CALTRATE 600+D) 600-400 MG-UNIT per tablet Take 1 tablet by mouth daily.       . metFORMIN (GLUCOPHAGE) 500 MG tablet Take 1 tablet (500 mg total) by mouth 2 (two) times daily with a meal. Office visit is needed for refills  60 tablet  3  . Multiple Vitamins-Minerals (MULTIVITAMIN WITH MINERALS) tablet Take 1 tablet by mouth daily.       Marland Kitchen omeprazole (PRILOSEC) 20 MG capsule Take 1 capsule (20 mg total) by mouth daily.  60 capsule  0  . simvastatin (ZOCOR) 20 MG tablet Take 1 tablet (20 mg total) by mouth at bedtime.  90 tablet  1  . celecoxib (CELEBREX) 200 MG capsule Take 200 mg by mouth daily as needed. PAIN      . Pseudoeph-Doxylamine-DM-APAP (NYQUIL) 60-7.10-03-998 MG/30ML LIQD Take 30 mLs by mouth at bedtime as needed.       Marland Kitchen  terconazole (TERAZOL 7) 0.4 % vaginal cream Place 1 applicator vaginally at bedtime.          History   Social History  . Marital Status: Married    Spouse Name: N/A    Number of Children: 1  . Years of Education: N/A  Occupational History  .      receptionist   Social History Main Topics  . Smoking status: Never Smoker   . Smokeless tobacco: Never Used  . Alcohol Use: 0.5 oz/week    1 drink(s) per week     occassional  . Drug Use: No  . Sexually Active: Not Currently     because of surgery   Other Topics Concern  . Not on file   Social History Narrative   1 step son Regular exercise- (walking)    Family History  Problem Relation Age of Onset  . Ovarian cancer Cousin   . Uterine cancer Cousin   . Breast cancer Maternal Aunt   . Colon cancer Other     Review of Systems: 10 point review of systems is negative except as noted above  Vitals: Blood pressure 120/70, pulse 80, temperature 98.1 F (36.7 C), temperature source Oral, resp. rate 18, height 4' 11.84" (1.52 m), weight 137 lb (62.143 kg).   Physical Exam: General this a healthy white female no acute distress  HEENT is negative  Neck is supple without thyromegaly  There is no supraclavicular or inguinal adenopathy  The abdomen is soft although there's some suprapubic fullness and discomfort on palpation. No evidence of ascites or organomegaly.  Pelvic exam EGBUS vagina bladder urethra are normal the vaginal cuff is healing well  On bimanual examination there is a cystic mass measuring approximately 6 x 4 x 5 cm in the region of the bladder. This is smaller than on previous exam. Masses nontender   Lower extremities without edema or varicosities  Assessment/Plan:  Stage IA grade 1 endometrial cancer. Stage IA grade 1 ovarian cancer   Resolving pelvic hematoma. She should also report any new symptoms including fever. She is warned that she may drain this hematoma spontaneously through the vagina and she  contact us if she started having any increased vaginal bleeding.  She will return to see [place] in approximately 6 weeks for reevaluation or as needed depending upon symptoms.Marland Kitchen    Jeannette Corpus, MD 06/18/2011, 5:00 PM

## 2011-06-25 ENCOUNTER — Telehealth: Payer: Self-pay | Admitting: Internal Medicine

## 2011-06-25 NOTE — Telephone Encounter (Signed)
Pt is on chole med requesting blood work prior to making ov. What labs can I sch?

## 2011-06-25 NOTE — Telephone Encounter (Signed)
Pt is sch for labs on 07-03-2011 8am

## 2011-06-25 NOTE — Telephone Encounter (Signed)
BMET, A1c - 250.00 Fasting lipid panel, LFTs - 272.4

## 2011-07-03 ENCOUNTER — Other Ambulatory Visit (INDEPENDENT_AMBULATORY_CARE_PROVIDER_SITE_OTHER): Payer: 59

## 2011-07-03 DIAGNOSIS — E119 Type 2 diabetes mellitus without complications: Secondary | ICD-10-CM

## 2011-07-03 DIAGNOSIS — E785 Hyperlipidemia, unspecified: Secondary | ICD-10-CM

## 2011-07-03 LAB — BASIC METABOLIC PANEL
BUN: 13 mg/dL (ref 6–23)
CO2: 26 mEq/L (ref 19–32)
Calcium: 9.3 mg/dL (ref 8.4–10.5)
Chloride: 107 mEq/L (ref 96–112)
Creatinine, Ser: 0.8 mg/dL (ref 0.4–1.2)
GFR: 78.77 mL/min (ref >60.00)
Glucose, Bld: 120 mg/dL — ABNORMAL HIGH (ref 70–99)
Potassium: 4.3 mEq/L (ref 3.5–5.1)
Sodium: 141 mEq/L (ref 135–145)

## 2011-07-03 LAB — HEPATIC FUNCTION PANEL
ALT: 23 U/L (ref 0–35)
AST: 22 U/L (ref 0–37)
Albumin: 4 g/dL (ref 3.5–5.2)
Alkaline Phosphatase: 71 U/L (ref 39–117)
Bilirubin, Direct: 0 mg/dL (ref 0.0–0.3)
Total Bilirubin: 0.5 mg/dL (ref 0.3–1.2)
Total Protein: 6.6 g/dL (ref 6.0–8.3)

## 2011-07-03 LAB — HEMOGLOBIN A1C: Hgb A1c MFr Bld: 6.3 % (ref 4.6–6.5)

## 2011-07-03 LAB — LIPID PANEL
Cholesterol: 240 mg/dL — ABNORMAL HIGH (ref 0–200)
HDL: 42.1 mg/dL (ref >39.00)
Total CHOL/HDL Ratio: 6
Triglycerides: 286 mg/dL — ABNORMAL HIGH (ref 0.0–149.0)
VLDL: 57.2 mg/dL — ABNORMAL HIGH (ref 0.0–40.0)

## 2011-07-03 LAB — LDL CHOLESTEROL, DIRECT: Direct LDL: 136.6 mg/dL

## 2011-07-04 ENCOUNTER — Telehealth: Payer: Self-pay | Admitting: Internal Medicine

## 2011-07-04 MED ORDER — METFORMIN HCL 500 MG PO TABS: 500.0000 mg | ORAL_TABLET | Freq: Two times a day (BID) | ORAL | Status: DC

## 2011-07-04 NOTE — Telephone Encounter (Signed)
rx sent in electronically 

## 2011-07-04 NOTE — Telephone Encounter (Signed)
Patient called stating that she is scheduled for 07/06/11 but her metformin is out now and would like to have enough called in to last until Friday's appt. Please advise.

## 2011-07-06 ENCOUNTER — Encounter: Payer: Self-pay | Admitting: Internal Medicine

## 2011-07-06 ENCOUNTER — Ambulatory Visit (INDEPENDENT_AMBULATORY_CARE_PROVIDER_SITE_OTHER): Payer: 59 | Admitting: Internal Medicine

## 2011-07-06 ENCOUNTER — Other Ambulatory Visit: Payer: 59 | Admitting: Internal Medicine

## 2011-07-06 DIAGNOSIS — E785 Hyperlipidemia, unspecified: Secondary | ICD-10-CM

## 2011-07-06 DIAGNOSIS — E119 Type 2 diabetes mellitus without complications: Secondary | ICD-10-CM

## 2011-07-06 MED ORDER — SIMVASTATIN 20 MG PO TABS: 20.0000 mg | ORAL_TABLET | Freq: Every day | ORAL | Status: DC

## 2011-07-06 MED ORDER — METFORMIN HCL 500 MG PO TABS: 500.0000 mg | ORAL_TABLET | Freq: Two times a day (BID) | ORAL | Status: DC

## 2011-07-06 NOTE — Assessment & Plan Note (Signed)
The patient admits to sporadic compliance with her simvastatin. Urged compliance. Goal LDL less than 100. Lab Results  Component Value Date   CHOL 240* 07/03/2011   HDL 42.10 07/03/2011   LDLCALC 91 10/28/2008   LDLDIRECT 136.6 07/03/2011   TRIG 286.0* 07/03/2011   CHOLHDL 6 07/03/2011

## 2011-07-06 NOTE — Assessment & Plan Note (Signed)
Well controlled.  Continue metformin and regular exercise. Lab Results  Component Value Date   HGBA1C 6.3 07/03/2011

## 2011-07-06 NOTE — Patient Instructions (Signed)
Please complete the following lab tests before your next follow up appointment: BMET, A1c, microalbumin / cr ratio - 250.00 

## 2011-07-06 NOTE — Progress Notes (Signed)
Subjective:    Patient ID: Jamie [name], female    DOB: 1955-11-03, 56 y.o.   MRN: [id number]  HPI  A 55 year old white female with history of type 2 diabetes, hypertension and hyperlipidemia for followup. Overall she's been doing well in regards to her type 2 diabetes. Her blood sugars have been stable at home. Her weight has been stable.  Dietary compliance is fair  Interval medical history-patient was diagnosed with endometrial cancer. She underwent complete hysterectomy. Her left ovary turned out to be neoplastic as well. Unfortunately she was stage I in both endometrial and ovarian cancer. She will not require any adjuvant chemotherapy or radiation therapy.   Review of Systems Negative for chest pain.  She exercises semi regularly  Past Medical History  Diagnosis Date  . GERD (gastroesophageal reflux disease)   . History of cervical dysplasia   . Ulcer     history of gastritis  . Allergy   . Hyperlipidemia   . PONV (postoperative nausea and vomiting)     ONLY AFTER CERVICAL FUSION--NO PROB WITH OTHER SURGERIES  . Diabetes mellitus     ORAL MEDS-NO INSULIN  . Hiatal hernia   . Endometrial cancer 03/2011  . Ovarian cancer 03/2011    History   Social History  . Marital Status: Married    Spouse Name: N/A    Number of Children: 1  . 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 Smoking status: Never Smoker   . Smokeless tobacco: Never Used  . Alcohol Use: 0.5 oz/week    1 drink(s) per week     occassional  . Drug Use: No  . Sexually Active: Not Currently     because of surgery   Other Topics Concern  . Not on file   Social History Narrative   1 step son Regular exercise- (walking)    Past Surgical History  Procedure Date  . Cervical fusion 09/2003  . Lumbar fusion 11/1998  . Gynecologic cryosurgery 02/2003  . Achilles tendon surgery     MULTIPLE SURGERIES BECAUSE OF STAPH INFECTION     Family History  Problem Relation Age of Onset  . Ovarian cancer Cousin   . Uterine cancer Cousin   . Breast cancer Maternal Aunt   . Colon cancer Other     Allergies  Allergen Reactions  . Tylox Itching and Swelling    Current Outpatient Prescriptions on File Prior to Visit  Medication Sig Dispense Refill  . ALPRAZolam (XANAX) 0.5 MG tablet Take 0.5 mg by mouth at bedtime as needed. ANXIETY       . Calcium Carbonate-Vitamin D (CALTRATE 600+D) 600-400 MG-UNIT per tablet Take 1 tablet by mouth daily.       . celecoxib (CELEBREX) 200 MG capsule Take 200 mg by mouth daily as needed. PAIN      . Multiple Vitamins-Minerals (MULTIVITAMIN WITH MINERALS) tablet Take 1 tablet by mouth daily.       Marland Kitchen omeprazole (PRILOSEC) 20 MG capsule Take 1 capsule (20 mg total) by mouth daily.  60 capsule  0  . Pseudoeph-Doxylamine-DM-APAP (NYQUIL) 60-7.10-03-998 MG/30ML LIQD Take 30 mLs by mouth at bedtime as needed.       Marland Kitchen terconazole (TERAZOL 7) 0.4 % vaginal cream Place 1 applicator vaginally at bedtime.        . metFORMIN (GLUCOPHAGE) 500 MG tablet Take 1 tablet (500 mg total) by mouth 2 (  two) times daily with a meal. Office visit is needed for refills  180 tablet  1  . simvastatin (ZOCOR) 20 MG tablet Take 1 tablet (20 mg total) by mouth at bedtime.  90 tablet  1    BP 124/62  Temp(Src) 98.2 F (36.8 C) (Oral)  Ht 5\' 2"  (1.575 m)  Wt 140 lb (63.504 kg)  BMI 25.61 kg/m2       Objective:   Physical Exam  Constitutional: She is oriented to person, place, and time. She appears well-developed and well-nourished.  Cardiovascular: Normal rate, regular rhythm and normal heart sounds.   Pulmonary/Chest: Effort normal and breath sounds normal. She has no wheezes. She has no rales.  Musculoskeletal: She exhibits no edema.  Neurological: She is alert and oriented to person, place, and time.  Skin: Skin is dry.  Psychiatric: She has a normal mood and affect. Her behavior is normal.           Assessment & Plan:

## 2011-07-27 ENCOUNTER — Encounter: Payer: Self-pay | Admitting: Gynecology

## 2011-07-27 ENCOUNTER — Ambulatory Visit: Payer: 59 | Attending: Gynecology | Admitting: Gynecology

## 2011-07-27 VITALS — BP 122/68 | HR 74 | Temp 98.0°F | Resp 20 | Ht 61.26 in | Wt 138.4 lb

## 2011-07-27 DIAGNOSIS — Z90710 Acquired absence of both cervix and uterus: Secondary | ICD-10-CM | POA: Insufficient documentation

## 2011-07-27 DIAGNOSIS — K219 Gastro-esophageal reflux disease without esophagitis: Secondary | ICD-10-CM | POA: Insufficient documentation

## 2011-07-27 DIAGNOSIS — Z9079 Acquired absence of other genital organ(s): Secondary | ICD-10-CM | POA: Insufficient documentation

## 2011-07-27 DIAGNOSIS — E785 Hyperlipidemia, unspecified: Secondary | ICD-10-CM | POA: Insufficient documentation

## 2011-07-27 DIAGNOSIS — C549 Malignant neoplasm of corpus uteri, unspecified: Secondary | ICD-10-CM | POA: Insufficient documentation

## 2011-07-27 DIAGNOSIS — E119 Type 2 diabetes mellitus without complications: Secondary | ICD-10-CM | POA: Insufficient documentation

## 2011-07-27 DIAGNOSIS — C541 Malignant neoplasm of endometrium: Secondary | ICD-10-CM

## 2011-07-27 DIAGNOSIS — Z79899 Other long term (current) drug therapy: Secondary | ICD-10-CM | POA: Insufficient documentation

## 2011-07-27 DIAGNOSIS — C569 Malignant neoplasm of unspecified ovary: Secondary | ICD-10-CM | POA: Insufficient documentation

## 2011-07-27 NOTE — Progress Notes (Signed)
Consult Note: Gyn-Onc   Jamie [name] 56 y.o. female  Chief Complaint  Patient presents with  . Endometrial cancer    Follow-up    Interval History: The patient returns today as previously scheduled. Overall she's doing well although she's had some nausea over the past week with a couple of episodes of vomiting. She denies any abdominal cramping she denies any fever or chills. Overall her functional status is been good. She has some numbness in the suprapubic region. She's resumed having sexual intercourse is had no trouble. She denies any vaginal bleeding.  HPI:The patient underwent a robotic hysterectomy, bilateral salpingo-oophorectomy, and pelvic lymphadenectomy on 03/20/2011 for treatment of an endometrial carcinoma. Final pathology showed a grade 1 endometrial cancer confined to the inner half of the myometrium. In addition an adenocarcinoma was also found in the left ovary. Review of the pathology report indicates that this appears to be 2 synchronous lesions. With regard to the endometrial lesion it appears to be low risk and no additional therapy would be recommended. Further, the ovarian lesion is confined to the ovarian parenchyma and there is no other evidence of metastatic disease (stage IA grade 1) . At first postoperative followup a pelvic hematoma was identified on bimanual exam and confirmed with ultrasound.      Allergies  Allergen Reactions  . Tylox Itching and Swelling    Past Medical History  Diagnosis Date  . GERD (gastroesophageal reflux disease)   . History of cervical dysplasia   . Ulcer     history of gastritis  . Allergy   . Hyperlipidemia   . PONV (postoperative nausea and vomiting)     ONLY AFTER CERVICAL FUSION--NO PROB WITH OTHER SURGERIES  . Diabetes mellitus     ORAL MEDS-NO INSULIN  . Hiatal hernia   . Endometrial cancer 03/2011  . Ovarian cancer 03/2011    Past Surgical History  Procedure Date  . Cervical fusion 09/2003  . Lumbar  fusion 11/1998  . Gynecologic cryosurgery 02/2003  . Achilles tendon surgery     MULTIPLE SURGERIES BECAUSE OF STAPH INFECTION    Current Outpatient Prescriptions  Medication Sig Dispense Refill  . ALPRAZolam (XANAX) 0.5 MG tablet Take 0.5 mg by mouth at bedtime as needed. ANXIETY, out at this time       . Calcium Carbonate-Vitamin D (CALTRATE 600+D) 600-400 MG-UNIT per tablet Take 1 tablet by mouth daily.       . metFORMIN (GLUCOPHAGE) 500 MG tablet Take 1 tablet (500 mg total) by mouth 2 (two) times daily with a meal. Office visit is needed for refills  180 tablet  1  . omeprazole (PRILOSEC) 20 MG capsule Take 1 capsule (20 mg total) by mouth daily.  60 capsule  0  . simvastatin (ZOCOR) 20 MG tablet Take 1 tablet (20 mg total) by mouth at bedtime.  90 tablet  1  . celecoxib (CELEBREX) 200 MG capsule Take 200 mg by mouth daily as needed. PAIN      . Multiple Vitamins-Minerals (MULTIVITAMIN WITH MINERALS) tablet Take 1 tablet by mouth daily. Not taken in past two weeks      . Pseudoeph-Doxylamine-DM-APAP (NYQUIL) 60-7.10-03-998 MG/30ML LIQD Take 30 mLs by mouth at bedtime as needed.       Marland Kitchen terconazole (TERAZOL 7) 0.4 % vaginal cream Place 1 applicator vaginally at bedtime.          History   Social History  . Marital Status: Married    Spouse Name: N/A  Number of Children: 1  . 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 Smoking status: Never Smoker   . Smokeless tobacco: Never Used  . Alcohol Use: 0.5 oz/week    1 drink(s) per week     occassional  . Drug Use: No  . Sexually Active: Not Currently     because of surgery   Other Topics Concern  . Not on file   Social History Narrative   1 step son Regular exercise- (walking)    Family History  Problem Relation Age of Onset  . Ovarian cancer Cousin   . Uterine cancer Cousin   . Breast cancer Maternal Aunt   . Colon cancer Other     Review of Systems: 10  point review of systems is negative except as noted above  Vitals: Blood pressure 122/68, pulse 74, temperature 98 F (36.7 C), temperature source Oral, resp. rate 20, height 5' 1.26" (1.556 m), weight 138 lb 6.4 oz (62.778 kg).  Physical Exam: In general the patient is a wasn't healthy white female no acute distress.  HEENT is negative  Neck is supple without thyromegaly  There is no subclavicular or inguinal adenopathy  The abdomen is soft and nontender no masses again a megaly ascites or hernias are noted. All a laparoscopic scars are healed.  Pelvic exam  EGBUS vagina bladder urethra are normal the vaginal cuff is a small amount of granulation tissue present. This is biopsied away and then treated with silver nitrate.  The cervix and uterus are surgically absent. On bimanual examination there is fullness at the vaginal apex measured approximately 4 x 3 cm which I presume is resolving hematoma.  Adnexa are without masses or nodularity rectovaginal exam confirms.    Assessment/Plan: Stage IA grade 1 endometrial carcinoma  Stage IA grade 1 ovarian cancer  Patient is doing well and is clinically free of disease.  She returned to see me in 3 months for continued followup   Jeannette Corpus, MD 07/27/2011, 2:53 PM                         Consult Note: Gyn-Onc   Jamie [name] 56 y.o. female  Chief Complaint  Patient presents with  . Endometrial cancer    Follow-up    Interval History:   HPI:  Allergies  Allergen Reactions  . Tylox Itching and Swelling    Past Medical History  Diagnosis Date  . GERD (gastroesophageal reflux disease)   . History of cervical dysplasia   . Ulcer     history of gastritis  . Allergy   . Hyperlipidemia   . PONV (postoperative nausea and vomiting)     ONLY AFTER CERVICAL FUSION--NO PROB WITH OTHER SURGERIES  . Diabetes mellitus     ORAL MEDS-NO INSULIN  . Hiatal hernia   . Endometrial cancer  03/2011  . Ovarian cancer 03/2011    Past Surgical History  Procedure Date  . Cervical fusion 09/2003  . Lumbar fusion 11/1998  . Gynecologic cryosurgery 02/2003  . Achilles tendon surgery     MULTIPLE SURGERIES BECAUSE OF STAPH INFECTION    Current Outpatient Prescriptions  Medication Sig Dispense Refill  . ALPRAZolam (XANAX) 0.5 MG tablet Take 0.5 mg by mouth at bedtime as needed. ANXIETY, out at this time       . Calcium Carbonate-Vitamin D (CALTRATE 600+D) 600-400  MG-UNIT per tablet Take 1 tablet by mouth daily.       . metFORMIN (GLUCOPHAGE) 500 MG tablet Take 1 tablet (500 mg total) by mouth 2 (two) times daily with a meal. Office visit is needed for refills  180 tablet  1  . omeprazole (PRILOSEC) 20 MG capsule Take 1 capsule (20 mg total) by mouth daily.  60 capsule  0  . simvastatin (ZOCOR) 20 MG tablet Take 1 tablet (20 mg total) by mouth at bedtime.  90 tablet  1  . celecoxib (CELEBREX) 200 MG capsule Take 200 mg by mouth daily as needed. PAIN      . Multiple Vitamins-Minerals (MULTIVITAMIN WITH MINERALS) tablet Take 1 tablet by mouth daily. Not taken in past two weeks      . Pseudoeph-Doxylamine-DM-APAP (NYQUIL) 60-7.10-03-998 MG/30ML LIQD Take 30 mLs by mouth at bedtime as needed.       Marland Kitchen terconazole (TERAZOL 7) 0.4 % vaginal cream Place 1 applicator vaginally at bedtime.          History   Social History  . Marital Status: Married    Spouse Name: N/A    Number of Children: 1  . 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 Smoking status: Never Smoker   . Smokeless tobacco: Never Used  . Alcohol Use: 0.5 oz/week    1 drink(s) per week     occassional  . Drug Use: No  . Sexually Active: Not Currently     because of surgery   Other Topics Concern  . Not on file   Social History Narrative   1 step son Regular exercise- (walking)    Family History  Problem Relation Age of Onset  . Ovarian cancer  Cousin   . Uterine cancer Cousin   . Breast cancer Maternal Aunt   . Colon cancer Other     Review of Systems:  Vitals: Blood pressure 122/68, pulse 74, temperature 98 F (36.7 C), temperature source Oral, resp. rate 20, height 5' 1.26" (1.556 m), weight 138 lb 6.4 oz (62.778 kg).  Physical Exam:  Assessment/Plan:   Jeannette Corpus, MD 07/27/2011, 2:53 PM

## 2011-07-27 NOTE — Patient Instructions (Signed)
Return in 3 months for continuing followup

## 2011-08-02 ENCOUNTER — Other Ambulatory Visit: Payer: Self-pay | Admitting: *Deleted

## 2011-08-02 MED ORDER — OMEPRAZOLE 20 MG PO CPDR: 20.0000 mg | DELAYED_RELEASE_CAPSULE | Freq: Two times a day (BID) | ORAL | Status: DC

## 2011-08-02 MED ORDER — OMEPRAZOLE 20 MG PO CPDR: 20.0000 mg | DELAYED_RELEASE_CAPSULE | Freq: Every day | ORAL | Status: DC

## 2011-08-21 ENCOUNTER — Encounter: Payer: Self-pay | Admitting: Family Medicine

## 2011-08-21 ENCOUNTER — Ambulatory Visit (INDEPENDENT_AMBULATORY_CARE_PROVIDER_SITE_OTHER): Payer: 59 | Admitting: Family Medicine

## 2011-08-21 VITALS — BP 126/84 | HR 82 | Temp 98.5°F | Wt 144.0 lb

## 2011-08-21 DIAGNOSIS — R600 Localized edema: Secondary | ICD-10-CM

## 2011-08-21 DIAGNOSIS — R609 Edema, unspecified: Secondary | ICD-10-CM

## 2011-08-21 NOTE — Progress Notes (Signed)
  Subjective:    Patient ID: Jamie [name], female    DOB: 1955/12/07, 56 y.o.   MRN: [id number]  HPI Here with 3 weeks of swelling and mild pain in the right leg and foot. This started with the foot, then spread up to the lower leg. Now for the past 2 days she has felt discomfort behind the right knee an into the distal thigh. No redness or warmth. No recent trauma. No recent prolonged sitting. She was treated last fall for endometrial cancer. She has never smoked. No SOB or chest pain.    Review of Systems  Constitutional: Negative.   Respiratory: Negative.   Cardiovascular: Positive for leg swelling. Negative for chest pain and palpitations.       Objective:   Physical Exam  Constitutional: She appears well-developed and well-nourished.  Cardiovascular: Normal rate, regular rhythm, normal heart sounds and intact distal pulses.   Pulmonary/Chest: Effort normal and breath sounds normal.  Musculoskeletal:       Right lower leg and foot show 2+ edema. No erythema or warmth. She is mildly tender in the calf and behind the knee. No cords are felt. Negative Homans           Assessment & Plan:  This is most consistent with a DVT, and she may be hypercoagulable due to the recent cancer. She will take 325 mg aspirin bid for now, and we will get a stat venous doppler.

## 2011-08-22 ENCOUNTER — Encounter (INDEPENDENT_AMBULATORY_CARE_PROVIDER_SITE_OTHER): Payer: 59

## 2011-08-22 DIAGNOSIS — M7989 Other specified soft tissue disorders: Secondary | ICD-10-CM

## 2011-08-22 DIAGNOSIS — R600 Localized edema: Secondary | ICD-10-CM

## 2011-08-22 DIAGNOSIS — M79609 Pain in unspecified limb: Secondary | ICD-10-CM

## 2011-08-22 NOTE — Progress Notes (Signed)
  Subjective:    Patient ID: Jamie [name], female    DOB: 12/22/1955, 56 y.o.   MRN: [id number]  HPI    Review of Systems     Objective:   Physical Exam        Assessment & Plan:  We received a phone call this morning that her doppler was negative for DVT. We still do not have a good explanation for why her leg is swollen however. She should follow up with Dr. Artist Pais this week. Stay on the aspirin for the time being.

## 2011-08-27 ENCOUNTER — Ambulatory Visit (INDEPENDENT_AMBULATORY_CARE_PROVIDER_SITE_OTHER): Payer: 59 | Admitting: Internal Medicine

## 2011-08-27 ENCOUNTER — Encounter: Payer: Self-pay | Admitting: Internal Medicine

## 2011-08-27 VITALS — BP 132/82 | HR 72 | Temp 98.2°F | Wt 146.0 lb

## 2011-08-27 DIAGNOSIS — I890 Lymphedema, not elsewhere classified: Secondary | ICD-10-CM | POA: Insufficient documentation

## 2011-08-27 MED ORDER — CLONAZEPAM 0.5 MG PO TABS: 0.5000 mg | ORAL_TABLET | Freq: Every evening | ORAL | Status: DC | PRN

## 2011-08-27 NOTE — Progress Notes (Signed)
Subjective:    Patient ID: Jamie [name], female    DOB: 03-Jul-1955, 56 y.o.   MRN: [id number]  HPI  56 year old white female with history of endometrial cancer was recently seen by Dr. Clent Ridges for right lower extremity edema. There was concern for possible DVT especially in light of her recent diagnosis of malignancy.  Her Doppler was negative for DVT. During surgical procedure multiple lymph nodes were sampled in her pelvis.  She has mild redness of her foot due to possible chronic tinea dermatitis. She denies fever or chills.   Review of Systems Negative for chest pain or shortness of breath.  Past Medical History  Diagnosis Date  . GERD (gastroesophageal reflux disease)   . History of cervical dysplasia   . Ulcer     history of gastritis  . Allergy   . Hyperlipidemia   . PONV (postoperative nausea and vomiting)     ONLY AFTER CERVICAL FUSION--NO PROB WITH OTHER SURGERIES  . Diabetes mellitus     ORAL MEDS-NO INSULIN  . Hiatal hernia   . Endometrial cancer 03/2011  . Ovarian cancer 03/2011    History   Social History  . Marital Status: Married    Spouse Name: N/A    Number of Children: 1  . 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 Smoking status: Never Smoker   . Smokeless tobacco: Never Used  . Alcohol Use: 0.0 oz/week     once every 2-3 months  . Drug Use: No  . Sexually Active: Not Currently     because of surgery   Other Topics Concern  . Not on file   Social History Narrative   1 step son Regular exercise- (walking)    Past Surgical History  Procedure Date  . Cervical fusion 09/2003  . Lumbar fusion 11/1998  . Gynecologic cryosurgery 02/2003  . Achilles tendon surgery     MULTIPLE SURGERIES BECAUSE OF STAPH INFECTION    Family History  Problem Relation Age of Onset  . Ovarian cancer Cousin   . Uterine cancer Cousin   . Breast cancer Maternal Aunt   . Colon cancer Other      Allergies  Allergen Reactions  . Tylox Itching and Swelling    Current Outpatient Prescriptions on File Prior to Visit  Medication Sig Dispense Refill  . Calcium Carbonate-Vitamin D (CALTRATE 600+D) 600-400 MG-UNIT per tablet Take 1 tablet by mouth daily.       . metFORMIN (GLUCOPHAGE) 500 MG tablet Take 1 tablet (500 mg total) by mouth 2 (two) times daily with a meal. Office visit is needed for refills  180 tablet  1  . Multiple Vitamins-Minerals (MULTIVITAMIN WITH MINERALS) tablet Take 1 tablet by mouth daily. Not taken in past two weeks      . omeprazole (PRILOSEC) 20 MG capsule Take 1 capsule (20 mg total) by mouth 2 (two) times daily.  60 capsule  5  . simvastatin (ZOCOR) 20 MG tablet Take 1 tablet (20 mg total) by mouth at bedtime.  90 tablet  1  . clonazePAM (KLONOPIN) 0.5 MG tablet Take 1 tablet (0.5 mg total) by mouth at bedtime as needed (Sleep).  30 tablet  2    BP 132/82  Pulse 72  Temp(Src) 98.2 F (36.8 C) (Oral)  Wt 146 lb (66.225 kg)       Objective:   Physical Exam  Constitutional: She  appears well-developed and well-nourished. No distress.  Cardiovascular: Normal rate, regular rhythm and normal heart sounds.   Pulmonary/Chest: Effort normal. She has wheezes. She has no rales.  Musculoskeletal:       + 2 pitting edema of right lower ext  Skin: Skin is warm and dry.       No streaking or redness of lower ext. Mild redness of toes and foot is scaly          Assessment & Plan:

## 2011-08-27 NOTE — Assessment & Plan Note (Signed)
Right lower extremity edema may be secondary to lymphedema. Venous Doppler was negative for DVT. Patient advised to elevate her right leg overnight and use compression stockings.

## 2011-08-30 ENCOUNTER — Ambulatory Visit: Payer: 59 | Attending: Gynecology | Admitting: Physical Therapy

## 2011-08-30 DIAGNOSIS — IMO0001 Reserved for inherently not codable concepts without codable children: Secondary | ICD-10-CM | POA: Insufficient documentation

## 2011-08-30 DIAGNOSIS — I890 Lymphedema, not elsewhere classified: Secondary | ICD-10-CM | POA: Insufficient documentation

## 2011-09-10 ENCOUNTER — Ambulatory Visit: Payer: 59 | Attending: Gynecology

## 2011-09-10 DIAGNOSIS — IMO0001 Reserved for inherently not codable concepts without codable children: Secondary | ICD-10-CM | POA: Insufficient documentation

## 2011-09-10 DIAGNOSIS — I890 Lymphedema, not elsewhere classified: Secondary | ICD-10-CM | POA: Insufficient documentation

## 2011-09-12 ENCOUNTER — Ambulatory Visit: Payer: 59

## 2011-09-13 ENCOUNTER — Ambulatory Visit: Payer: 59 | Admitting: Physical Therapy

## 2011-09-17 ENCOUNTER — Ambulatory Visit: Payer: 59

## 2011-09-19 ENCOUNTER — Ambulatory Visit: Payer: 59

## 2011-09-24 ENCOUNTER — Ambulatory Visit: Payer: 59 | Admitting: Physical Therapy

## 2011-09-26 ENCOUNTER — Ambulatory Visit: Payer: 59

## 2011-09-28 ENCOUNTER — Encounter: Payer: 59 | Admitting: Physical Therapy

## 2011-10-05 ENCOUNTER — Encounter: Payer: 59 | Admitting: Physical Therapy

## 2011-11-02 ENCOUNTER — Ambulatory Visit: Payer: 59 | Attending: Gynecology | Admitting: Gynecology

## 2011-11-02 ENCOUNTER — Other Ambulatory Visit (HOSPITAL_COMMUNITY)
Admission: RE | Admit: 2011-11-02 | Discharge: 2011-11-02 | Disposition: A | Discharge status: Home / Self Care | Payer: 59 | Source: Ambulatory Visit | Attending: Gynecology | Admitting: Gynecology

## 2011-11-02 ENCOUNTER — Encounter: Payer: Self-pay | Admitting: Gynecology

## 2011-11-02 VITALS — BP 150/78 | HR 86 | Temp 98.5°F | Resp 20 | Ht 62.0 in | Wt 145.5 lb

## 2011-11-02 DIAGNOSIS — K449 Diaphragmatic hernia without obstruction or gangrene: Secondary | ICD-10-CM | POA: Insufficient documentation

## 2011-11-02 DIAGNOSIS — Z8543 Personal history of malignant neoplasm of ovary: Secondary | ICD-10-CM | POA: Insufficient documentation

## 2011-11-02 DIAGNOSIS — C541 Malignant neoplasm of endometrium: Secondary | ICD-10-CM

## 2011-11-02 DIAGNOSIS — Z8049 Family history of malignant neoplasm of other genital organs: Secondary | ICD-10-CM | POA: Insufficient documentation

## 2011-11-02 DIAGNOSIS — Z800 Family history of malignant neoplasm of digestive organs: Secondary | ICD-10-CM | POA: Insufficient documentation

## 2011-11-02 DIAGNOSIS — Z8542 Personal history of malignant neoplasm of other parts of uterus: Secondary | ICD-10-CM | POA: Insufficient documentation

## 2011-11-02 DIAGNOSIS — Z885 Allergy status to narcotic agent status: Secondary | ICD-10-CM | POA: Insufficient documentation

## 2011-11-02 DIAGNOSIS — Z01419 Encounter for gynecological examination (general) (routine) without abnormal findings: Secondary | ICD-10-CM | POA: Insufficient documentation

## 2011-11-02 DIAGNOSIS — Z7982 Long term (current) use of aspirin: Secondary | ICD-10-CM | POA: Insufficient documentation

## 2011-11-02 DIAGNOSIS — Z124 Encounter for screening for malignant neoplasm of cervix: Secondary | ICD-10-CM | POA: Insufficient documentation

## 2011-11-02 DIAGNOSIS — Z803 Family history of malignant neoplasm of breast: Secondary | ICD-10-CM | POA: Insufficient documentation

## 2011-11-02 DIAGNOSIS — K219 Gastro-esophageal reflux disease without esophagitis: Secondary | ICD-10-CM | POA: Insufficient documentation

## 2011-11-02 DIAGNOSIS — Z90710 Acquired absence of both cervix and uterus: Secondary | ICD-10-CM | POA: Insufficient documentation

## 2011-11-02 DIAGNOSIS — E119 Type 2 diabetes mellitus without complications: Secondary | ICD-10-CM | POA: Insufficient documentation

## 2011-11-02 DIAGNOSIS — I890 Lymphedema, not elsewhere classified: Secondary | ICD-10-CM | POA: Insufficient documentation

## 2011-11-02 DIAGNOSIS — E785 Hyperlipidemia, unspecified: Secondary | ICD-10-CM | POA: Insufficient documentation

## 2011-11-02 DIAGNOSIS — Z8041 Family history of malignant neoplasm of ovary: Secondary | ICD-10-CM | POA: Insufficient documentation

## 2011-11-02 NOTE — Patient Instructions (Signed)
Please see Dr. Tenny Craw in 3 months and returned to see [place] in 6 months. We will contact you with her Pap smear report.  Please initiate consultation with a lymphedema therapy center.

## 2011-11-02 NOTE — Progress Notes (Signed)
Consult Note: Gyn-Onc   Jamie [name] 56 y.o. female  Chief Complaint  Patient presents with  . Endo ca    Follow up    Interval History: The patient returns today for continuing followup as previously scheduled. Since her last visit she's done well although she's been found to have lymphedema of the left lower extremity. She has had massage lymphedema therapy and is wearing a compression stocking that is the length. Recently she's noted she has more edema in the thigh while the lower leg is well controlled.  She denies any pelvic pain pressure vaginal bleeding or discharge. She has no GI or GU symptoms.  HPI:HPI:The patient underwent a robotic hysterectomy, bilateral salpingo-oophorectomy, and pelvic lymphadenectomy on 03/20/2011 for treatment of an endometrial carcinoma. Final pathology showed a grade 1 endometrial cancer confined to the inner half of the myometrium. In addition an adenocarcinoma was also found in the left ovary. Review of the pathology report indicates that this appears to be 2 synchronous lesions. With regard to the endometrial lesion it appears to be low risk and no additional therapy would be recommended. Further, the ovarian lesion is confined to the ovarian parenchyma and there is no other evidence of metastatic disease (stage IA grade 1) . At first postoperative followup a pelvic hematoma was identified on bimanual exam and confirmed with ultrasound. The hematoma ultimately resolved with observation.     Review of Systems:10 point review of systems is negative as noted above.   Vitals: Blood pressure 150/78, pulse 86, temperature 98.5 F (36.9 C), temperature source Oral, resp. rate 20, height 5\' 2"  (1.575 m), weight 145 lb 8 oz (65.998 kg).  Physical Exam: General : The patient is a healthy woman in no acute distress.  HEENT: normocephalic, extraoccular movements normal; neck is supple without thyromegally  Lynphnodes: Supraclavicular and inguinal nodes not  enlarged  Abdomen: Soft, non-tender, no ascites, no organomegally, no masses, no hernias  Pelvic:  EGBUS: Normal female  Vagina: Normal, no lesions  Urethra and Bladder: Normal, non-tender  Cervix: Surgically absent  Uterus: Surgically absent  Bi-manual examination: Non-tender; no adenxal masses or nodularity (hematoma has now resolved) Rectal: normal sphincter tone, no masses, no blood  Lower extremities: 2+ lymphedema of the entire right lower extremity. There is no evidence of cellulitis   Assessment/Plan: Stage IA grade 1 endometrial cancer. Stage IA grade 1 ovarian cancer diagnosed November 2012. Patient's clinically free of disease. Pap smears are obtained.  Lymphedema of the left lower extremity. The patient is given a renew prescription for lymphedema physical therapy.  Patient return to see Dr. Duane Lope in 3 months return to see [place] in 6 months  Allergies  Allergen Reactions  . Oxycodone-Acetaminophen Itching and Swelling    Past Medical History  Diagnosis Date  . GERD (gastroesophageal reflux disease)   . History of cervical dysplasia   . Ulcer     history of gastritis  . Allergy   . Hyperlipidemia   . PONV (postoperative nausea and vomiting)     ONLY AFTER CERVICAL FUSION--NO PROB WITH OTHER SURGERIES  . Diabetes mellitus     ORAL MEDS-NO INSULIN  . Hiatal hernia   . Endometrial cancer 03/2011  . Ovarian cancer 03/2011    Past Surgical History  Procedure Date  . Cervical fusion 09/2003  . Lumbar fusion 11/1998  . Gynecologic cryosurgery 02/2003  . Achilles tendon surgery     MULTIPLE SURGERIES BECAUSE OF STAPH INFECTION    Current Outpatient Prescriptions  Medication Sig Dispense Refill  . aspirin 325 MG tablet Take 325 mg by mouth daily.       . metFORMIN (GLUCOPHAGE) 500 MG tablet Take 1 tablet (500 mg total) by mouth 2 (two) times daily with a meal. Office visit is needed for refills  180 tablet  1  . Multiple Vitamins-Minerals (MULTIVITAMIN WITH  MINERALS) tablet Take 1 tablet by mouth daily. Not taken in past two weeks      . omeprazole (PRILOSEC) 20 MG capsule Take 1 capsule (20 mg total) by mouth 2 (two) times daily.  60 capsule  5  . simvastatin (ZOCOR) 20 MG tablet Take 1 tablet (20 mg total) by mouth at bedtime.  90 tablet  1  . Calcium Carbonate-Vitamin D (CALTRATE 600+D) 600-400 MG-UNIT per tablet Take 1 tablet by mouth daily.       . clonazePAM (KLONOPIN) 0.5 MG tablet Take 1 tablet (0.5 mg total) by mouth at bedtime as needed (Sleep).  30 tablet  2    History   Social History  . Marital Status: Married    Spouse Name: N/A    Number of Children: 1  . 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 Smoking status: Never Smoker   . Smokeless tobacco: Never Used  . Alcohol Use: 0.0 oz/week     once every 2-3 months  . Drug Use: No  . Sexually Active: Not Currently     because of surgery   Other Topics Concern  . Not on file   Social History Narrative   1 step son Regular exercise- (walking)    Family History  Problem Relation Age of Onset  . Ovarian cancer Cousin   . Uterine cancer Cousin   . Breast cancer Maternal Aunt   . Colon cancer Other       CLARKE-PEARSON,Tajae Rybicki L, MD 11/02/2011, 4:49 PM

## 2011-11-05 ENCOUNTER — Telehealth: Payer: Self-pay | Admitting: Gynecologic Oncology

## 2011-11-05 NOTE — Telephone Encounter (Signed)
Pt informed that [facility] received the order for evaluation and treatment of right upper leg lymphedema and that they stated they will be contacting her to arrange an appointment.  Pt reporting that she developed a sore throat and sinus drainage on Friday June 28.  Denies fever and difficulty swallowing.  Stating that the sore throat is improving and that the main symptom is stuffiness and nasal drainage.  Symptom management discussed and instructed to call for worsening symptoms.

## 2011-11-12 ENCOUNTER — Telehealth: Payer: Self-pay | Admitting: Gynecologic Oncology

## 2011-11-12 NOTE — Telephone Encounter (Signed)
Pt notified about pap results: negative.  No questions or concerns voiced. Pt stating that she experienced flu-like symptoms last Thursday and Friday.  Reporting fever on those two days.  Reporting that she is feels her symptoms are "better than they were last week."  Reportable signs and symptoms reviewed.  Instructed to call for any questions or concerns.

## 2011-11-22 ENCOUNTER — Telehealth: Payer: Self-pay | Admitting: Gynecologic Oncology

## 2011-11-22 NOTE — Telephone Encounter (Signed)
Spoke with patient about upcoming trip.  Recommended that patient wear TED hose and to move around the plane when able.  Complaining of ear pain for the past month.  Stating that she has been running a low grade fever.  Recommended to see her primary physician prior to leaving for her trip to evaluate ear pain and low grade fever.  Pt verbalizing  Understanding.  Instructed to call for any questions or concerns.

## 2011-12-03 ENCOUNTER — Ambulatory Visit: Payer: 59 | Admitting: Physical Therapy

## 2011-12-17 ENCOUNTER — Ambulatory Visit: Payer: 59 | Attending: Gynecology | Admitting: Physical Therapy

## 2011-12-17 DIAGNOSIS — IMO0001 Reserved for inherently not codable concepts without codable children: Secondary | ICD-10-CM | POA: Insufficient documentation

## 2011-12-17 DIAGNOSIS — I890 Lymphedema, not elsewhere classified: Secondary | ICD-10-CM | POA: Insufficient documentation

## 2011-12-31 ENCOUNTER — Ambulatory Visit: Payer: 59

## 2012-01-02 ENCOUNTER — Ambulatory Visit: Payer: 59 | Admitting: Physical Therapy

## 2012-01-04 ENCOUNTER — Encounter: Payer: 59 | Admitting: Physical Therapy

## 2012-01-09 ENCOUNTER — Ambulatory Visit: Payer: 59 | Attending: Gynecology

## 2012-01-09 DIAGNOSIS — IMO0001 Reserved for inherently not codable concepts without codable children: Secondary | ICD-10-CM | POA: Insufficient documentation

## 2012-01-09 DIAGNOSIS — I890 Lymphedema, not elsewhere classified: Secondary | ICD-10-CM | POA: Insufficient documentation

## 2012-01-11 ENCOUNTER — Encounter: Payer: 59 | Admitting: Physical Therapy

## 2012-01-16 ENCOUNTER — Encounter: Payer: 59 | Admitting: Physical Therapy

## 2012-01-18 ENCOUNTER — Other Ambulatory Visit: Payer: Self-pay | Admitting: *Deleted

## 2012-01-18 ENCOUNTER — Encounter: Payer: 59 | Admitting: Physical Therapy

## 2012-01-18 IMAGING — US US PELVIS COMPLETE
1 series · 13 of 25 positions shown · non-contrast
Comparison: None.

CLINICAL DATA: 6 weeks status post complete hysterectomy with
palpable mass on physical exam.  History of endometrial carcinoma.

TRANSABDOMINAL AND TRANSVAGINAL ULTRASOUND OF PELVIS
TECHNIQUE: Both transabdominal and transvaginal ultrasound
examinations of the pelvis were performed. Transabdominal technique
was performed for global imaging of the pelvis including uterus,
ovaries, adnexal regions, and pelvic cul-de-sac.

[Series 1: us pelvis complete · 0.28mm/px · 13 of 46 slices shown]
[im 1/46]
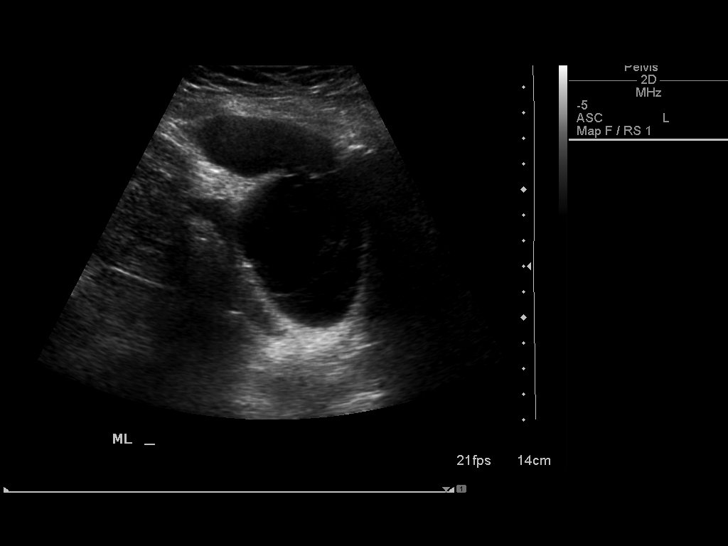
[im 4/46]
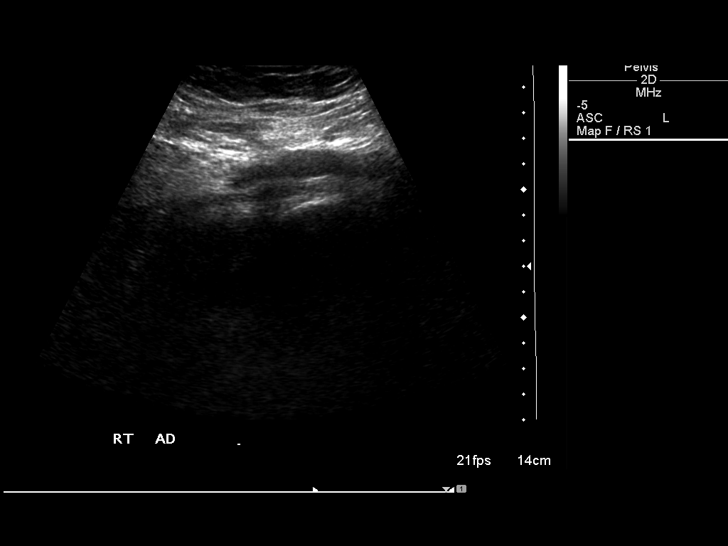
[im 8/46]
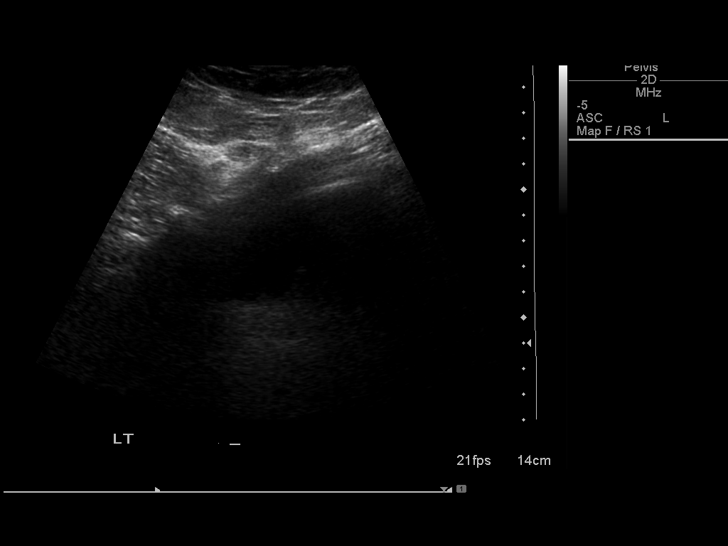
[im 12/46]
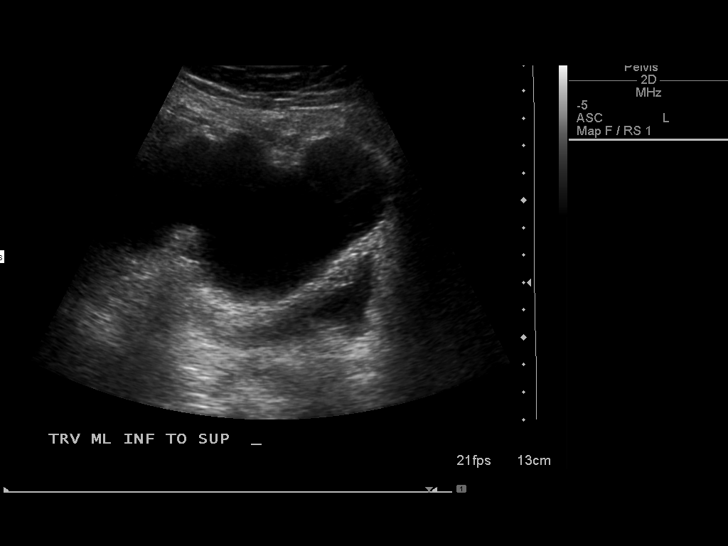
[im 16/46]
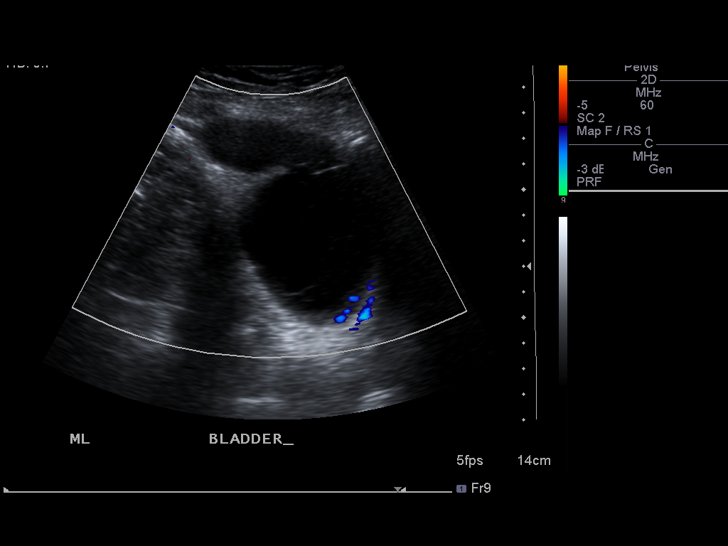
[im 19/46]
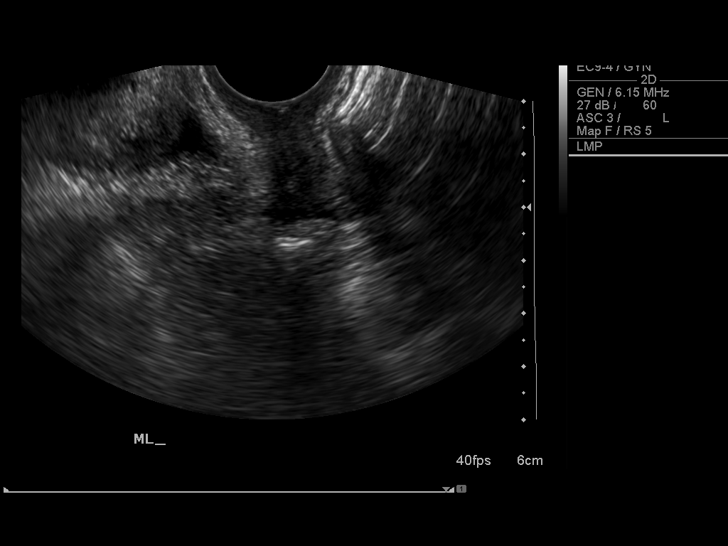
[im 23/46]
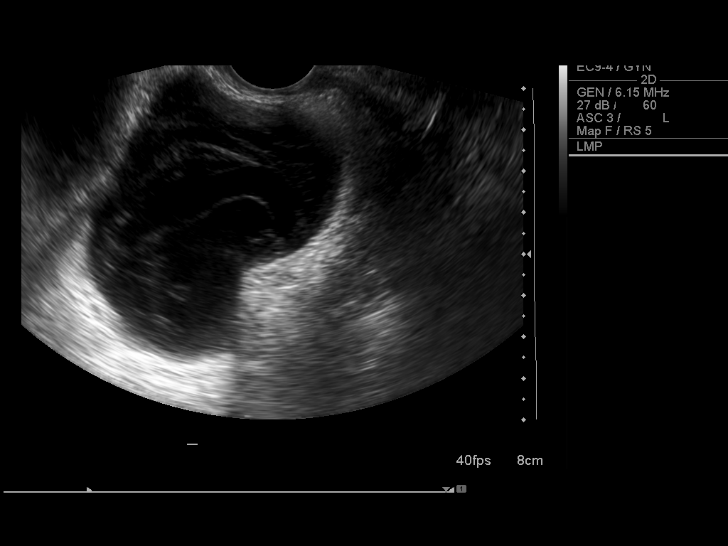
[im 27/46]
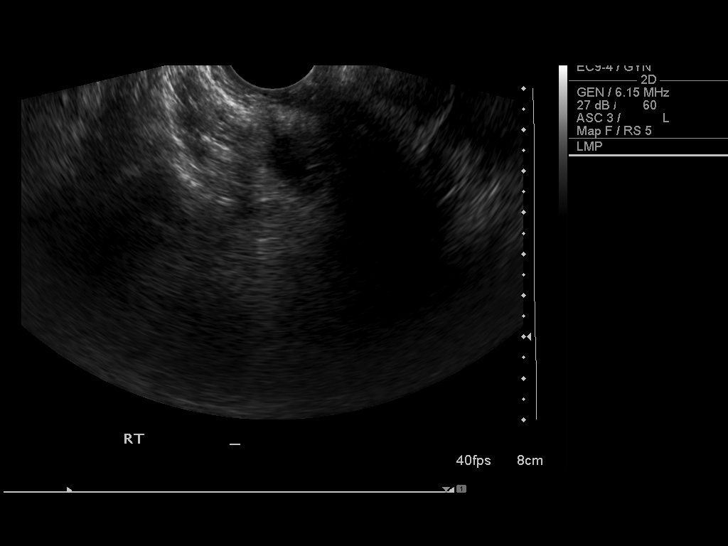
[im 31/46]
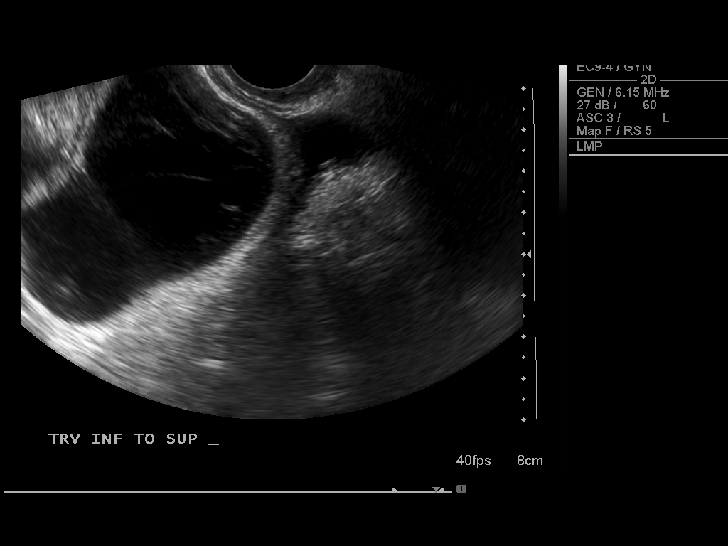
[im 34/46]
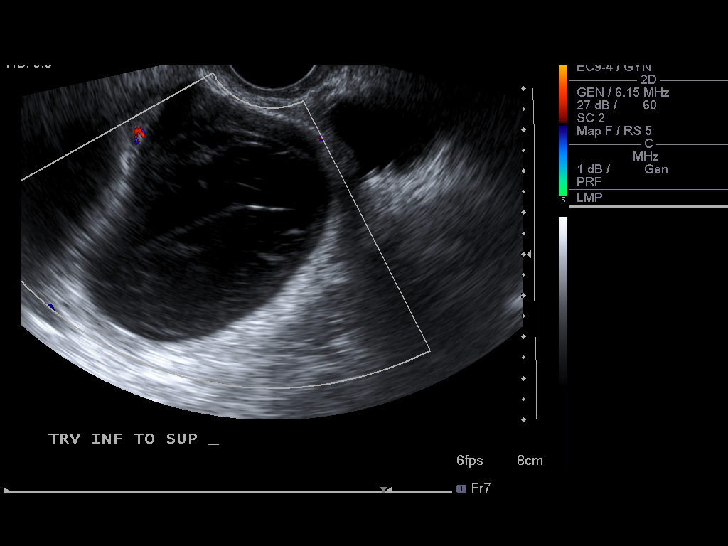
[im 38/46]
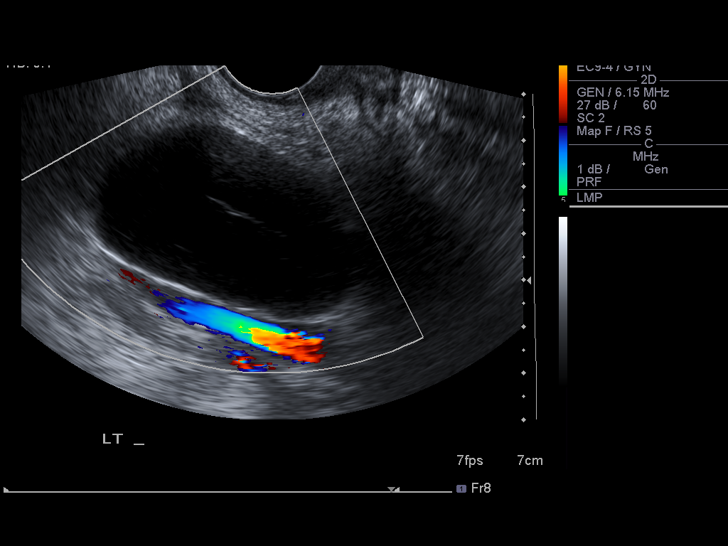
[im 42/46]
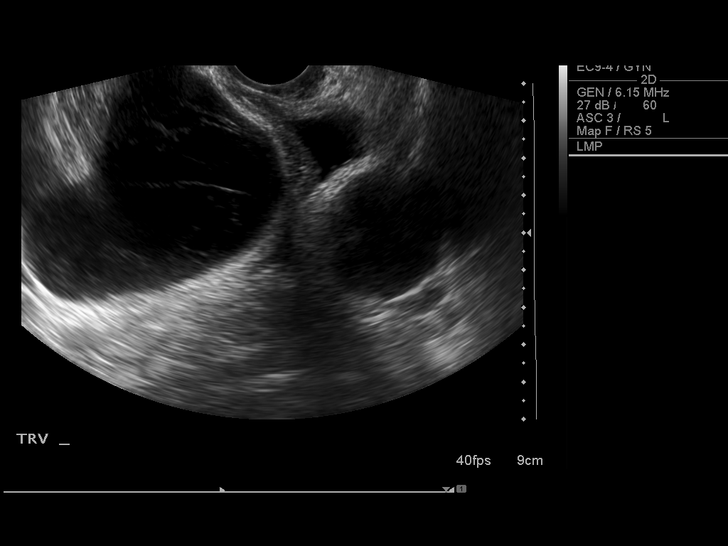
[im 46/46]
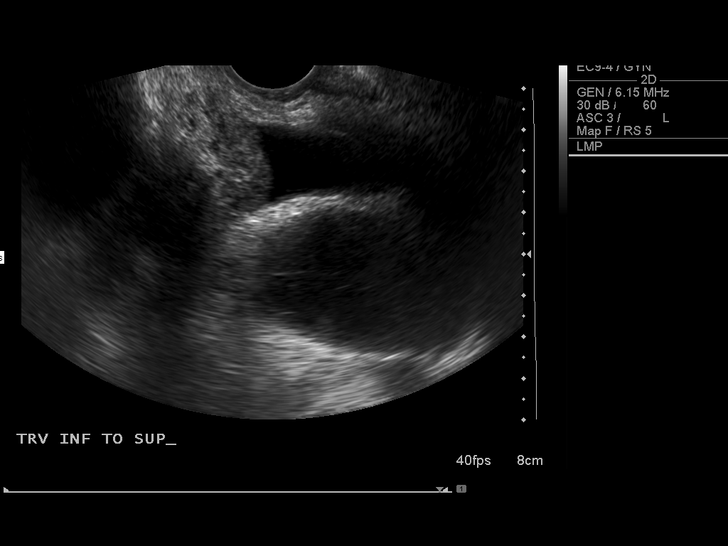

[13 of 25 positions shown; findings below may reference images not displayed]

It was necessary to proceed with endovaginal exam following the
transabdominal exam to visualize the .
FINDINGS: Uterus: Has been surgically removed.  A normal vaginal cuff is
identified.

Endometrium: Not applicable

Right ovary:  Has been surgically removed

Left ovary: Has been surgically removed

Other findings: There is a lobulated complex collection identified
superior to the vaginal cuff which measures 8.1 x 7.3 x 6.3 cm and
contains some wispy internal linear strands with a small amount of
avascular soft tissue.  The overall appearance is compatible with
an old postoperative hematoma. A small amount of simple free fluid
is identified as well.
IMPRESSION: Complex avascular collection in the pelvis has sonographic features
most compatible with a postoperative hematoma given the patient's
recent hysterectomy 6 weeks prior to this exam..

## 2012-01-18 MED ORDER — SIMVASTATIN 20 MG PO TABS: 20.0000 mg | ORAL_TABLET | Freq: Every day | ORAL | Status: DC

## 2012-01-23 ENCOUNTER — Encounter: Payer: 59 | Admitting: Physical Therapy

## 2012-01-25 ENCOUNTER — Encounter: Payer: 59 | Admitting: Physical Therapy

## 2012-02-21 ENCOUNTER — Other Ambulatory Visit: Payer: Self-pay | Admitting: *Deleted

## 2012-02-21 MED ORDER — OMEPRAZOLE 20 MG PO CPDR: 20.0000 mg | DELAYED_RELEASE_CAPSULE | Freq: Two times a day (BID) | ORAL | Status: DC

## 2012-03-27 ENCOUNTER — Other Ambulatory Visit: Payer: Self-pay | Admitting: Family Medicine

## 2012-03-27 DIAGNOSIS — R1032 Left lower quadrant pain: Secondary | ICD-10-CM

## 2012-03-31 ENCOUNTER — Ambulatory Visit
Admission: RE | Admit: 2012-03-31 | Discharge: 2012-03-31 | Disposition: A | Discharge status: Home / Self Care | Payer: 59 | Source: Ambulatory Visit | Attending: Family Medicine | Admitting: Family Medicine

## 2012-03-31 DIAGNOSIS — R1032 Left lower quadrant pain: Secondary | ICD-10-CM

## 2012-03-31 MED ORDER — IOHEXOL 300 MG/ML  SOLN
100.0000 mL | Freq: Once | INTRAMUSCULAR | Status: AC | PRN
  Administered 2012-03-31: 100 mL via INTRAVENOUS

## 2012-04-18 ENCOUNTER — Ambulatory Visit: Payer: 59 | Attending: Gynecology | Admitting: Gynecology

## 2012-04-18 ENCOUNTER — Other Ambulatory Visit (HOSPITAL_COMMUNITY)
Admission: RE | Admit: 2012-04-18 | Discharge: 2012-04-18 | Disposition: A | Discharge status: Home / Self Care | Payer: 59 | Source: Ambulatory Visit | Attending: Gynecology | Admitting: Gynecology

## 2012-04-18 ENCOUNTER — Encounter: Payer: Self-pay | Admitting: Gynecology

## 2012-04-18 VITALS — BP 122/70 | HR 80 | Temp 98.7°F | Resp 18 | Ht 62.0 in | Wt 144.0 lb

## 2012-04-18 DIAGNOSIS — E785 Hyperlipidemia, unspecified: Secondary | ICD-10-CM | POA: Insufficient documentation

## 2012-04-18 DIAGNOSIS — Z09 Encounter for follow-up examination after completed treatment for conditions other than malignant neoplasm: Secondary | ICD-10-CM | POA: Insufficient documentation

## 2012-04-18 DIAGNOSIS — Z79899 Other long term (current) drug therapy: Secondary | ICD-10-CM | POA: Insufficient documentation

## 2012-04-18 DIAGNOSIS — Z9079 Acquired absence of other genital organ(s): Secondary | ICD-10-CM | POA: Insufficient documentation

## 2012-04-18 DIAGNOSIS — Z7982 Long term (current) use of aspirin: Secondary | ICD-10-CM | POA: Insufficient documentation

## 2012-04-18 DIAGNOSIS — C549 Malignant neoplasm of corpus uteri, unspecified: Secondary | ICD-10-CM | POA: Insufficient documentation

## 2012-04-18 DIAGNOSIS — C541 Malignant neoplasm of endometrium: Secondary | ICD-10-CM

## 2012-04-18 DIAGNOSIS — Z803 Family history of malignant neoplasm of breast: Secondary | ICD-10-CM | POA: Insufficient documentation

## 2012-04-18 DIAGNOSIS — Z8041 Family history of malignant neoplasm of ovary: Secondary | ICD-10-CM | POA: Insufficient documentation

## 2012-04-18 DIAGNOSIS — R197 Diarrhea, unspecified: Secondary | ICD-10-CM | POA: Insufficient documentation

## 2012-04-18 DIAGNOSIS — Z8049 Family history of malignant neoplasm of other genital organs: Secondary | ICD-10-CM | POA: Insufficient documentation

## 2012-04-18 DIAGNOSIS — E119 Type 2 diabetes mellitus without complications: Secondary | ICD-10-CM | POA: Insufficient documentation

## 2012-04-18 DIAGNOSIS — Z90710 Acquired absence of both cervix and uterus: Secondary | ICD-10-CM | POA: Insufficient documentation

## 2012-04-18 DIAGNOSIS — K219 Gastro-esophageal reflux disease without esophagitis: Secondary | ICD-10-CM | POA: Insufficient documentation

## 2012-04-18 DIAGNOSIS — I890 Lymphedema, not elsewhere classified: Secondary | ICD-10-CM | POA: Insufficient documentation

## 2012-04-18 DIAGNOSIS — R1032 Left lower quadrant pain: Secondary | ICD-10-CM | POA: Insufficient documentation

## 2012-04-18 DIAGNOSIS — Z01419 Encounter for gynecological examination (general) (routine) without abnormal findings: Secondary | ICD-10-CM | POA: Insufficient documentation

## 2012-04-18 DIAGNOSIS — C569 Malignant neoplasm of unspecified ovary: Secondary | ICD-10-CM | POA: Insufficient documentation

## 2012-04-18 NOTE — Progress Notes (Signed)
Consult Note: Gyn-Onc   Jamie [name] 56 y.o. female  Chief Complaint  Patient presents with  . f/u endometrial cancer    Interval History: The patient returns today for a routine visit. Since her last visit she's developed some left lower quadrant pain around Thanksgiving. She saw Dr. Tenny Craw who obtained an abdominal and pelvic CT scan. CT scan showed no explanation for left lower quadrant pain. There is some suspicion of a gallstone. The patient reports she feels bloated. Dr. Tenny Craw obtained a Pap smear that showed low-grade SIL. Remainder of her laboratory work including thyroid functions were normal.   She has diarrhea denies any constipation or any GU symptoms. The left lower quadrant pain seems to be dull and present most of the time. She denies any other health problems. Lymphedema is much better with compression stockings.  HPI:The patient underwent a robotic hysterectomy, bilateral salpingo-oophorectomy, and pelvic lymphadenectomy on 03/20/2011 for treatment of an endometrial carcinoma. Final pathology showed a grade 1 endometrial cancer confined to the inner half of the myometrium. In addition an adenocarcinoma was also found in the left ovary. Review of the pathology report indicates that this appears to be 2 synchronous lesions. With regard to the endometrial lesion it appears to be low risk and no additional therapy was recommended. Further, the ovarian lesion was confined to the ovarian parenchyma and there is no other evidence of metastatic disease (stage IA grade 1) . At first postoperative followup a pelvic hematoma was identified on bimanual exam and confirmed with ultrasound. The hematoma ultimately resolved with observation.She also developed lymphedema which was managed with PT and compression stockings.   Review of Systems:10 point review of systems is negative as noted above.   Vitals: Blood pressure 122/70, pulse 80, temperature 98.7 F (37.1 C), resp. rate 18, height 5'  2" (1.575 m), weight 144 lb (65.318 kg).  Physical Exam: General : The patient is a healthy woman in no acute distress.  HEENT: normocephalic, extraoccular movements normal; neck is supple without thyromegally  Lynphnodes: Supraclavicular and inguinal nodes not enlarged  Abdomen: Soft, non-tender, no ascites, no organomegally, no masses, no hernias  Pelvic:  EGBUS: Normal female  Vagina: Normal, no lesions  Urethra and Bladder: Normal, non-tender  Cervix: Surgically absent  Uterus: Surgically absent  Bi-manual examination: Non-tender; no adenxal masses or nodularity  Rectal: normal sphincter tone, no masses, no blood  Lower extremities: No edema or varicosities. Normal range of motion    Assessment/Plan: Stage IA grade 1 endometrial adenocarcinoma November 2012. NED. A Pap smears obtained  Stage IA ovarian cancer November 2012, NED  Lymphedema improved  LLQ pain of unknown etiology.  CT is negative for any pathology.  This may be related to her diarrhea.  Will continue to observe, but if she has worsening symptoms we should evaluate further. Return in 6 months  Allergies  Allergen Reactions  . Oxycodone-Acetaminophen Itching and Swelling    Past Medical History  Diagnosis Date  . GERD (gastroesophageal reflux disease)   . History of cervical dysplasia   . Ulcer     history of gastritis  . Allergy   . Hyperlipidemia   . PONV (postoperative nausea and vomiting)     ONLY AFTER CERVICAL FUSION--NO PROB WITH OTHER SURGERIES  . Diabetes mellitus     ORAL MEDS-NO INSULIN  . Hiatal hernia   . Endometrial cancer 03/2011  . Ovarian cancer 03/2011    Past Surgical History  Procedure Date  . Cervical fusion 09/2003  .  Lumbar fusion 11/1998  . Gynecologic cryosurgery 02/2003  . Achilles tendon surgery     MULTIPLE SURGERIES BECAUSE OF STAPH INFECTION    Current Outpatient Prescriptions  Medication Sig Dispense Refill  . aspirin 325 MG tablet Take 325 mg by mouth daily.        . metFORMIN (GLUCOPHAGE) 500 MG tablet Take 1 tablet (500 mg total) by mouth 2 (two) times daily with a meal. Office visit is needed for refills  180 tablet  1  . Multiple Vitamins-Minerals (MULTIVITAMIN WITH MINERALS) tablet Take 1 tablet by mouth daily. Not taken in past two weeks      . omeprazole (PRILOSEC) 20 MG capsule Take 1 capsule (20 mg total) by mouth 2 (two) times daily.  60 capsule  5  . simvastatin (ZOCOR) 20 MG tablet Take 1 tablet (20 mg total) by mouth at bedtime.  90 tablet  1  . Calcium Carbonate-Vitamin D (CALTRATE 600+D) 600-400 MG-UNIT per tablet Take 1 tablet by mouth daily.       . clonazePAM (KLONOPIN) 0.5 MG tablet Take 1 tablet (0.5 mg total) by mouth at bedtime as needed (Sleep).  30 tablet  2    History   Social History  . Marital Status: Married    Spouse Name: N/A    Number of Children: 1  . 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 Smoking status: Never Smoker   . Smokeless tobacco: Never Used  . Alcohol Use: 0.0 oz/week     Comment: once every 2-3 months  . Drug Use: No  . Sexually Active: Not Currently     Comment: because of surgery   Other Topics Concern  . Not on file   Social History Narrative   1 step son Regular exercise- (walking)    Family History  Problem Relation Age of Onset  . Ovarian cancer Cousin   . Uterine cancer Cousin   . Breast cancer Maternal Aunt   . Colon cancer Other       CLARKE-PEARSON,Raja Liska L, MD 04/18/2012, 8:38 AM

## 2012-04-18 NOTE — Patient Instructions (Addendum)
We will contact you if her Pap smear report. Return appointment for 6 months.

## 2012-04-25 ENCOUNTER — Telehealth: Payer: Self-pay | Admitting: Gynecologic Oncology

## 2012-04-25 NOTE — Telephone Encounter (Signed)
Message left about pap results: negative.  Instructed to call for any questions or concerns.

## 2012-05-19 ENCOUNTER — Other Ambulatory Visit: Payer: Self-pay | Admitting: *Deleted

## 2012-05-19 MED ORDER — METFORMIN HCL 500 MG PO TABS: 500.0000 mg | ORAL_TABLET | Freq: Two times a day (BID) | ORAL | Status: DC

## 2012-05-22 ENCOUNTER — Ambulatory Visit (INDEPENDENT_AMBULATORY_CARE_PROVIDER_SITE_OTHER): Payer: 59 | Admitting: Internal Medicine

## 2012-05-22 ENCOUNTER — Encounter: Payer: Self-pay | Admitting: Internal Medicine

## 2012-05-22 VITALS — BP 146/88 | Temp 99.0°F | Wt 144.0 lb

## 2012-05-22 DIAGNOSIS — R109 Unspecified abdominal pain: Secondary | ICD-10-CM

## 2012-05-22 DIAGNOSIS — R141 Gas pain: Secondary | ICD-10-CM

## 2012-05-22 DIAGNOSIS — R110 Nausea: Secondary | ICD-10-CM

## 2012-05-22 DIAGNOSIS — R140 Abdominal distension (gaseous): Secondary | ICD-10-CM

## 2012-05-22 DIAGNOSIS — E119 Type 2 diabetes mellitus without complications: Secondary | ICD-10-CM

## 2012-05-22 DIAGNOSIS — R143 Flatulence: Secondary | ICD-10-CM

## 2012-05-22 DIAGNOSIS — C569 Malignant neoplasm of unspecified ovary: Secondary | ICD-10-CM

## 2012-05-22 LAB — CBC WITH DIFFERENTIAL/PLATELET
Basophils Absolute: 0 10*3/uL (ref 0.0–0.1)
Basophils Relative: 0.4 % (ref 0.0–3.0)
Eosinophils Absolute: 0.4 10*3/uL (ref 0.0–0.7)
Eosinophils Relative: 5.6 % — ABNORMAL HIGH (ref 0.0–5.0)
HCT: 41.3 % (ref 36.0–46.0)
Hemoglobin: 14.1 g/dL (ref 12.0–15.0)
Lymphocytes Relative: 26.9 % (ref 12.0–46.0)
Lymphs Abs: 1.8 10*3/uL (ref 0.7–4.0)
MCHC: 34.2 g/dL (ref 30.0–36.0)
MCV: 86.6 fl (ref 78.0–100.0)
Monocytes Absolute: 0.5 10*3/uL (ref 0.1–1.0)
Monocytes Relative: 6.7 % (ref 3.0–12.0)
Neutro Abs: 4.1 10*3/uL (ref 1.4–7.7)
Neutrophils Relative %: 60.4 % (ref 43.0–77.0)
Platelets: 263 10*3/uL (ref 150.0–400.0)
RBC: 4.77 Mil/uL (ref 3.87–5.11)
RDW: 13.7 % (ref 11.5–14.6)
WBC: 6.8 10*3/uL (ref 4.5–10.5)

## 2012-05-22 LAB — LIPASE: Lipase: 24 U/L (ref 11.0–59.0)

## 2012-05-22 LAB — BASIC METABOLIC PANEL
BUN: 13 mg/dL (ref 6–23)
CO2: 27 mEq/L (ref 19–32)
Calcium: 9.5 mg/dL (ref 8.4–10.5)
Chloride: 103 mEq/L (ref 96–112)
Creatinine, Ser: 0.9 mg/dL (ref 0.4–1.2)
GFR: 69.43 mL/min (ref >60.00)
Glucose, Bld: 101 mg/dL — ABNORMAL HIGH (ref 70–99)
Potassium: 4.2 mEq/L (ref 3.5–5.1)
Sodium: 138 mEq/L (ref 135–145)

## 2012-05-22 LAB — HEPATIC FUNCTION PANEL
ALT: 35 U/L (ref 0–35)
AST: 23 U/L (ref 0–37)
Albumin: 4.4 g/dL (ref 3.5–5.2)
Alkaline Phosphatase: 84 U/L (ref 39–117)
Bilirubin, Direct: 0 mg/dL (ref 0.0–0.3)
Total Bilirubin: 0.6 mg/dL (ref 0.3–1.2)
Total Protein: 7 g/dL (ref 6.0–8.3)

## 2012-05-22 NOTE — Assessment & Plan Note (Signed)
Unclear whether her abdominal complaints related to use of metformin. Patient advised to reduce dose temporarily to see if it alleviates her abdominal complaints.

## 2012-05-22 NOTE — Progress Notes (Signed)
Subjective:    Patient ID: Jamie [name], female    DOB: Jun 25, 1955, 57 y.o.   MRN: [id number]  HPI  57 year old white female with history of endometrial and ovarian cancer complains of intermittent abdominal bloating, nausea and loose stools. Symptoms started approximately 2 weeks ago. Patient had similar symptoms last year and CT of abdomen and pelvis was ordered. There was question of gallbladder cysts and shadowing to suggest gallstones. Patient reports her symptoms are not always related to food. She has adjusted her diet and decreased her intake of fatty foods. She is also discontinued intake of soft drinks. It has not made significant difference in his symptoms.  She describes fullness in her epigastric area. Again she has nausea but no vomiting. She had loose stools for 2 weeks but diarrhea resolved on its own.  Review of Systems Negative for fever chills,  Negative for dysphasia  Past Medical History  Diagnosis Date  . GERD (gastroesophageal reflux disease)   . History of cervical dysplasia   . Ulcer     history of gastritis  . Allergy   . Hyperlipidemia   . PONV (postoperative nausea and vomiting)     ONLY AFTER CERVICAL FUSION--NO PROB WITH OTHER SURGERIES  . Diabetes mellitus     ORAL MEDS-NO INSULIN  . Hiatal hernia   . Endometrial cancer 03/2011  . Ovarian cancer 03/2011    History   Social History  . Marital Status: Married    Spouse Name: N/A    Number of Children: 1  . 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 Smoking status: Never Smoker   . Smokeless tobacco: Never Used  . Alcohol Use: 0.0 oz/week     Comment: once every 2-3 months  . Drug Use: No  . Sexually Active: Not Currently     Comment: because of surgery   Other Topics Concern  . Not on file   Social History Narrative   1 step son Regular exercise- (walking)    Past Surgical History  Procedure Date  . Cervical fusion  09/2003  . Lumbar fusion 11/1998  . Gynecologic cryosurgery 02/2003  . Achilles tendon surgery     MULTIPLE SURGERIES BECAUSE OF STAPH INFECTION    Family History  Problem Relation Age of Onset  . Ovarian cancer Cousin   . Uterine cancer Cousin   . Breast cancer Maternal Aunt   . Colon cancer Other     Allergies  Allergen Reactions  . Oxycodone-Acetaminophen Itching and Swelling    Current Outpatient Prescriptions on File Prior to Visit  Medication Sig Dispense Refill  . aspirin 325 MG tablet Take 325 mg by mouth daily.       . Calcium Carbonate-Vitamin D (CALTRATE 600+D) 600-400 MG-UNIT per tablet Take 1 tablet by mouth daily.       . clonazePAM (KLONOPIN) 0.5 MG tablet Take 0.5 mg by mouth at bedtime as needed.      . metFORMIN (GLUCOPHAGE) 500 MG tablet Take 1 tablet (500 mg total) by mouth 2 (two) times daily with a meal. Office visit is needed for refills  180 tablet  0  . Multiple Vitamins-Minerals (MULTIVITAMIN WITH MINERALS) tablet Take 1 tablet by mouth daily. Not taken in past two weeks      . omeprazole (PRILOSEC) 20 MG capsule Take 1 capsule (20 mg total) by mouth 2 (two) times daily.  60 capsule  5  . simvastatin (ZOCOR) 20 MG tablet Take 1 tablet (20 mg total) by mouth at bedtime.  90 tablet  1    BP 146/88  Temp 99 F (37.2 C) (Oral)  Wt 144 lb (65.318 kg)       Objective:   Physical Exam  Constitutional: She is oriented to person, place, and time. She appears well-developed and well-nourished.  HENT:  Head: Normocephalic and atraumatic.  Mouth/Throat: Oropharynx is clear and moist.  Cardiovascular: Normal rate, regular rhythm and normal heart sounds.   Pulmonary/Chest: Effort normal and breath sounds normal. She has no wheezes.  Abdominal: Soft. Bowel sounds are normal. She exhibits no mass.       Mild epigastric tenderness  Musculoskeletal: She exhibits no edema.  Neurological: She is alert and oriented to person, place, and time. No cranial nerve  deficit.  Skin:       1 cm subcutaneous nodule near right upper quadrant          Assessment & Plan:

## 2012-05-22 NOTE — Assessment & Plan Note (Addendum)
57 year old white female with history of uterine and ovarian cancer complains of abdominal bloating, nausea and epigastric tenderness. CT of abdomen and pelvis completed November suggested possible gallstones. Check CBCD, LFTs and abdominal ultrasound. Rule out gallbladder disease.  Patient has small 1 cm subcutaneous nodule near right upper quadrant. I suspect this area secondary to lipoma.  Trial of over-the-counter probiotics for now. If persistent symptoms consider repeating CT of abdomen and pelvis. Check CA 125 level.

## 2012-05-23 LAB — CA 125: CA 125: 4.2 U/mL (ref 0.0–30.2)

## 2012-06-04 ENCOUNTER — Other Ambulatory Visit: Payer: 59

## 2012-06-16 ENCOUNTER — Other Ambulatory Visit: Payer: 59

## 2012-06-17 ENCOUNTER — Ambulatory Visit
Admission: RE | Admit: 2012-06-17 | Discharge: 2012-06-17 | Disposition: A | Discharge status: Home / Self Care | Payer: 59 | Source: Ambulatory Visit | Attending: Internal Medicine | Admitting: Internal Medicine

## 2012-06-17 DIAGNOSIS — R110 Nausea: Secondary | ICD-10-CM

## 2012-06-17 DIAGNOSIS — R140 Abdominal distension (gaseous): Secondary | ICD-10-CM

## 2012-06-20 ENCOUNTER — Ambulatory Visit: Payer: 59 | Admitting: Internal Medicine

## 2012-07-01 ENCOUNTER — Encounter: Payer: Self-pay | Admitting: Internal Medicine

## 2012-07-01 ENCOUNTER — Ambulatory Visit (INDEPENDENT_AMBULATORY_CARE_PROVIDER_SITE_OTHER): Payer: 59 | Admitting: Internal Medicine

## 2012-07-01 VITALS — BP 132/84 | Temp 98.6°F | Wt 146.0 lb

## 2012-07-01 DIAGNOSIS — R110 Nausea: Secondary | ICD-10-CM

## 2012-07-01 DIAGNOSIS — R109 Unspecified abdominal pain: Secondary | ICD-10-CM

## 2012-07-01 NOTE — Assessment & Plan Note (Addendum)
57 year old white female still having intermittent severe symptoms of indigestion and nausea. Abdominal ultrasound showed 2 small gallbladder polyps. She still may have gallbladder disease. Rule out biliary dyskinesia. Obtain HIDA scan.

## 2012-07-01 NOTE — Progress Notes (Signed)
Subjective:    Patient ID: Jamie [name], female    DOB: [birth year], 57 y.o.   MRN: [id number]  HPI  57 year old white female with history of endometrial and ovarian cancer for followup regarding abdominal bloating and nausea. Patient underwent abdominal ultrasound. Ultrasound was notable for 2 small gallbladder polyps measuring 6 mm and 4 mm respectively. It was negative for gallstones.  It also showed probable fatty infiltration of the liver.  Patient still experiencing intermittent abdominal symptoms. Her symptoms are sometimes worse at night. She describes severe sensitive indigestion.  Patient tried decreasing metformin dose. It had no effect on her symptoms.  No improvement with trial of over-the-counter probiotics.  Review of Systems Negative for fever or chills,  Negative for vomiting  Past Medical History  Diagnosis Date  . GERD (gastroesophageal reflux disease)   . History of cervical dysplasia   . Ulcer     history of gastritis  . Allergy   . Hyperlipidemia   . PONV (postoperative nausea and vomiting)     ONLY AFTER CERVICAL FUSION--NO PROB WITH OTHER SURGERIES  . Diabetes mellitus     ORAL MEDS-NO INSULIN  . Hiatal hernia   . Endometrial cancer 03/2011  . Ovarian cancer 03/2011    History   Social History  . Marital Status: Married    Spouse Name: N/A    Number of Children: 1  . 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 Smoking status: Never Smoker   . Smokeless tobacco: Never Used  . Alcohol Use: 0.0 oz/week     Comment: once every 2-3 months  . Drug Use: No  . Sexually Active: Not Currently     Comment: because of surgery   Other Topics Concern  . Not on file   Social History Narrative   1 step son       Regular exercise- (walking)    Past Surgical History  Procedure Laterality Date  . Cervical fusion  09/2003  . Lumbar fusion  11/1998  . Gynecologic cryosurgery  02/2003  .  Achilles tendon surgery      MULTIPLE SURGERIES BECAUSE OF STAPH INFECTION    Family History  Problem Relation Age of Onset  . Ovarian cancer Cousin   . Uterine cancer Cousin   . Breast cancer Maternal Aunt   . Colon cancer Other     Allergies  Allergen Reactions  . Oxycodone-Acetaminophen Itching and Swelling    Current Outpatient Prescriptions on File Prior to Visit  Medication Sig Dispense Refill  . aspirin 325 MG tablet Take 325 mg by mouth daily.       . Calcium Carbonate-Vitamin D (CALTRATE 600+D) 600-400 MG-UNIT per tablet Take 1 tablet by mouth daily.       . clonazePAM (KLONOPIN) 0.5 MG tablet Take 0.5 mg by mouth at bedtime as needed.      . metFORMIN (GLUCOPHAGE) 500 MG tablet Take 1 tablet (500 mg total) by mouth 2 (two) times daily with a meal. Office visit is needed for refills  180 tablet  0  . Multiple Vitamins-Minerals (MULTIVITAMIN WITH MINERALS) tablet Take 1 tablet by mouth daily. Not taken in past two weeks      . omeprazole (PRILOSEC) 20 MG capsule Take 1 capsule (20 mg total) by mouth 2 (two) times daily.  60 capsule  5  . simvastatin (ZOCOR) 20 MG tablet Take 1 tablet (20 mg  total) by mouth at bedtime.  90 tablet  1   No current facility-administered medications on file prior to visit.    BP 132/84  Temp(Src) 98.6 F (37 C) (Oral)  Wt 146 lb (66.225 kg)  BMI 26.7 kg/m2       Objective:   Physical Exam  Constitutional: She appears well-developed and well-nourished.  Cardiovascular: Normal rate, regular rhythm and normal heart sounds.   Pulmonary/Chest: Effort normal and breath sounds normal. She has no wheezes.  Abdominal: Soft. Bowel sounds are normal.  Mild to RLQ and epigastric tenderness          Assessment & Plan:

## 2012-07-01 NOTE — Patient Instructions (Signed)
Our office will contact you re: HIDA scan results

## 2012-07-11 ENCOUNTER — Encounter (HOSPITAL_COMMUNITY)
Admission: RE | Admit: 2012-07-11 | Discharge: 2012-07-11 | Disposition: A | Discharge status: Home / Self Care | Payer: 59 | Source: Ambulatory Visit | Attending: Internal Medicine | Admitting: Internal Medicine

## 2012-07-11 DIAGNOSIS — R110 Nausea: Secondary | ICD-10-CM | POA: Insufficient documentation

## 2012-07-11 MED ORDER — TECHNETIUM TC 99M MEBROFENIN IV KIT
5.0000 | PACK | Freq: Once | INTRAVENOUS | Status: AC | PRN
  Administered 2012-07-11: 5 via INTRAVENOUS

## 2012-10-01 ENCOUNTER — Other Ambulatory Visit: Payer: Self-pay | Admitting: Internal Medicine

## 2012-10-10 ENCOUNTER — Ambulatory Visit: Payer: 59 | Attending: Gynecology | Admitting: Gynecology

## 2012-10-10 ENCOUNTER — Encounter: Payer: Self-pay | Admitting: Gynecology

## 2012-10-10 VITALS — BP 144/72 | HR 80 | Temp 99.0°F | Resp 16 | Ht 61.26 in | Wt 143.6 lb

## 2012-10-10 DIAGNOSIS — Z7982 Long term (current) use of aspirin: Secondary | ICD-10-CM | POA: Insufficient documentation

## 2012-10-10 DIAGNOSIS — K219 Gastro-esophageal reflux disease without esophagitis: Secondary | ICD-10-CM | POA: Insufficient documentation

## 2012-10-10 DIAGNOSIS — Z79899 Other long term (current) drug therapy: Secondary | ICD-10-CM | POA: Insufficient documentation

## 2012-10-10 DIAGNOSIS — C541 Malignant neoplasm of endometrium: Secondary | ICD-10-CM

## 2012-10-10 DIAGNOSIS — E119 Type 2 diabetes mellitus without complications: Secondary | ICD-10-CM | POA: Insufficient documentation

## 2012-10-10 DIAGNOSIS — C549 Malignant neoplasm of corpus uteri, unspecified: Secondary | ICD-10-CM | POA: Insufficient documentation

## 2012-10-10 DIAGNOSIS — E785 Hyperlipidemia, unspecified: Secondary | ICD-10-CM | POA: Insufficient documentation

## 2012-10-10 DIAGNOSIS — R197 Diarrhea, unspecified: Secondary | ICD-10-CM | POA: Insufficient documentation

## 2012-10-10 DIAGNOSIS — I890 Lymphedema, not elsewhere classified: Secondary | ICD-10-CM | POA: Insufficient documentation

## 2012-10-10 DIAGNOSIS — C569 Malignant neoplasm of unspecified ovary: Secondary | ICD-10-CM | POA: Insufficient documentation

## 2012-10-10 DIAGNOSIS — R1032 Left lower quadrant pain: Secondary | ICD-10-CM | POA: Insufficient documentation

## 2012-10-10 NOTE — Patient Instructions (Signed)
See Dr. Tenny Craw in 3 months return to see [place] in 6 months

## 2012-10-10 NOTE — Progress Notes (Signed)
Consult Note: Gyn-Onc   Jamie [name] 57 y.o. female  Chief Complaint  Patient presents with  . Endometrial cancer    follow up   assessment: Stage IA grade 1 endometrial adenocarcinoma November 2012 NED.  Stage IA ovarian cancer November 2012 NED  Lymphedema (stable)  Plan: The patient return to see Dr. Tenny Craw in 3 months return to see [place] in 6 months we will ask Dr. Tenny Craw to repeat Pap smear at his next visit.  Interval History: The patient returns today for a routine visit. Since her last visit she's done recently well. She still has intermittent abdominal discomfort. She had an ultrasound and HIDA scan which she tells me showed some gallbladder polyps. She denies any nausea vomiting. She has a bowel movement daily. Dr. Tenny Craw obtained a Pap smear that showed low-grade SIL.   She has diarrhea denies any constipation or any GU symptoms.  She denies any other health problems. Lymphedema is much better with compression stockings and occasional massage therapy.Marland Kitchen  HPI:The patient underwent a robotic hysterectomy, bilateral salpingo-oophorectomy, and pelvic lymphadenectomy on 03/20/2011 for treatment of an endometrial carcinoma. Final pathology showed a grade 1 endometrial cancer confined to the inner half of the myometrium. In addition an adenocarcinoma was also found in the left ovary. Review of the pathology report indicates that this appears to be 2 synchronous lesions. With regard to the endometrial lesion it appears to be low risk and no additional therapy was recommended. Further, the ovarian lesion was confined to the ovarian parenchyma and there is no other evidence of metastatic disease (stage IA grade 1) . At first postoperative followup a pelvic hematoma was identified on bimanual exam and confirmed with ultrasound. The hematoma ultimately resolved with observation.She also developed lymphedema which was managed with PT and compression stockings.   Review of Systems:10 point review  of systems is negative as noted above.   Vitals: Blood pressure 144/72, pulse 80, temperature 99 F (37.2 C), temperature source Oral, resp. rate 16, height 5' 1.26" (1.556 m), weight 143 lb 9.6 oz (65.137 kg).  Physical Exam: General : The patient is a healthy woman in no acute distress.  HEENT: normocephalic, extraoccular movements normal; neck is supple without thyromegally  Lynphnodes: Supraclavicular and inguinal nodes not enlarged  Abdomen: Soft, non-tender, no ascites, no organomegally, no masses, no hernias  Pelvic:  EGBUS: Normal female  Vagina: Normal, no lesions  Urethra and Bladder: Normal, non-tender  Cervix: Surgically absent  Uterus: Surgically absent  Bi-manual examination: Non-tender; no adenxal masses or nodularity  Rectal: normal sphincter tone, no masses, no blood  Lower extremities: No edema or varicosities. Normal range of motion she does have mild lymphedema of the right lower extremity (especially around the ankle)   Assessment/Plan: Stage IA grade 1 endometrial adenocarcinoma November 2012. NED. A Pap smears obtained  Stage IA ovarian cancer November 2012, NED  Lymphedema improved  LLQ pain of unknown etiology.  CT is negative for any pathology.  This may be related to her diarrhea.  Will continue to observe, but if she has worsening symptoms we should evaluate further. Return in 6 months  Allergies  Allergen Reactions  . Oxycodone-Acetaminophen Itching and Swelling    Past Medical History  Diagnosis Date  . GERD (gastroesophageal reflux disease)   . History of cervical dysplasia   . Ulcer     history of gastritis  . Allergy   . Hyperlipidemia   . PONV (postoperative nausea and vomiting)     ONLY  AFTER CERVICAL FUSION--NO PROB WITH OTHER SURGERIES  . Diabetes mellitus     ORAL MEDS-NO INSULIN  . Hiatal hernia   . Endometrial cancer 03/2011  . Ovarian cancer 03/2011    Past Surgical History  Procedure Laterality Date  . Cervical fusion   09/2003  . Lumbar fusion  11/1998  . Gynecologic cryosurgery  02/2003  . Achilles tendon surgery      MULTIPLE SURGERIES BECAUSE OF STAPH INFECTION    Current Outpatient Prescriptions  Medication Sig Dispense Refill  . fish oil-omega-3 fatty acids 1000 MG capsule Take 2 g by mouth daily.      . metFORMIN (GLUCOPHAGE) 500 MG tablet TAKE ONE TABLET BY MOUTH TWICE A DAY WITH MEALS, NEEDS OFFICE VISIT  180 tablet  0  . Multiple Vitamins-Minerals (MULTIVITAMIN WITH MINERALS) tablet Take 1 tablet by mouth daily. Not taken in past two weeks      . omeprazole (PRILOSEC) 20 MG capsule Take 1 capsule (20 mg total) by mouth 2 (two) times daily.  60 capsule  5  . simvastatin (ZOCOR) 20 MG tablet Take 1 tablet (20 mg total) by mouth at bedtime.  90 tablet  1  . aspirin 325 MG tablet Take 325 mg by mouth daily.       . Calcium Carbonate-Vitamin D (CALTRATE 600+D) 600-400 MG-UNIT per tablet Take 1 tablet by mouth daily.       . clonazePAM (KLONOPIN) 0.5 MG tablet Take 0.5 mg by mouth at bedtime as needed.       No current facility-administered medications for this visit.    History   Social History  . Marital Status: Married    Spouse Name: N/A    Number of Children: 1  . 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 Smoking status: Never Smoker   . Smokeless tobacco: Never Used  . Alcohol Use: 0.0 oz/week     Comment: once every 2-3 months  . Drug Use: No  . Sexually Active: Not Currently     Comment: because of surgery   Other Topics Concern  . Not on file   Social History Narrative   1 step son       Regular exercise- (walking)    Family History  Problem Relation Age of Onset  . Ovarian cancer Cousin   . Uterine cancer Cousin   . Breast cancer Maternal Aunt   . Colon cancer Other       CLARKE-PEARSON,Meloni Hinz L, MD 10/10/2012, 2:58 PM

## 2012-11-03 ENCOUNTER — Other Ambulatory Visit: Payer: Self-pay | Admitting: *Deleted

## 2012-11-03 MED ORDER — OMEPRAZOLE 20 MG PO CPDR: 20.0000 mg | DELAYED_RELEASE_CAPSULE | Freq: Two times a day (BID) | ORAL | Status: DC

## 2012-11-11 ENCOUNTER — Other Ambulatory Visit: Payer: Self-pay | Admitting: Internal Medicine

## 2012-11-13 ENCOUNTER — Other Ambulatory Visit: Payer: Self-pay

## 2012-11-26 ENCOUNTER — Other Ambulatory Visit: Payer: Self-pay | Admitting: Internal Medicine

## 2012-12-09 ENCOUNTER — Other Ambulatory Visit: Payer: Self-pay | Admitting: Gynecologic Oncology

## 2012-12-09 DIAGNOSIS — I890 Lymphedema, not elsewhere classified: Secondary | ICD-10-CM

## 2012-12-10 NOTE — Progress Notes (Signed)
Patient emailed requesting script for compression stockings.

## 2012-12-17 ENCOUNTER — Ambulatory Visit (INDEPENDENT_AMBULATORY_CARE_PROVIDER_SITE_OTHER)
Admission: RE | Admit: 2012-12-17 | Discharge: 2012-12-17 | Disposition: A | Discharge status: Home / Self Care | Payer: 59 | Source: Ambulatory Visit | Attending: Internal Medicine | Admitting: Internal Medicine

## 2012-12-17 ENCOUNTER — Telehealth: Payer: Self-pay | Admitting: *Deleted

## 2012-12-17 ENCOUNTER — Ambulatory Visit (INDEPENDENT_AMBULATORY_CARE_PROVIDER_SITE_OTHER): Payer: 59 | Admitting: Internal Medicine

## 2012-12-17 ENCOUNTER — Encounter: Payer: Self-pay | Admitting: Internal Medicine

## 2012-12-17 VITALS — BP 132/80 | HR 71 | Temp 98.4°F | Wt 142.0 lb

## 2012-12-17 DIAGNOSIS — M25561 Pain in right knee: Secondary | ICD-10-CM

## 2012-12-17 DIAGNOSIS — M25461 Effusion, right knee: Secondary | ICD-10-CM

## 2012-12-17 DIAGNOSIS — M25569 Pain in unspecified knee: Secondary | ICD-10-CM

## 2012-12-17 DIAGNOSIS — M25469 Effusion, unspecified knee: Secondary | ICD-10-CM

## 2012-12-17 NOTE — Telephone Encounter (Signed)
Continue with compression hose, ice as needed and ibuprofen or aleve to see if that helps

## 2012-12-17 NOTE — Progress Notes (Signed)
Subjective:    Patient ID: Jamie [name], female    DOB: 1956-04-01, 57 y.o.   MRN: [id number]  HPI  Pt presents to the clinic today with c/o right leg pain. This started 2 weeks ago but seems to have gotten worse. She does have lymphedema in that leg. She has noticed some associated warmth and swelling. The swelling is mostly around her knee. She reports the pain is deep on the inside, not near the skin. She does remember cutting her foot around the time she started noticing this pain. She does report the pain as sharp and stabbing. She has taken Aleve without relief. She has never had pain like this in her leg before.   Review of Systems      Past Medical History  Diagnosis Date  . GERD (gastroesophageal reflux disease)   . History of cervical dysplasia   . Ulcer     history of gastritis  . Allergy   . Hyperlipidemia   . PONV (postoperative nausea and vomiting)     ONLY AFTER CERVICAL FUSION--NO PROB WITH OTHER SURGERIES  . Diabetes mellitus     ORAL MEDS-NO INSULIN  . Hiatal hernia   . Endometrial cancer 03/2011  . Ovarian cancer 03/2011    Current Outpatient Prescriptions  Medication Sig Dispense Refill  . aspirin 325 MG tablet Take 325 mg by mouth daily.       . Calcium Carbonate-Vitamin D (CALTRATE 600+D) 600-400 MG-UNIT per tablet Take 1 tablet by mouth daily.       . clonazePAM (KLONOPIN) 0.5 MG tablet Take 0.5 mg by mouth at bedtime as needed.      . fish oil-omega-3 fatty acids 1000 MG capsule Take 2 g by mouth daily.      . metFORMIN (GLUCOPHAGE) 500 MG tablet TAKE ONE TABLET BY MOUTH TWICE A DAY WITH MEALS, NEEDS OFFICE VISIT  180 tablet  0  . Multiple Vitamins-Minerals (MULTIVITAMIN WITH MINERALS) tablet Take 1 tablet by mouth daily. Not taken in past two weeks      . omeprazole (PRILOSEC) 20 MG capsule Take 1 capsule (20 mg total) by mouth 2 (two) times daily.  60 capsule  5  . simvastatin (ZOCOR) 20 MG tablet TAKE ONE TABLET BY MOUTH AT BEDTIME  90 tablet  1    No current facility-administered medications for this visit.    Allergies  Allergen Reactions  . Oxycodone-Acetaminophen Itching and Swelling    Family History  Problem Relation Age of Onset  . Ovarian cancer Cousin   . Uterine cancer Cousin   . Breast cancer Maternal Aunt   . Colon cancer Other     History   Social History  . Marital Status: Married    Spouse Name: N/A    Number of Children: 1  . 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 Smoking status: Never Smoker   . Smokeless tobacco: Never Used  . Alcohol Use: 0.0 oz/week     Comment: once every 2-3 months  . Drug Use: No  . Sexual Activity: Not Currently     Comment: because of surgery   Other Topics Concern  . Not on file   Social History Narrative   1 step son       Regular exercise- (walking)     Constitutional: Denies fever, malaise, fatigue, headache or abrupt weight changes.   Cardiovascular: Pt reports swelling  of right leg. Denies chest pain, chest tightness, palpitations or swelling in the hand.  Musculoskeletal: Pt reports right leg pain. Denies decrease in range of motion, difficulty with gait, muscle pain.  Skin: Denies redness, rashes, lesions or ulcercations.  Neurological: Denies dizziness, difficulty with memory, difficulty with speech or problems with balance and coordination.   No other specific complaints in a complete review of systems (except as listed in HPI above).  Objective:   Physical Exam   BP 132/80  Pulse 71  Temp(Src) 98.4 F (36.9 C) (Oral)  Wt 142 lb (64.411 kg)  BMI 26.6 kg/m2  SpO2 97% Wt Readings from Last 3 Encounters:  12/17/12 142 lb (64.411 kg)  10/10/12 143 lb 9.6 oz (65.137 kg)  07/01/12 146 lb (66.225 kg)    General: Appears her stated age, well developed, well nourished in NAD. Skin: Warm, dry and intact. No rashes, lesions or ulcerations noted. Cardiovascular: Normal rate and  rhythm. S1,S2 noted.  No murmur, rubs or gallops noted. No JVD or BLE edema. No carotid bruits noted. Pulmonary/Chest: Normal effort and positive vesicular breath sounds. No respiratory distress. No wheezes, rales or ronchi noted.  Musculoskeletal: Normal range of motion. No signs of joint swelling. No difficulty with gait.  Neurological: Alert and oriented. Cranial nerves II-XII intact. Coordination normal. +DTRs bilaterally.   BMET    Component Value Date/Time   NA 138 05/22/2012 1520   K 4.2 05/22/2012 1520   CL 103 05/22/2012 1520   CO2 27 05/22/2012 1520   GLUCOSE 101* 05/22/2012 1520   BUN 13 05/22/2012 1520   CREATININE 0.9 05/22/2012 1520   CALCIUM 9.5 05/22/2012 1520   GFRNONAA 79* 03/14/2011 1135   GFRAA >90 03/14/2011 1135    Lipid Panel     Component Value Date/Time   CHOL 240* 07/03/2011 0811   TRIG 286.0* 07/03/2011 0811   HDL 42.10 07/03/2011 0811   CHOLHDL 6 07/03/2011 0811   VLDL 57.2* 07/03/2011 0811   LDLCALC 91 10/28/2008 0828    CBC    Component Value Date/Time   WBC 6.8 05/22/2012 1520   RBC 4.77 05/22/2012 1520   HGB 14.1 05/22/2012 1520   HCT 41.3 05/22/2012 1520   PLT 263.0 05/22/2012 1520   MCV 86.6 05/22/2012 1520   MCH 29.2 03/21/2011 0952   MCHC 34.2 05/22/2012 1520   RDW 13.7 05/22/2012 1520   LYMPHSABS 1.8 05/22/2012 1520   MONOABS 0.5 05/22/2012 1520   EOSABS 0.4 05/22/2012 1520   BASOSABS 0.0 05/22/2012 1520    Hgb A1C Lab Results  Component Value Date   HGBA1C 6.3 07/03/2011         Assessment & Plan:   Right knee pain and swelling, new onset:  Will check xray of right knee to assess for arthritis versus bony abnormality Continue Ibuprofen for pain Swelling likely due to lymphedema  RTC as needed

## 2012-12-17 NOTE — Telephone Encounter (Signed)
Called pt no answer LMOM (cell) with regina response...lmb

## 2012-12-17 NOTE — Patient Instructions (Signed)
Knee Exercises EXERCISES RANGE OF MOTION(ROM) AND STRETCHING EXERCISES These exercises may help you when beginning to rehabilitate your injury. Your symptoms may resolve with or without further involvement from your physician, physical therapist or athletic trainer. While completing these exercises, remember:   Restoring tissue flexibility helps normal motion to return to the joints. This allows healthier, less painful movement and activity.  An effective stretch should be held for at least 30 seconds.  A stretch should never be painful. You should only feel a gentle lengthening or release in the stretched tissue. STRETCH - Knee Extension, Prone  Lie on your stomach on a firm surface, such as a bed or countertop. Place your right / left knee and leg just beyond the edge of the surface. You may wish to place a towel under the far end of your right / left thigh for comfort.  Relax your leg muscles and allow gravity to straighten your knee. Your clinician may advise you to add an ankle weight if more resistance is helpful for you.  You should feel a stretch in the back of your right / left knee. Hold this position for __________ seconds. Repeat __________ times. Complete this stretch __________ times per day. * Your physician, physical therapist or athletic trainer may ask you to add ankle weight to enhance your stretch.  RANGE OF MOTION - Knee Flexion, Active  Lie on your back with both knees straight. (If this causes back discomfort, bend your opposite knee, placing your foot flat on the floor.)  Slowly slide your heel back toward your buttocks until you feel a gentle stretch in the front of your knee or thigh.  Hold for __________ seconds. Slowly slide your heel back to the starting position. Repeat __________ times. Complete this exercise __________ times per day.  STRETCH - Quadriceps, Prone   Lie on your stomach on a firm surface, such as a bed or padded floor.  Bend your right /  left knee and grasp your ankle. If you are unable to reach, your ankle or pant leg, use a belt around your foot to lengthen your reach.  Gently pull your heel toward your buttocks. Your knee should not slide out to the side. You should feel a stretch in the front of your thigh and/or knee.  Hold this position for __________ seconds. Repeat __________ times. Complete this stretch __________ times per day.  STRETCH  Hamstrings, Supine   Lie on your back. Loop a belt or towel over the ball of your right / left foot.  Straighten your right / left knee and slowly pull on the belt to raise your leg. Do not allow the right / left knee to bend. Keep your opposite leg flat on the floor.  Raise the leg until you feel a gentle stretch behind your right / left knee or thigh. Hold this position for __________ seconds. Repeat __________ times. Complete this stretch __________ times per day.  STRENGTHENING EXERCISES These exercises may help you when beginning to rehabilitate your injury. They may resolve your symptoms with or without further involvement from your physician, physical therapist or athletic trainer. While completing these exercises, remember:   Muscles can gain both the endurance and the strength needed for everyday activities through controlled exercises.  Complete these exercises as instructed by your physician, physical therapist or athletic trainer. Progress the resistance and repetitions only as guided.  You may experience muscle soreness or fatigue, but the pain or discomfort you are trying to eliminate should   never worsen during these exercises. If this pain does worsen, stop and make certain you are following the directions exactly. If the pain is still present after adjustments, discontinue the exercise until you can discuss the trouble with your clinician. STRENGTH - Quadriceps, Isometrics  Lie on your back with your right / left leg extended and your opposite knee bent.  Gradually  tense the muscles in the front of your right / left thigh. You should see either your knee cap slide up toward your hip or increased dimpling just above the knee. This motion will push the back of the knee down toward the floor/mat/bed on which you are lying.  Hold the muscle as tight as you can without increasing your pain for __________ seconds.  Relax the muscles slowly and completely in between each repetition. Repeat __________ times. Complete this exercise __________ times per day.  STRENGTH - Quadriceps, Short Arcs   Lie on your back. Place a __________ inch towel roll under your knee so that the knee slightly bends.  Raise only your lower leg by tightening the muscles in the front of your thigh. Do not allow your thigh to rise.  Hold this position for __________ seconds. Repeat __________ times. Complete this exercise __________ times per day.  OPTIONAL ANKLE WEIGHTS: Begin with ____________________, but DO NOT exceed ____________________. Increase in 1 pound/0.5 kilogram increments.  STRENGTH - Quadriceps, Straight Leg Raises  Quality counts! Watch for signs that the quadriceps muscle is working to insure you are strengthening the correct muscles and not "cheating" by substituting with healthier muscles.  Lay on your back with your right / left leg extended and your opposite knee bent.  Tense the muscles in the front of your right / left thigh. You should see either your knee cap slide up or increased dimpling just above the knee. Your thigh may even quiver.  Tighten these muscles even more and raise your leg 4 to 6 inches off the floor. Hold for __________ seconds.  Keeping these muscles tense, lower your leg.  Relax the muscles slowly and completely in between each repetition. Repeat __________ times. Complete this exercise __________ times per day.  STRENGTH - Hamstring, Curls  Lay on your stomach with your legs extended. (If you lay on a bed, your feet may hang over the  edge.)  Tighten the muscles in the back of your thigh to bend your right / left knee up to 90 degrees. Keep your hips flat on the bed/floor.  Hold this position for __________ seconds.  Slowly lower your leg back to the starting position. Repeat __________ times. Complete this exercise __________ times per day.  OPTIONAL ANKLE WEIGHTS: Begin with ____________________, but DO NOT exceed ____________________. Increase in 1 pound/0.5 kilogram increments.  STRENGTH  Quadriceps, Squats  Stand in a door frame so that your feet and knees are in line with the frame.  Use your hands for balance, not support, on the frame.  Slowly lower your weight, bending at the hips and knees. Keep your lower legs upright so that they are parallel with the door frame. Squat only within the range that does not increase your knee pain. Never let your hips drop below your knees.  Slowly return upright, pushing with your legs, not pulling with your hands. Repeat __________ times. Complete this exercise __________ times per day.  STRENGTH - Quadriceps, Wall Slides  Follow guidelines for form closely. Increased knee pain often results from poorly placed feet or knees.  Lean against   a smooth wall or door and walk your feet out 18-24 inches. Place your feet hip-width apart.  Slowly slide down the wall or door until your knees bend __________ degrees.* Keep your knees over your heels, not your toes, and in line with your hips, not falling to either side.  Hold for __________ seconds. Stand up to rest for __________ seconds in between each repetition. Repeat __________ times. Complete this exercise __________ times per day. * Your physician, physical therapist or athletic trainer will alter this angle based on your symptoms and progress. Document Released: 03/07/2005 Document Revised: 07/16/2011 Document Reviewed: 08/05/2008 [employer] Patient Information 2014 [employer]  

## 2012-12-17 NOTE — Telephone Encounter (Signed)
Called pt concerning her xray results. Pt is wanting to know what regina want her to do or recommend for the ongoing swelling & knee being hot & painful...lmb

## 2013-01-09 ENCOUNTER — Other Ambulatory Visit: Payer: Self-pay | Admitting: *Deleted

## 2013-01-09 MED ORDER — METFORMIN HCL 500 MG PO TABS: ORAL_TABLET | ORAL | Status: DC

## 2013-03-18 ENCOUNTER — Telehealth: Payer: Self-pay | Admitting: Internal Medicine

## 2013-03-18 NOTE — Telephone Encounter (Signed)
Pt has an ear ache and sore throat for a week. Fever on and off. Hard to talk. Refused another provider. Only a same day on Friday. pls advise.

## 2013-03-18 NOTE — Telephone Encounter (Signed)
appt scheduled with Jamie [name] tomorrow.  Pt will go to urgent care tonight if she can't wait till tomorrow

## 2013-03-19 ENCOUNTER — Ambulatory Visit (INDEPENDENT_AMBULATORY_CARE_PROVIDER_SITE_OTHER): Payer: 59 | Admitting: Family

## 2013-03-19 ENCOUNTER — Encounter: Payer: Self-pay | Admitting: Family

## 2013-03-19 VITALS — BP 124/82 | HR 93 | Temp 98.2°F | Wt 140.0 lb

## 2013-03-19 DIAGNOSIS — J040 Acute laryngitis: Secondary | ICD-10-CM

## 2013-03-19 DIAGNOSIS — J069 Acute upper respiratory infection, unspecified: Secondary | ICD-10-CM

## 2013-03-19 DIAGNOSIS — Z23 Encounter for immunization: Secondary | ICD-10-CM

## 2013-03-19 MED ORDER — METHYLPREDNISOLONE 4 MG PO KIT: PACK | ORAL | Status: AC

## 2013-03-19 NOTE — Progress Notes (Signed)
Subjective:    Patient ID: Jamie [name], female    DOB: Jul 28, 1955, 57 y.o.   MRN: [id number]  HPI 56 year old white female, nonsmoker, patient of W. is in today with complaints of sore throat, earache, laryngitis, and low-grade fever of 99-100x1 week. She's been taking Aleve and TheraFlu without much relief. She has a history of lymphedema, type 2 diabetes and ovarian cancer.   Review of Systems  Constitutional: Positive for fever.  HENT: Positive for congestion, sore throat and voice change.   Respiratory: Positive for cough.   Cardiovascular: Negative.   Musculoskeletal: Negative.   Skin: Negative.   Allergic/Immunologic: Negative.   Psychiatric/Behavioral: Negative.    Past Medical History  Diagnosis Date  . GERD (gastroesophageal reflux disease)   . History of cervical dysplasia   . Ulcer     history of gastritis  . Allergy   . Hyperlipidemia   . PONV (postoperative nausea and vomiting)     ONLY AFTER CERVICAL FUSION--NO PROB WITH OTHER SURGERIES  . Diabetes mellitus     ORAL MEDS-NO INSULIN  . Hiatal hernia   . Endometrial cancer 03/2011  . Ovarian cancer 03/2011    History   Social History  . Marital Status: Married    Spouse Name: N/A    Number of Children: 1  . 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 Smoking status: Never Smoker   . Smokeless tobacco: Never Used  . Alcohol Use: 0.0 oz/week     Comment: once every 2-3 months  . Drug Use: No  . Sexual Activity: Not Currently     Comment: because of surgery   Other Topics Concern  . Not on file   Social History Narrative   1 step son       Regular exercise- (walking)    Past Surgical History  Procedure Laterality Date  . Cervical fusion  09/2003  . Lumbar fusion  11/1998  . Gynecologic cryosurgery  02/2003  . Achilles tendon surgery      MULTIPLE SURGERIES BECAUSE OF STAPH INFECTION    Family History  Problem Relation Age  of Onset  . Ovarian cancer Cousin   . Uterine cancer Cousin   . Breast cancer Maternal Aunt   . Colon cancer Other     Allergies  Allergen Reactions  . Oxycodone-Acetaminophen Itching and Swelling    Current Outpatient Prescriptions on File Prior to Visit  Medication Sig Dispense Refill  . aspirin 325 MG tablet Take 325 mg by mouth daily.       . Calcium Carbonate-Vitamin D (CALTRATE 600+D) 600-400 MG-UNIT per tablet Take 1 tablet by mouth daily.       . clonazePAM (KLONOPIN) 0.5 MG tablet Take 0.5 mg by mouth at bedtime as needed.      . fish oil-omega-3 fatty acids 1000 MG capsule Take 2 g by mouth daily.      . metFORMIN (GLUCOPHAGE) 500 MG tablet TAKE ONE TABLET BY MOUTH TWICE A DAY WITH MEALS, NEEDS OFFICE VISIT  180 tablet  1  . Multiple Vitamins-Minerals (MULTIVITAMIN WITH MINERALS) tablet Take 1 tablet by mouth daily. Not taken in past two weeks      . omeprazole (PRILOSEC) 20 MG capsule Take 1 capsule (20 mg total) by mouth 2 (two) times daily.  60 capsule  5  . simvastatin (ZOCOR) 20 MG tablet TAKE ONE TABLET BY MOUTH AT BEDTIME  90 tablet  1   No current facility-administered medications on file prior to visit.    BP 124/82  Pulse 93  Temp(Src) 98.2 F (36.8 C)  Wt 140 lb (63.504 kg)chart    Objective:   Physical Exam  Constitutional: She is oriented to person, place, and time. She appears well-developed and well-nourished.  HENT:  Right Ear: External ear normal.  Left Ear: External ear normal.  Nose: Nose normal.  Mouth/Throat: Oropharynx is clear and moist.  Neck: Normal range of motion. Neck supple.  Cardiovascular: Normal rate, regular rhythm and normal heart sounds.   Pulmonary/Chest: Effort normal and breath sounds normal.  Abdominal: Soft. Bowel sounds are normal.  Neurological: She is alert and oriented to person, place, and time.  Skin: Skin is warm and dry.  Psychiatric: She has a normal mood and affect.          Assessment & Plan:   Assessment: 1. Upper respiratory infection 2. Lymphedema 3. Ovarian cancer  Plan: Medrol Dosepak as directed. Rest. Drink plenty of fluids. Patient to the office if symptoms worsen or persist. Recheck as scheduled, and as needed. Schedule complete physical soon as possible.

## 2013-03-19 NOTE — Patient Instructions (Signed)

## 2013-03-31 ENCOUNTER — Encounter: Payer: Self-pay | Admitting: Internal Medicine

## 2013-04-01 ENCOUNTER — Other Ambulatory Visit (INDEPENDENT_AMBULATORY_CARE_PROVIDER_SITE_OTHER): Payer: 59

## 2013-04-01 DIAGNOSIS — Z0000 Encounter for general adult medical examination without abnormal findings: Secondary | ICD-10-CM

## 2013-04-01 LAB — CBC WITH DIFFERENTIAL/PLATELET
Basophils Absolute: 0.1 10*3/uL (ref 0.0–0.1)
Basophils Relative: 0.8 % (ref 0.0–3.0)
Eosinophils Absolute: 0.3 10*3/uL (ref 0.0–0.7)
Eosinophils Relative: 4.8 % (ref 0.0–5.0)
HCT: 39.3 % (ref 36.0–46.0)
Hemoglobin: 13.3 g/dL (ref 12.0–15.0)
Lymphocytes Relative: 25.2 % (ref 12.0–46.0)
Lymphs Abs: 1.6 10*3/uL (ref 0.7–4.0)
MCHC: 33.8 g/dL (ref 30.0–36.0)
MCV: 86.6 fl (ref 78.0–100.0)
Monocytes Absolute: 0.5 10*3/uL (ref 0.1–1.0)
Monocytes Relative: 7 % (ref 3.0–12.0)
Neutro Abs: 4.1 10*3/uL (ref 1.4–7.7)
Neutrophils Relative %: 62.2 % (ref 43.0–77.0)
Platelets: 220 10*3/uL (ref 150.0–400.0)
RBC: 4.53 Mil/uL (ref 3.87–5.11)
RDW: 13.6 % (ref 11.5–14.6)
WBC: 6.5 10*3/uL (ref 4.5–10.5)

## 2013-04-01 LAB — BASIC METABOLIC PANEL
BUN: 15 mg/dL (ref 6–23)
CO2: 27 mEq/L (ref 19–32)
Calcium: 9.1 mg/dL (ref 8.4–10.5)
Chloride: 106 mEq/L (ref 96–112)
Creatinine, Ser: 0.8 mg/dL (ref 0.4–1.2)
GFR: 79.43 mL/min (ref >60.00)
Glucose, Bld: 111 mg/dL — ABNORMAL HIGH (ref 70–99)
Potassium: 4 mEq/L (ref 3.5–5.1)
Sodium: 139 mEq/L (ref 135–145)

## 2013-04-01 LAB — TSH: TSH: 1.42 u[IU]/mL (ref 0.35–5.50)

## 2013-04-01 LAB — POCT URINALYSIS DIPSTICK
Bilirubin, UA: NEGATIVE
Blood, UA: NEGATIVE
Glucose, UA: NEGATIVE
Ketones, UA: NEGATIVE
Nitrite, UA: NEGATIVE
Protein, UA: NEGATIVE
Spec Grav, UA: 1.02
Urobilinogen, UA: 1
pH, UA: 8

## 2013-04-01 LAB — HEPATIC FUNCTION PANEL
ALT: 19 U/L (ref 0–35)
AST: 14 U/L (ref 0–37)
Albumin: 3.6 g/dL (ref 3.5–5.2)
Alkaline Phosphatase: 65 U/L (ref 39–117)
Bilirubin, Direct: 0 mg/dL (ref 0.0–0.3)
Total Bilirubin: 0.9 mg/dL (ref 0.3–1.2)
Total Protein: 6.3 g/dL (ref 6.0–8.3)

## 2013-04-01 LAB — LIPID PANEL
Cholesterol: 232 mg/dL — ABNORMAL HIGH (ref 0–200)
HDL: 35.7 mg/dL — ABNORMAL LOW (ref >39.00)
Total CHOL/HDL Ratio: 6
Triglycerides: 321 mg/dL — ABNORMAL HIGH (ref 0.0–149.0)
VLDL: 64.2 mg/dL — ABNORMAL HIGH (ref 0.0–40.0)

## 2013-04-01 LAB — LDL CHOLESTEROL, DIRECT: Direct LDL: 142.4 mg/dL

## 2013-04-06 ENCOUNTER — Telehealth: Payer: Self-pay | Admitting: Internal Medicine

## 2013-04-06 LAB — HM DIABETES EYE EXAM

## 2013-04-06 NOTE — Telephone Encounter (Signed)
Pt has to cancel cpe due to provider  Request. Pt needs before the end of years due to insurance changing 05/07/13. Pls advise.

## 2013-04-06 NOTE — Telephone Encounter (Signed)
Lm at home, will call pt at work tomorrow.

## 2013-04-06 NOTE — Telephone Encounter (Signed)
Just combine 2 appt slots

## 2013-04-07 ENCOUNTER — Encounter: Payer: 59 | Admitting: Family

## 2013-04-07 NOTE — Telephone Encounter (Signed)
appt sch °

## 2013-04-20 ENCOUNTER — Other Ambulatory Visit (INDEPENDENT_AMBULATORY_CARE_PROVIDER_SITE_OTHER): Payer: 59

## 2013-04-20 DIAGNOSIS — E1110 Type 2 diabetes mellitus with ketoacidosis without coma: Secondary | ICD-10-CM

## 2013-04-20 DIAGNOSIS — E1310 Other specified diabetes mellitus with ketoacidosis without coma: Secondary | ICD-10-CM

## 2013-04-20 LAB — HEMOGLOBIN A1C: Hgb A1c MFr Bld: 6.4 % (ref 4.6–6.5)

## 2013-04-22 ENCOUNTER — Encounter: Payer: 59 | Admitting: Internal Medicine

## 2013-04-24 ENCOUNTER — Encounter: Payer: 59 | Admitting: Internal Medicine

## 2013-04-24 ENCOUNTER — Ambulatory Visit: Payer: 59 | Attending: Gynecology | Admitting: Gynecology

## 2013-04-24 ENCOUNTER — Other Ambulatory Visit (HOSPITAL_COMMUNITY)
Admission: RE | Admit: 2013-04-24 | Discharge: 2013-04-24 | Disposition: A | Discharge status: Home / Self Care | Payer: 59 | Source: Ambulatory Visit | Attending: Gynecology | Admitting: Gynecology

## 2013-04-24 ENCOUNTER — Encounter: Payer: Self-pay | Admitting: Gynecology

## 2013-04-24 VITALS — BP 135/82 | HR 79 | Temp 98.4°F | Resp 16 | Ht 61.0 in | Wt 137.2 lb

## 2013-04-24 DIAGNOSIS — I890 Lymphedema, not elsewhere classified: Secondary | ICD-10-CM | POA: Insufficient documentation

## 2013-04-24 DIAGNOSIS — E785 Hyperlipidemia, unspecified: Secondary | ICD-10-CM | POA: Insufficient documentation

## 2013-04-24 DIAGNOSIS — Z79899 Other long term (current) drug therapy: Secondary | ICD-10-CM | POA: Insufficient documentation

## 2013-04-24 DIAGNOSIS — C549 Malignant neoplasm of corpus uteri, unspecified: Secondary | ICD-10-CM | POA: Insufficient documentation

## 2013-04-24 DIAGNOSIS — C569 Malignant neoplasm of unspecified ovary: Secondary | ICD-10-CM | POA: Insufficient documentation

## 2013-04-24 DIAGNOSIS — Z01419 Encounter for gynecological examination (general) (routine) without abnormal findings: Secondary | ICD-10-CM | POA: Insufficient documentation

## 2013-04-24 DIAGNOSIS — C541 Malignant neoplasm of endometrium: Secondary | ICD-10-CM

## 2013-04-24 DIAGNOSIS — R1032 Left lower quadrant pain: Secondary | ICD-10-CM | POA: Insufficient documentation

## 2013-04-24 DIAGNOSIS — E119 Type 2 diabetes mellitus without complications: Secondary | ICD-10-CM | POA: Insufficient documentation

## 2013-04-24 DIAGNOSIS — K219 Gastro-esophageal reflux disease without esophagitis: Secondary | ICD-10-CM | POA: Insufficient documentation

## 2013-04-24 NOTE — Patient Instructions (Signed)
We will call you if the Pap smear report. Please see Dr. Tenny Craw in 6 months return to see [place] in one year

## 2013-04-24 NOTE — Progress Notes (Signed)
Consult Note: Gyn-Onc   Jamie [name] 57 y.o. female  Chief Complaint  Patient presents with  . Emdp ca    Follow up   Assessment: Stage IA grade 1 endometrial adenocarcinoma November 2012 NED.  Stage IA ovarian cancer November 2012 NED  Lymphedema (stable)  Plan: Pap smear is obtained. The patient return to see Dr. Tenny Craw in 6 months return to see [place] in 12 months we will ask Dr. Tenny Craw to repeat Pap smear at his next visit.  Interval History: The patient returns today for a routine visit. Since her last visit she's done well. She has modified her diet and lost approximately 7 pounds.  She denies any constipation or any GU symptoms.  She denies any other health problems. Lymphedema is much better with compression stockings and occasional massage therapy.Marland Kitchen  HPI:The patient underwent a robotic hysterectomy, bilateral salpingo-oophorectomy, and pelvic lymphadenectomy on 03/20/2011 for treatment of an endometrial carcinoma. Final pathology showed a grade 1 endometrial cancer confined to the inner half of the myometrium. In addition an adenocarcinoma was also found in the left ovary. Review of the pathology report indicates that this appears to be 2 synchronous lesions. With regard to the endometrial lesion it appears to be low risk and no additional therapy was recommended. Further, the ovarian lesion was confined to the ovarian parenchyma and there is no other evidence of metastatic disease (stage IA grade 1) . At first postoperative followup a pelvic hematoma was identified on bimanual exam and confirmed with ultrasound. The hematoma ultimately resolved with observation.She also developed lymphedema of the right leg which was managed with PT and compression stockings.   Review of Systems:10 point review of systems is negative as noted above.   Vitals: Blood pressure 135/82, pulse 79, temperature 98.4 F (36.9 C), temperature source Oral, resp. rate 16, height 5\' 1"  (1.549 m), weight 137 lb  3.2 oz (62.234 kg).  Physical Exam: General : The patient is a healthy woman in no acute distress.  HEENT: normocephalic, extraoccular movements normal; neck is supple without thyromegally  Lynphnodes: Supraclavicular and inguinal nodes not enlarged  Abdomen: Soft, non-tender, no ascites, no organomegally, no masses, no hernias  Pelvic:  EGBUS: Normal female  Vagina: Normal, no lesions  Urethra and Bladder: Normal, non-tender  Cervix: Surgically absent  Uterus: Surgically absent  Bi-manual examination: Non-tender; no adenxal masses or nodularity  Rectal: normal sphincter tone, no masses, no blood  Lower extremities: No varicosities. Normal range of motion she does have mild lymphedema of the right lower extremity (especially around the ankle)   Assessment/Plan: Stage IA grade 1 endometrial adenocarcinoma November 2012. NED. A Pap smears obtained  Stage IA ovarian cancer November 2012, NED  Lymphedema improved  LLQ pain of unknown etiology.  CT is negative for any pathology.  This may be related to her diarrhea.  Will continue to observe, but if she has worsening symptoms we should evaluate further. Return in 6 months  Allergies  Allergen Reactions  . Oxycodone-Acetaminophen Itching and Swelling    Past Medical History  Diagnosis Date  . GERD (gastroesophageal reflux disease)   . History of cervical dysplasia   . Ulcer     history of gastritis  . Allergy   . Hyperlipidemia   . PONV (postoperative nausea and vomiting)     ONLY AFTER CERVICAL FUSION--NO PROB WITH OTHER SURGERIES  . Diabetes mellitus     ORAL MEDS-NO INSULIN  . Hiatal hernia   . Endometrial cancer 03/2011  .  Ovarian cancer 03/2011    Past Surgical History  Procedure Laterality Date  . Cervical fusion  09/2003  . Lumbar fusion  11/1998  . Gynecologic cryosurgery  02/2003  . Achilles tendon surgery      MULTIPLE SURGERIES BECAUSE OF STAPH INFECTION    Current Outpatient Prescriptions  Medication Sig  Dispense Refill  . ALPRAZolam (XANAX) 0.5 MG tablet       . fish oil-omega-3 fatty acids 1000 MG capsule Take 2 g by mouth daily.      . metFORMIN (GLUCOPHAGE) 500 MG tablet TAKE ONE TABLET BY MOUTH TWICE A DAY WITH MEALS, NEEDS OFFICE VISIT  180 tablet  1  . Multiple Vitamins-Minerals (MULTIVITAMIN WITH MINERALS) tablet Take 1 tablet by mouth daily. Not taken in past two weeks      . omeprazole (PRILOSEC) 20 MG capsule Take 1 capsule (20 mg total) by mouth 2 (two) times daily.  60 capsule  5  . simvastatin (ZOCOR) 20 MG tablet TAKE ONE TABLET BY MOUTH AT BEDTIME  90 tablet  1  . aspirin 325 MG tablet Take 325 mg by mouth daily.       . Calcium Carbonate-Vitamin D (CALTRATE 600+D) 600-400 MG-UNIT per tablet Take 1 tablet by mouth daily.       . clonazePAM (KLONOPIN) 0.5 MG tablet Take 0.5 mg by mouth at bedtime as needed.       No current facility-administered medications for this visit.    History   Social History  . Marital Status: Married    Spouse Name: N/A    Number of Children: 1  . 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 Smoking status: Never Smoker   . Smokeless tobacco: Never Used  . Alcohol Use: 0.0 oz/week     Comment: once every 2-3 months  . Drug Use: No  . Sexual Activity: Not Currently     Comment: because of surgery   Other Topics Concern  . Not on file   Social History Narrative   1 step son       Regular exercise- (walking)    Family History  Problem Relation Age of Onset  . Ovarian cancer Cousin   . Uterine cancer Cousin   . Breast cancer Maternal Aunt   . Colon cancer Other       CLARKE-PEARSON,Nicklous Aburto L, MD 04/24/2013, 1:53 PM

## 2013-04-27 ENCOUNTER — Encounter: Payer: 59 | Admitting: Internal Medicine

## 2013-04-28 ENCOUNTER — Ambulatory Visit (INDEPENDENT_AMBULATORY_CARE_PROVIDER_SITE_OTHER): Payer: 59 | Admitting: Family Medicine

## 2013-04-28 ENCOUNTER — Encounter: Payer: Self-pay | Admitting: Family Medicine

## 2013-04-28 VITALS — BP 120/70 | Temp 98.8°F | Ht 62.0 in | Wt 142.0 lb

## 2013-04-28 DIAGNOSIS — Z23 Encounter for immunization: Secondary | ICD-10-CM

## 2013-04-28 DIAGNOSIS — Z0000 Encounter for general adult medical examination without abnormal findings: Secondary | ICD-10-CM

## 2013-04-28 DIAGNOSIS — E119 Type 2 diabetes mellitus without complications: Secondary | ICD-10-CM

## 2013-04-28 DIAGNOSIS — E785 Hyperlipidemia, unspecified: Secondary | ICD-10-CM

## 2013-04-28 NOTE — Progress Notes (Signed)
Pre visit review using our clinic review tool, if applicable. No additional management support is needed unless otherwise documented below in the visit note. 

## 2013-04-28 NOTE — Addendum Note (Signed)
Addended by: Beverely Low on: 04/28/2013 03:18 PM   Modules accepted: Orders

## 2013-04-28 NOTE — Addendum Note (Signed)
Addended by: Azucena Freed on: 04/28/2013 03:13 PM   Modules accepted: Orders

## 2013-04-28 NOTE — Progress Notes (Signed)
No chief complaint on file.   HPI:  Here for CPE:  Of Note: Ms. Reisch is a 57 yo F py of Dr. Olegario Messier, followed by gyn for female physicals with recent hx of endometrial and ovarian cancer - resected and managed by gyn-onc. She had recent labs with PCP with sig findings including elevated cholesterol and borderline diabetes.  Diabetes: -wanted to review labs -on metformin bid and wonders if should continue -saw eye doctor on Dec 1st -Diet: variety of foods, balance and well rounded, larger portion sizes -Exercise: no regular exercise  HLD: -reports on higher dose of statin in the past -prefers trial of increased exercise and diet to increasing dose  -Hx of HTN: no  Health Maintenance: -tdap, flu, pneumovax: reports flu done at last visit for URI in Nov; refused tdap today; wants pneumonia faccine today -Mammo: has scheduled - gets through gyn    -Alcohol, Tobacco, drug use: see social history  Review of Systems - Review of Systems  Constitutional: Negative for fever, weight loss and malaise/fatigue.  HENT: Negative for hearing loss.   Eyes: Negative for blurred vision and double vision.  Respiratory: Negative for cough and shortness of breath.   Cardiovascular: Negative for chest pain and palpitations.  Gastrointestinal: Negative for vomiting, diarrhea, blood in stool and melena.  Genitourinary: Negative for dysuria.  Musculoskeletal: Negative for falls.  Skin: Negative for rash.  Neurological: Negative for dizziness, seizures and headaches.  Endo/Heme/Allergies: Does not bruise/bleed easily.  Psychiatric/Behavioral: Negative for depression and suicidal ideas. The patient has insomnia.      Past Medical History  Diagnosis Date  . GERD (gastroesophageal reflux disease)   . History of cervical dysplasia   . Ulcer     history of gastritis  . Allergy   . Hyperlipidemia   . PONV (postoperative nausea and vomiting)     ONLY AFTER CERVICAL FUSION--NO PROB WITH OTHER  SURGERIES  . Diabetes mellitus     ORAL MEDS-NO INSULIN  . Hiatal hernia   . Endometrial cancer 03/2011  . Ovarian cancer 03/2011    Past Surgical History  Procedure Laterality Date  . Cervical fusion  09/2003  . Lumbar fusion  11/1998  . Gynecologic cryosurgery  02/2003  . Achilles tendon surgery      MULTIPLE SURGERIES BECAUSE OF STAPH INFECTION    Family History  Problem Relation Age of Onset  . Ovarian cancer Cousin   . Uterine cancer Cousin   . Breast cancer Maternal Aunt   . Colon cancer Other     History   Social History  . Marital Status: Married    Spouse Name: N/A    Number of Children: 1  . 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 Smoking status: Never Smoker   . Smokeless tobacco: Never Used  . Alcohol Use: 0.0 oz/week     Comment: once every 2-3 months  . Drug Use: No  . Sexual Activity: Not Currently     Comment: because of surgery   Other Topics Concern  . None   Social History Narrative   1 step son       Regular exercise- (walking)    Current outpatient prescriptions:ALPRAZolam (XANAX) 0.5 MG tablet, , Disp: , Rfl: ;  aspirin 325 MG tablet, Take 325 mg by mouth daily. , Disp: , Rfl: ;  Calcium Carbonate-Vitamin D (CALTRATE 600+D) 600-400 MG-UNIT per tablet, Take  1 tablet by mouth daily. , Disp: , Rfl: ;  clonazePAM (KLONOPIN) 0.5 MG tablet, Take 0.5 mg by mouth at bedtime as needed., Disp: , Rfl:  fish oil-omega-3 fatty acids 1000 MG capsule, Take 2 g by mouth daily., Disp: , Rfl: ;  metFORMIN (GLUCOPHAGE) 500 MG tablet, TAKE ONE TABLET BY MOUTH TWICE A DAY WITH MEALS, NEEDS OFFICE VISIT, Disp: 180 tablet, Rfl: 1;  Multiple Vitamins-Minerals (MULTIVITAMIN WITH MINERALS) tablet, Take 1 tablet by mouth daily. Not taken in past two weeks, Disp: , Rfl:  omeprazole (PRILOSEC) 20 MG capsule, Take 1 capsule (20 mg total) by mouth 2 (two) times daily., Disp: 60 capsule, Rfl: 5;  simvastatin  (ZOCOR) 20 MG tablet, TAKE ONE TABLET BY MOUTH AT BEDTIME, Disp: 90 tablet, Rfl: 1  EXAM:  Filed Vitals:   04/28/13 1425  BP: 120/70  Temp: 98.8 F (37.1 C)    GENERAL: vitals reviewed and listed below, alert, oriented, appears well hydrated and in no acute distress  HEENT: head atraumatic, PERRLA, normal appearance of eyes, ears, nose and mouth. moist mucus membranes.  NECK: supple, no masses or lymphadenopathy  LUNGS: clear to auscultation bilaterally, no rales, rhonchi or wheeze  CV: HRRR, no peripheral edema or cyanosis, normal pedal pulses  BREAST: deferred done by gyn  GU: deferred - done by gyn  RECTAL: refused  SKIN: no rash or abnormal lesions  MS: normal gait, moves all extremities normally  NEURO: normal muscle strength and sensation to light touch on extremities  PSYCH: normal affect, pleasant and cooperative  ASSESSMENT AND PLAN:  Discussed the following assessment and plan:  HYPERLIPIDEMIA  Diabetes mellitus, type 2  Encounter for preventive health examination  -Discussed and advised all [place] preventive services health task force level A and B recommendations for age, sex and risks.  -discussed her diabetes and cholesterol and decided she will continue metformin for now and work on diet and exercise for her cholesterol  -follow up with PCP in 4 months and recheck lipids then and consider increase in statin if needed  -foot exam done, ppsv given  -Advised at least 150 minutes of exercise per week and a healthy diet low in saturated fats and sweets and consisting of fresh fruits and vegetables, lean meats such as fish and white chicken and whole grains.  -labs, studies and vaccines per orders this encounter  Orders Placed This Encounter  Procedures  . [facility] DIABETES EYE EXAM    This external order was created through the Results Console.    There are no Patient Instructions on file for this visit.  Patient advised to return to clinic immediately  if symptoms worsen or persist or new concerns.   No Follow-up on file.  Kriste Basque R.

## 2013-05-04 ENCOUNTER — Telehealth: Payer: Self-pay | Admitting: Gynecologic Oncology

## 2013-05-04 NOTE — Telephone Encounter (Signed)
Pt notified about pap results: negative.  No questions or concerns voiced. 

## 2013-06-10 ENCOUNTER — Other Ambulatory Visit: Payer: Self-pay | Admitting: Internal Medicine

## 2013-07-18 ENCOUNTER — Other Ambulatory Visit: Payer: Self-pay | Admitting: Internal Medicine

## 2013-07-29 ENCOUNTER — Other Ambulatory Visit: Payer: Self-pay | Admitting: Internal Medicine

## 2013-07-30 ENCOUNTER — Other Ambulatory Visit: Payer: Self-pay | Admitting: Internal Medicine

## 2013-09-01 ENCOUNTER — Other Ambulatory Visit: Payer: Self-pay | Admitting: Internal Medicine

## 2013-09-15 ENCOUNTER — Other Ambulatory Visit: Payer: Self-pay | Admitting: Internal Medicine

## 2013-10-27 ENCOUNTER — Telehealth: Payer: Self-pay | Admitting: Internal Medicine

## 2013-10-27 NOTE — Telephone Encounter (Signed)
Pt does use thigh high, rx up front for p/u, pt aware

## 2013-10-27 NOTE — Telephone Encounter (Signed)
Is this okay? If so what size?

## 2013-10-27 NOTE — Telephone Encounter (Signed)
[place] for rx.  Ask pt but I think we rx ed thigh thigh high 20-30 mm Hg.

## 2013-10-27 NOTE — Telephone Encounter (Signed)
Pt need a new rx for compression stockings she found a place cheaper  [facility]  Phone number [phone]

## 2013-11-04 ENCOUNTER — Other Ambulatory Visit: Payer: Self-pay | Admitting: Internal Medicine

## 2013-11-12 ENCOUNTER — Other Ambulatory Visit: Payer: Self-pay | Admitting: Internal Medicine

## 2013-12-30 ENCOUNTER — Other Ambulatory Visit: Payer: Self-pay | Admitting: Internal Medicine

## 2014-01-13 ENCOUNTER — Other Ambulatory Visit: Payer: Self-pay | Admitting: Internal Medicine

## 2014-02-27 ENCOUNTER — Other Ambulatory Visit: Payer: Self-pay | Admitting: Internal Medicine

## 2014-03-15 ENCOUNTER — Other Ambulatory Visit: Payer: Self-pay | Admitting: Internal Medicine

## 2014-04-11 ENCOUNTER — Other Ambulatory Visit: Payer: Self-pay | Admitting: Internal Medicine

## 2014-04-19 ENCOUNTER — Ambulatory Visit: Payer: 59 | Attending: Gynecology | Admitting: Gynecology

## 2014-04-19 ENCOUNTER — Encounter: Payer: Self-pay | Admitting: Gynecology

## 2014-04-19 ENCOUNTER — Other Ambulatory Visit (HOSPITAL_COMMUNITY)
Admission: RE | Admit: 2014-04-19 | Discharge: 2014-04-19 | Disposition: A | Discharge status: Home / Self Care | Payer: 59 | Source: Ambulatory Visit | Attending: Gynecology | Admitting: Gynecology

## 2014-04-19 VITALS — BP 149/72 | HR 65 | Temp 98.4°F | Resp 18 | Ht 62.0 in | Wt 148.9 lb

## 2014-04-19 DIAGNOSIS — R1032 Left lower quadrant pain: Secondary | ICD-10-CM | POA: Diagnosis not present

## 2014-04-19 DIAGNOSIS — Z9079 Acquired absence of other genital organ(s): Secondary | ICD-10-CM | POA: Insufficient documentation

## 2014-04-19 DIAGNOSIS — C569 Malignant neoplasm of unspecified ovary: Secondary | ICD-10-CM | POA: Insufficient documentation

## 2014-04-19 DIAGNOSIS — Z90722 Acquired absence of ovaries, bilateral: Secondary | ICD-10-CM | POA: Insufficient documentation

## 2014-04-19 DIAGNOSIS — Z01411 Encounter for gynecological examination (general) (routine) with abnormal findings: Secondary | ICD-10-CM | POA: Insufficient documentation

## 2014-04-19 DIAGNOSIS — I890 Lymphedema, not elsewhere classified: Secondary | ICD-10-CM | POA: Insufficient documentation

## 2014-04-19 DIAGNOSIS — Z79899 Other long term (current) drug therapy: Secondary | ICD-10-CM | POA: Insufficient documentation

## 2014-04-19 DIAGNOSIS — Z7982 Long term (current) use of aspirin: Secondary | ICD-10-CM | POA: Insufficient documentation

## 2014-04-19 DIAGNOSIS — E119 Type 2 diabetes mellitus without complications: Secondary | ICD-10-CM | POA: Insufficient documentation

## 2014-04-19 DIAGNOSIS — C541 Malignant neoplasm of endometrium: Secondary | ICD-10-CM | POA: Diagnosis not present

## 2014-04-19 DIAGNOSIS — Z90710 Acquired absence of both cervix and uterus: Secondary | ICD-10-CM | POA: Insufficient documentation

## 2014-04-19 DIAGNOSIS — Z8542 Personal history of malignant neoplasm of other parts of uterus: Secondary | ICD-10-CM

## 2014-04-19 DIAGNOSIS — K219 Gastro-esophageal reflux disease without esophagitis: Secondary | ICD-10-CM | POA: Insufficient documentation

## 2014-04-19 DIAGNOSIS — E785 Hyperlipidemia, unspecified: Secondary | ICD-10-CM | POA: Diagnosis not present

## 2014-04-19 DIAGNOSIS — Z8543 Personal history of malignant neoplasm of ovary: Secondary | ICD-10-CM

## 2014-04-19 NOTE — Patient Instructions (Signed)
Return to see Dr. Harrington Challenger in 6 months return to see [place] in one year.

## 2014-04-19 NOTE — Addendum Note (Signed)
Addended by: Joylene John D on: 04/19/2014 09:39 AM   Modules accepted: Orders

## 2014-04-19 NOTE — Progress Notes (Signed)
Consult Note: Gyn-Onc   Jamie [name] 58 y.o. female  Chief Complaint  Patient presents with  . endometrial cancer  Assessment: Stage IA grade 1 endometrial adenocarcinoma November 2012 NED.  Stage IA ovarian cancer November 2012 NED  Lymphedema of right lower extremity (stable)  Plan: Pap smear is obtained. The patient return to see Dr. Harrington Challenger in 6 months return to see [place] in 12 months we will ask Dr. Harrington Challenger to repeat Pap smear at his next visit.  Interval History: The patient returns today for a routine visit. Since her last visit she's done well.  Unfortunately, she is slipped off of her low carbohydrate diet and regaining considerable amount of weight.  She denies any constipation or any GU symptoms.  She denies any other health problems. Lymphedema is stable with compression stockings and daily massage therapy.Marland Kitchen  HPI:The patient underwent a robotic hysterectomy, bilateral salpingo-oophorectomy, and pelvic lymphadenectomy on 03/20/2011 for treatment of an endometrial carcinoma. Final pathology showed a grade 1 endometrial cancer confined to the inner half of the myometrium. In addition an adenocarcinoma was also found in the left ovary. Review of the pathology report indicates that this appears to be 2 synchronous lesions. With regard to the endometrial lesion it appears to be low risk and no additional therapy was recommended. Further, the ovarian lesion was confined to the ovarian parenchyma and there is no other evidence of metastatic disease (stage IA grade 1) . At first postoperative followup a pelvic hematoma was identified on bimanual exam and confirmed with ultrasound. The hematoma ultimately resolved with observation.She also developed lymphedema of the right leg which was managed with PT and compression stockings.   Review of Systems:10 point review of systems is negative as noted above.   Vitals: Blood pressure 149/72, pulse 65, temperature 98.4 F (36.9 C), temperature  source Oral, resp. rate 18, height 5\' 2"  (1.575 m), weight 148 lb 14.4 oz (67.541 kg).  Physical Exam: General : The patient is a healthy woman in no acute distress.  HEENT: normocephalic, extraoccular movements normal; neck is supple without thyromegally  Lynphnodes: Supraclavicular and inguinal nodes not enlarged  Abdomen: Soft, non-tender, no ascites, no organomegally, no masses, no hernias  Pelvic:  EGBUS: Normal female  Vagina: Normal, no lesions  Urethra and Bladder: Normal, non-tender  Cervix: Surgically absent  Uterus: Surgically absent  Bi-manual examination: Non-tender; no adenxal masses or nodularity  Rectal: normal sphincter tone, no masses, no blood  Lower extremities: No varicosities. Normal range of motion she does have mild lymphedema of the right lower extremity (especially around the ankle)   Assessment/Plan: Stage IA grade 1 endometrial adenocarcinoma November 2012. NED. A Pap smears obtained  Stage IA ovarian cancer November 2012, NED  Lymphedema improved  LLQ pain of unknown etiology.  CT is negative for any pathology.  This may be related to her diarrhea.  Will continue to observe, but if she has worsening symptoms we should evaluate further. Return in 6 months  Allergies  Allergen Reactions  . Oxycodone-Acetaminophen Itching and Swelling    Past Medical History  Diagnosis Date  . GERD (gastroesophageal reflux disease)   . History of cervical dysplasia   . Ulcer     history of gastritis  . Allergy   . Hyperlipidemia   . PONV (postoperative nausea and vomiting)     ONLY AFTER CERVICAL FUSION--NO PROB WITH OTHER SURGERIES  . Diabetes mellitus     ORAL MEDS-NO INSULIN  . Hiatal hernia   . Endometrial  cancer 03/2011  . Ovarian cancer 03/2011    Past Surgical History  Procedure Laterality Date  . Cervical fusion  09/2003  . Lumbar fusion  11/1998  . Gynecologic cryosurgery  02/2003  . Achilles tendon surgery      MULTIPLE SURGERIES BECAUSE OF  STAPH INFECTION    Current Outpatient Prescriptions  Medication Sig Dispense Refill  . ALPRAZolam (XANAX) 0.5 MG tablet     . fish oil-omega-3 fatty acids 1000 MG capsule Take 2 g by mouth daily.    . metFORMIN (GLUCOPHAGE) 500 MG tablet TAKE 1 TABLET BY MOUTH TWICE A DAY WITH MEALS, NEEDS OFFICE VISIT 180 tablet 0  . omeprazole (PRILOSEC) 20 MG capsule TAKE 1 CAPSULE BY MOUTH TWICE DAILY 180 capsule 0  . simvastatin (ZOCOR) 20 MG tablet TAKE 1 TABLET BY MOUTH EVERY NIGHT AT BEDTIME 90 tablet 0  . aspirin 325 MG tablet Take 325 mg by mouth daily.     . Calcium Carbonate-Vitamin D (CALTRATE 600+D) 600-400 MG-UNIT per tablet Take 1 tablet by mouth daily.     . clonazePAM (KLONOPIN) 0.5 MG tablet Take 0.5 mg by mouth at bedtime as needed.    . Multiple Vitamins-Minerals (MULTIVITAMIN WITH MINERALS) tablet Take 1 tablet by mouth daily. Not taken in past two weeks     No current facility-administered medications for this visit.    History   Social History  . Marital Status: Married    Spouse Name: N/A    Number of Children: 1  . 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 Smoking status: Never Smoker   . Smokeless tobacco: Never Used  . Alcohol Use: 0.0 oz/week     Comment: once every 2-3 months  . Drug Use: No  . Sexual Activity: Not Currently     Comment: because of surgery   Other Topics Concern  . Not on file   Social History Narrative   1 step son       Regular exercise- (walking)    Family History  Problem Relation Age of Onset  . Ovarian cancer Cousin   . Uterine cancer Cousin   . Breast cancer Maternal Aunt   . Colon cancer Other       CLARKE-PEARSON,Noreen Mackintosh L, MD 04/19/2014, 9:30 AM

## 2014-04-21 LAB — CYTOLOGY - PAP

## 2014-04-23 ENCOUNTER — Telehealth: Payer: Self-pay | Admitting: Gynecologic Oncology

## 2014-04-23 NOTE — Telephone Encounter (Signed)
Message left for patient with pap smear results: negative.  Instructed to call for any questions or concerns.  

## 2014-04-29 ENCOUNTER — Other Ambulatory Visit: Payer: Self-pay | Admitting: Internal Medicine

## 2014-05-17 ENCOUNTER — Other Ambulatory Visit: Payer: Self-pay | Admitting: Internal Medicine

## 2014-06-03 ENCOUNTER — Other Ambulatory Visit: Payer: Self-pay | Admitting: Internal Medicine

## 2014-06-07 LAB — HM MAMMOGRAPHY: HM Mammogram: NORMAL

## 2014-06-07 LAB — HM PAP SMEAR: HM Pap smear: NORMAL

## 2014-06-09 ENCOUNTER — Other Ambulatory Visit: Payer: Self-pay | Admitting: Obstetrics and Gynecology

## 2014-06-10 ENCOUNTER — Other Ambulatory Visit: Payer: Self-pay | Admitting: Internal Medicine

## 2014-06-10 ENCOUNTER — Telehealth: Payer: Self-pay | Admitting: Internal Medicine

## 2014-06-10 LAB — CYTOLOGY - PAP

## 2014-06-10 MED ORDER — METFORMIN HCL 500 MG PO TABS: ORAL_TABLET | ORAL | Status: DC

## 2014-06-10 NOTE — Telephone Encounter (Signed)
Pt request refill metFORMIN (GLUCOPHAGE) 500 MG tablet [facility] / [address]  Pt has made cpe appt for 3/15 but needs refill bc she is out.

## 2014-06-10 NOTE — Telephone Encounter (Signed)
rx sent in electronically 

## 2014-07-13 ENCOUNTER — Other Ambulatory Visit (INDEPENDENT_AMBULATORY_CARE_PROVIDER_SITE_OTHER): Payer: 59

## 2014-07-13 DIAGNOSIS — Z0000 Encounter for general adult medical examination without abnormal findings: Secondary | ICD-10-CM

## 2014-07-13 DIAGNOSIS — R7989 Other specified abnormal findings of blood chemistry: Secondary | ICD-10-CM

## 2014-07-13 LAB — POCT URINALYSIS DIPSTICK
Blood, UA: NEGATIVE
Glucose, UA: NEGATIVE
Ketones, UA: 1.02
Nitrite, UA: NEGATIVE
Protein, UA: NEGATIVE
Spec Grav, UA: 1.02
Urobilinogen, UA: 0.2
pH, UA: 5.5

## 2014-07-13 LAB — CBC WITH DIFFERENTIAL/PLATELET
Basophils Absolute: 0 10*3/uL (ref 0.0–0.1)
Basophils Relative: 0.6 % (ref 0.0–3.0)
Eosinophils Absolute: 0.3 10*3/uL (ref 0.0–0.7)
Eosinophils Relative: 4.3 % (ref 0.0–5.0)
HCT: 41.2 % (ref 36.0–46.0)
Hemoglobin: 13.9 g/dL (ref 12.0–15.0)
Lymphocytes Relative: 22.7 % (ref 12.0–46.0)
Lymphs Abs: 1.5 10*3/uL (ref 0.7–4.0)
MCHC: 33.7 g/dL (ref 30.0–36.0)
MCV: 84.8 fl (ref 78.0–100.0)
Monocytes Absolute: 0.4 10*3/uL (ref 0.1–1.0)
Monocytes Relative: 6 % (ref 3.0–12.0)
Neutro Abs: 4.3 10*3/uL (ref 1.4–7.7)
Neutrophils Relative %: 66.4 % (ref 43.0–77.0)
Platelets: 272 10*3/uL (ref 150.0–400.0)
RBC: 4.86 Mil/uL (ref 3.87–5.11)
RDW: 13.8 % (ref 11.5–15.5)
WBC: 6.4 10*3/uL (ref 4.0–10.5)

## 2014-07-13 LAB — BASIC METABOLIC PANEL
BUN: 12 mg/dL (ref 6–23)
CO2: 30 mEq/L (ref 19–32)
Calcium: 9.8 mg/dL (ref 8.4–10.5)
Chloride: 108 mEq/L (ref 96–112)
Creatinine, Ser: 0.93 mg/dL (ref 0.40–1.20)
GFR: 65.51 mL/min (ref >60.00)
Glucose, Bld: 146 mg/dL — ABNORMAL HIGH (ref 70–99)
Potassium: 5.1 mEq/L (ref 3.5–5.1)
Sodium: 142 mEq/L (ref 135–145)

## 2014-07-13 LAB — LIPID PANEL
Cholesterol: 237 mg/dL — ABNORMAL HIGH (ref 0–200)
HDL: 36.4 mg/dL — ABNORMAL LOW (ref >39.00)
NonHDL: 200.6
Total CHOL/HDL Ratio: 7
Triglycerides: 313 mg/dL — ABNORMAL HIGH (ref 0.0–149.0)
VLDL: 62.6 mg/dL — ABNORMAL HIGH (ref 0.0–40.0)

## 2014-07-13 LAB — MICROALBUMIN / CREATININE URINE RATIO
Creatinine,U: 253.5 mg/dL
Microalb Creat Ratio: 0.9 mg/g (ref 0.0–30.0)
Microalb, Ur: 2.3 mg/dL — ABNORMAL HIGH (ref 0.0–1.9)

## 2014-07-13 LAB — TSH: TSH: 1.8 u[IU]/mL (ref 0.35–4.50)

## 2014-07-13 LAB — HEPATIC FUNCTION PANEL
ALT: 30 U/L (ref 0–35)
AST: 18 U/L (ref 0–37)
Albumin: 4.3 g/dL (ref 3.5–5.2)
Alkaline Phosphatase: 82 U/L (ref 39–117)
Bilirubin, Direct: 0.1 mg/dL (ref 0.0–0.3)
Total Bilirubin: 0.5 mg/dL (ref 0.2–1.2)
Total Protein: 6.6 g/dL (ref 6.0–8.3)

## 2014-07-13 LAB — LDL CHOLESTEROL, DIRECT: Direct LDL: 149 mg/dL

## 2014-07-13 LAB — HEMOGLOBIN A1C: Hgb A1c MFr Bld: 6.8 % — ABNORMAL HIGH (ref 4.6–6.5)

## 2014-07-19 ENCOUNTER — Encounter: Payer: Self-pay | Admitting: Internal Medicine

## 2014-07-26 ENCOUNTER — Encounter: Payer: Self-pay | Admitting: Internal Medicine

## 2014-07-26 ENCOUNTER — Ambulatory Visit (INDEPENDENT_AMBULATORY_CARE_PROVIDER_SITE_OTHER): Payer: 59 | Admitting: Internal Medicine

## 2014-07-26 VITALS — BP 148/80 | HR 68 | Temp 98.2°F | Ht 61.25 in | Wt 147.0 lb

## 2014-07-26 DIAGNOSIS — E119 Type 2 diabetes mellitus without complications: Secondary | ICD-10-CM

## 2014-07-26 DIAGNOSIS — I10 Essential (primary) hypertension: Secondary | ICD-10-CM

## 2014-07-26 DIAGNOSIS — Z0000 Encounter for general adult medical examination without abnormal findings: Secondary | ICD-10-CM | POA: Insufficient documentation

## 2014-07-26 DIAGNOSIS — C569 Malignant neoplasm of unspecified ovary: Secondary | ICD-10-CM

## 2014-07-26 DIAGNOSIS — Z23 Encounter for immunization: Secondary | ICD-10-CM | POA: Diagnosis not present

## 2014-07-26 MED ORDER — CHOLECALCIFEROL 50 MCG (2000 UT) PO CAPS: ORAL_CAPSULE | ORAL | Status: DC

## 2014-07-26 MED ORDER — SIMVASTATIN 20 MG PO TABS: 20.0000 mg | ORAL_TABLET | Freq: Every day | ORAL | Status: DC

## 2014-07-26 MED ORDER — METFORMIN HCL 500 MG PO TABS: 500.0000 mg | ORAL_TABLET | Freq: Three times a day (TID) | ORAL | Status: DC

## 2014-07-26 MED ORDER — BENAZEPRIL HCL 10 MG PO TABS: 10.0000 mg | ORAL_TABLET | Freq: Every day | ORAL | Status: DC

## 2014-07-26 NOTE — Progress Notes (Signed)
Pre visit review using our clinic review tool, if applicable. No additional management support is needed unless otherwise documented below in the visit note. 

## 2014-07-26 NOTE — Patient Instructions (Addendum)
Please complete the following lab tests within 2 weeks of starting benazepril: BMET - 401.9

## 2014-07-26 NOTE — Progress Notes (Signed)
Subjective:    Patient ID: Jamie [name], female    DOB: 05/07/1956, 59 y.o.   MRN: [id number]  HPI  59 year old white female with history of ovarian cancer, endometrial cancer and prediabetes for routine physical. Patient denies any significant medical history. She is followed by oncologist and her gynecologist. Patient reports surveillance for recurrent cancer negative.  She reports Pap and pelvic performed in February 2016. Her last mammogram was also in February performed by her GYN. No official copy to review.  Hyperlipidemia - LDL not at goal despite taking simvastatin.   Review of Systems  Constitutional: Negative for activity change, appetite change and unexpected weight change.  Eyes: Negative for visual disturbance.  Respiratory: Negative for cough, chest tightness and shortness of breath.   Cardiovascular: Negative for chest pain.  Genitourinary: Negative for difficulty urinating.  Neurological: Negative for headaches.  Gastrointestinal: Negative for abdominal pain, heartburn melena or hematochezia Psych: intermittent crying spells.  PHQ 9 - 6 Endo:  No polyuria or polydypsia        Past Medical History  Diagnosis Date  . GERD (gastroesophageal reflux disease)   . History of cervical dysplasia   . Ulcer     history of gastritis  . Allergy   . Hyperlipidemia   . PONV (postoperative nausea and vomiting)     ONLY AFTER CERVICAL FUSION--NO PROB WITH OTHER SURGERIES  . Diabetes mellitus     ORAL MEDS-NO INSULIN  . Hiatal hernia   . Endometrial cancer 03/2011  . Ovarian cancer 03/2011    History   Social History  . Marital Status: Married    Spouse Name: N/A  . Number of Children: 1  . 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 Smoking status: Never Smoker   . Smokeless tobacco: Never Used  . Alcohol Use: 0.0 oz/week     Comment: once every 2-3 months  . Drug Use: No  . Sexual Activity:  Not Currently     Comment: because of surgery   Other Topics Concern  . Not on file   Social History Narrative   1 step son       Regular exercise- (walking)    Past Surgical History  Procedure Laterality Date  . Cervical fusion  09/2003  . Lumbar fusion  11/1998  . Gynecologic cryosurgery  02/2003  . Achilles tendon surgery      MULTIPLE SURGERIES BECAUSE OF STAPH INFECTION    Family History  Problem Relation Age of Onset  . Ovarian cancer Cousin   . Uterine cancer Cousin   . Breast cancer Maternal Aunt   . Colon cancer Other     Allergies  Allergen Reactions  . Oxycodone-Acetaminophen Itching and Swelling    Current Outpatient Prescriptions on File Prior to Visit  Medication Sig Dispense Refill  . ALPRAZolam (XANAX) 0.5 MG tablet Take 0.5 mg by mouth as needed.     . Multiple Vitamins-Minerals (MULTIVITAMIN WITH MINERALS) tablet Take 1 tablet by mouth daily. Not taken in past two weeks     No current facility-administered medications on file prior to visit.    BP 148/80 mmHg  Pulse 68  Temp(Src) 98.2 F (36.8 C) (Oral)  Ht 5' 1.25" (1.556 m)  Wt 147 lb (66.679 kg)  BMI 27.54 kg/m2    Objective:   Physical Exam  Constitutional: She is oriented to person, place, and time. She  appears well-developed and well-nourished. No distress.  HENT:  Head: Normocephalic and atraumatic.  Right Ear: External ear normal.  Left Ear: External ear normal.  Mouth/Throat: Oropharynx is clear and moist.  Eyes: Conjunctivae and EOM are normal. Pupils are equal, round, and reactive to light.  Neck: Neck supple.  Cardiovascular: Normal rate, regular rhythm and normal heart sounds.   No murmur heard. Pulmonary/Chest: Effort normal and breath sounds normal. She has no wheezes.  Abdominal: Soft. Bowel sounds are normal. She exhibits no mass. There is no tenderness.  Musculoskeletal: She exhibits no edema.  Lymphadenopathy:    She has no cervical adenopathy.  Neurological: She  is alert and oriented to person, place, and time. No cranial nerve deficit.  Skin: Skin is warm and dry.  Psychiatric: She has a normal mood and affect. Her behavior is normal.          Assessment & Plan:

## 2014-07-26 NOTE — Assessment & Plan Note (Addendum)
BP not at goal.  Start benazepril 10 mg.  Monitor BMET. BP: (!) 148/80 mmHg

## 2014-07-26 NOTE — Assessment & Plan Note (Addendum)
59 year old white female with history of localized uterine and ovarian cancer for follow-up. Surveillance performed by her GYN oncologist. Patient updated with tetanus and pertussis vaccine. Obtain screening bone density scan. Weight loss encouraged.  Patient's LDL still medical despite taking simvastatin. Dietary counseling provided. Patient to decrease intake of red meats.

## 2014-07-26 NOTE — Assessment & Plan Note (Signed)
Increase metformin to 500 mg tid.  A1c trending upward.

## 2014-07-27 ENCOUNTER — Telehealth: Payer: Self-pay | Admitting: Internal Medicine

## 2014-07-27 NOTE — Telephone Encounter (Signed)
emmi mailed  °

## 2014-08-04 ENCOUNTER — Ambulatory Visit (INDEPENDENT_AMBULATORY_CARE_PROVIDER_SITE_OTHER)
Admission: RE | Admit: 2014-08-04 | Discharge: 2014-08-04 | Disposition: A | Discharge status: Home / Self Care | Payer: 59 | Source: Ambulatory Visit | Attending: Internal Medicine | Admitting: Internal Medicine

## 2014-08-04 DIAGNOSIS — Z13820 Encounter for screening for osteoporosis: Secondary | ICD-10-CM

## 2014-08-04 DIAGNOSIS — Z0000 Encounter for general adult medical examination without abnormal findings: Secondary | ICD-10-CM

## 2014-08-23 ENCOUNTER — Other Ambulatory Visit (INDEPENDENT_AMBULATORY_CARE_PROVIDER_SITE_OTHER): Payer: 59

## 2014-08-23 DIAGNOSIS — I10 Essential (primary) hypertension: Secondary | ICD-10-CM | POA: Diagnosis not present

## 2014-08-23 LAB — BASIC METABOLIC PANEL
BUN: 15 mg/dL (ref 6–23)
CO2: 27 mEq/L (ref 19–32)
Calcium: 9.8 mg/dL (ref 8.4–10.5)
Chloride: 107 mEq/L (ref 96–112)
Creatinine, Ser: 0.92 mg/dL (ref 0.40–1.20)
GFR: 66.3 mL/min (ref >60.00)
Glucose, Bld: 127 mg/dL — ABNORMAL HIGH (ref 70–99)
Potassium: 4.9 mEq/L (ref 3.5–5.1)
Sodium: 141 mEq/L (ref 135–145)

## 2014-08-25 ENCOUNTER — Telehealth: Payer: Self-pay | Admitting: Genetic Counselor

## 2014-08-25 NOTE — Telephone Encounter (Signed)
patient called and scheduled new pt gen counseling appt.    Jamie [name] 09/01/14 10am

## 2014-09-01 ENCOUNTER — Other Ambulatory Visit: Payer: 59

## 2014-09-01 ENCOUNTER — Encounter: Payer: 59 | Admitting: Genetic Counselor

## 2014-09-06 ENCOUNTER — Ambulatory Visit (INDEPENDENT_AMBULATORY_CARE_PROVIDER_SITE_OTHER): Payer: 59 | Admitting: Adult Health

## 2014-09-06 ENCOUNTER — Encounter: Payer: Self-pay | Admitting: Adult Health

## 2014-09-06 ENCOUNTER — Ambulatory Visit: Payer: 59 | Admitting: Internal Medicine

## 2014-09-06 VITALS — BP 118/78 | Temp 98.6°F | Wt 147.1 lb

## 2014-09-06 DIAGNOSIS — E119 Type 2 diabetes mellitus without complications: Secondary | ICD-10-CM | POA: Diagnosis not present

## 2014-09-06 DIAGNOSIS — I10 Essential (primary) hypertension: Secondary | ICD-10-CM

## 2014-09-06 NOTE — Progress Notes (Signed)
Pre visit review using our clinic review tool, if applicable. No additional management support is needed unless otherwise documented below in the visit note. 

## 2014-09-06 NOTE — Progress Notes (Addendum)
   Subjective:    Patient ID: Jamie [name], female    DOB: Aug 13, 1955, 59 y.o.   MRN: [id number]  HPI  Jamie [name] is a patient od Dr. Shawna Orleans. She is here for her six month follow up after starting Benazepril. She endorses feeling " much better and sleeping better." She does have occasional episodes of dizziness, but not currently in office.   Her diet is getting better as she has stopped eating red meat, and quit drinking Soda. She is drinking more water.   Exercise: She is continuing to work on this aspect. She is going to join a gym with her husband.   Review of Systems  Constitutional: Negative.   All other systems reviewed and are negative.      Objective:   Physical Exam  Constitutional: She is oriented to person, place, and time. She appears well-developed and well-nourished. No distress.  HENT:  Mouth/Throat: No oropharyngeal exudate.  Cardiovascular: Normal rate, normal heart sounds and intact distal pulses.  Exam reveals no gallop and no friction rub.   No murmur heard. Has lymphademia in right leg. Unable to palpate pedal pulse  Pulmonary/Chest: Effort normal and breath sounds normal. No respiratory distress. She has no wheezes. She has no rales. She exhibits no tenderness.  Musculoskeletal: Normal range of motion.  Neurological: She is alert and oriented to person, place, and time. No cranial nerve deficit. Coordination normal.  Skin: Skin is warm and dry. No rash noted. No erythema. No pallor.  Nursing note and vitals reviewed.      Assessment & Plan:  1. Essential hypertension - blood pressure is controlled today.  - Continue to take ACE Inhibitor as prescribed.  - Keep record of blood pressues - Follow up in two months for A1c and BP - Hemoglobin A1c; Future

## 2014-09-06 NOTE — Patient Instructions (Signed)
Please follow up with Dr. Shawna Orleans in June for a recheck of your blood pressure and A1c. You can order a automatic blood pressure cuff on [place]. We like the company [employer]. Continue to work on [employer] and exercise. If you need anything in the mean time, please let us know.

## 2014-11-05 ENCOUNTER — Other Ambulatory Visit: Payer: Self-pay | Admitting: Adult Health

## 2014-11-05 ENCOUNTER — Other Ambulatory Visit (INDEPENDENT_AMBULATORY_CARE_PROVIDER_SITE_OTHER): Payer: 59

## 2014-11-05 DIAGNOSIS — E119 Type 2 diabetes mellitus without complications: Secondary | ICD-10-CM

## 2014-11-05 LAB — BASIC METABOLIC PANEL
BUN: 12 mg/dL (ref 6–23)
CO2: 26 mEq/L (ref 19–32)
Calcium: 9.7 mg/dL (ref 8.4–10.5)
Chloride: 103 mEq/L (ref 96–112)
Creatinine, Ser: 0.93 mg/dL (ref 0.40–1.20)
GFR: 65.44 mL/min (ref >60.00)
Glucose, Bld: 126 mg/dL — ABNORMAL HIGH (ref 70–99)
Potassium: 4.3 mEq/L (ref 3.5–5.1)
Sodium: 139 mEq/L (ref 135–145)

## 2014-11-05 LAB — HEMOGLOBIN A1C: Hgb A1c MFr Bld: 6.4 % (ref 4.6–6.5)

## 2014-11-15 ENCOUNTER — Ambulatory Visit (INDEPENDENT_AMBULATORY_CARE_PROVIDER_SITE_OTHER): Payer: 59 | Admitting: Internal Medicine

## 2014-11-15 ENCOUNTER — Encounter: Payer: Self-pay | Admitting: Internal Medicine

## 2014-11-15 VITALS — BP 110/80 | HR 68 | Temp 98.9°F | Wt 142.0 lb

## 2014-11-15 DIAGNOSIS — E119 Type 2 diabetes mellitus without complications: Secondary | ICD-10-CM | POA: Diagnosis not present

## 2014-11-15 DIAGNOSIS — I10 Essential (primary) hypertension: Secondary | ICD-10-CM

## 2014-11-15 DIAGNOSIS — H6980 Other specified disorders of Eustachian tube, unspecified ear: Secondary | ICD-10-CM | POA: Diagnosis not present

## 2014-11-15 DIAGNOSIS — H6990 Unspecified Eustachian tube disorder, unspecified ear: Secondary | ICD-10-CM | POA: Insufficient documentation

## 2014-11-15 MED ORDER — METFORMIN HCL 500 MG PO TABS: 500.0000 mg | ORAL_TABLET | Freq: Three times a day (TID) | ORAL | Status: DC

## 2014-11-15 MED ORDER — SIMVASTATIN 20 MG PO TABS: 20.0000 mg | ORAL_TABLET | Freq: Every day | ORAL | Status: DC

## 2014-11-15 MED ORDER — BENAZEPRIL HCL 10 MG PO TABS: 10.0000 mg | ORAL_TABLET | Freq: Every day | ORAL | Status: DC

## 2014-11-15 NOTE — Assessment & Plan Note (Signed)
Patient reports experiencing "hollow" sensation of her head several weeks ago. It was self-limited. I suspect her symptoms secondary to eustachian tube dysfunction. Patient understands to use over-the-counter intranasal steroids should her symptoms recur.

## 2014-11-15 NOTE — Progress Notes (Signed)
Subjective:    Patient ID: Jamie [name], female    DOB: [birth year], 59 y.o.   MRN: [id number]  HPI  59 year old white female with history of ovarian cancer, type 2 diabetes and hypertension for routine follow-up. 3 months ago patient was started on Benazepril 10 mg.  Patient has been tolerating ACE inhibitor without side effects. Her recent electrolytes and kidney function were normal. BP control improved.  Type 2 diabetes-good compliance with metformin and diabetic diet. A1c improved 6.4.  She reports episode of "hollow sensation" of her head. She noticed symptoms while brushing hair and touching her ears.  It lasted 2 weeks - now resolved   Review of Systems Negative for chest pain or shortness of breath.  Mild weight loss    Past Medical History  Diagnosis Date  . GERD (gastroesophageal reflux disease)   . History of cervical dysplasia   . Ulcer     history of gastritis  . Allergy   . Hyperlipidemia   . PONV (postoperative nausea and vomiting)     ONLY AFTER CERVICAL FUSION--NO PROB WITH OTHER SURGERIES  . Diabetes mellitus     ORAL MEDS-NO INSULIN  . Hiatal hernia   . Endometrial cancer 03/2011  . Ovarian cancer 03/2011    History   Social History  . Marital Status: Married    Spouse Name: N/A  . Number of Children: 1  . 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 Smoking status: Never Smoker   . Smokeless tobacco: Never Used  . Alcohol Use: 0.0 oz/week     Comment: once every 2-3 months  . Drug Use: No  . Sexual Activity: Not Currently     Comment: because of surgery   Other Topics Concern  . Not on file   Social History Narrative   1 step son       Regular exercise- (walking)    Past Surgical History  Procedure Laterality Date  . Cervical fusion  09/2003  . Lumbar fusion  11/1998  . Gynecologic cryosurgery  02/2003  . Achilles tendon surgery      MULTIPLE SURGERIES BECAUSE OF STAPH  INFECTION    Family History  Problem Relation Age of Onset  . Ovarian cancer Cousin   . Uterine cancer Cousin   . Breast cancer Maternal Aunt   . Colon cancer Other     Allergies  Allergen Reactions  . Oxycodone-Acetaminophen Itching and Swelling    Current Outpatient Prescriptions on File Prior to Visit  Medication Sig Dispense Refill  . ALPRAZolam (XANAX) 0.5 MG tablet Take 0.5 mg by mouth as needed.     . Cholecalciferol (KP VITAMIN D) 2000 UNITS CAPS Take 2000 units once daily (Patient not taking: Reported on 11/15/2014) 30 each   . Multiple Vitamins-Minerals (MULTIVITAMIN WITH MINERALS) tablet Take 1 tablet by mouth daily. Not taken in past two weeks     No current facility-administered medications on file prior to visit.    BP 110/80 mmHg  Pulse 68  Temp(Src) 98.9 F (37.2 C) (Oral)  Wt 142 lb (64.411 kg)  Lab Results  Component Value Date   HGBA1C 6.4 11/05/2014   HGBA1C 6.8* 07/13/2014   HGBA1C 6.4 04/20/2013   Lab Results  Component Value Date   MICROALBUR 2.3* 07/13/2014   LDLCALC 91 10/28/2008   CREATININE 0.93 11/05/2014   Wt Readings from Last 3 Encounters:  11/15/14 142 lb (64.411 kg)  09/06/14 147 lb 1.6 oz (66.724 kg)  07/26/14 147 lb (66.679 kg)     Objective:   Physical Exam  Constitutional: She is oriented to person, place, and time. She appears well-developed and well-nourished. No distress.  HENT:  Head: Normocephalic and atraumatic.  Right Ear: External ear normal.  Left Ear: External ear normal.  Neck: Neck supple.  No carotid bruit  Cardiovascular: Normal rate, regular rhythm and normal heart sounds.   Pulmonary/Chest: Effort normal and breath sounds normal. She has no wheezes.  Musculoskeletal: She exhibits no edema.  Lymphadenopathy:    She has no cervical adenopathy.  Neurological: She is alert and oriented to person, place, and time. No cranial nerve deficit.  Skin: Skin is warm and dry.  Psychiatric: She has a normal mood  and affect. Her behavior is normal.          Assessment & Plan:

## 2014-11-15 NOTE — Assessment & Plan Note (Signed)
Improved with higher dose of metformin 500 mg tid and lifestyle changes.  Regular exercise encouraged. Reassess in 6 months.

## 2014-11-15 NOTE — Progress Notes (Signed)
Pre visit review using our clinic review tool, if applicable. No additional management support is needed unless otherwise documented below in the visit note. Lab Results  Component Value Date   HGBA1C 6.4 11/05/2014   HGBA1C 6.8* 07/13/2014   HGBA1C 6.4 04/20/2013   Lab Results  Component Value Date   MICROALBUR 2.3* 07/13/2014   LDLCALC 91 10/28/2008   CREATININE 0.93 11/05/2014

## 2014-11-15 NOTE — Assessment & Plan Note (Signed)
Well controlled.  Continue benazepril 10 mg.  Patient understands to avoid dehydration and prolonged use of NSAIDs.

## 2014-11-15 NOTE — Patient Instructions (Signed)
Please complete the following lab tests before your next follow up appointment: BMET, A1c - 250.00 

## 2014-12-10 ENCOUNTER — Other Ambulatory Visit: Payer: Self-pay | Admitting: Internal Medicine

## 2015-01-21 ENCOUNTER — Encounter: Payer: Self-pay | Admitting: Gastroenterology

## 2015-04-06 ENCOUNTER — Encounter: Payer: Self-pay | Admitting: Cardiovascular Disease

## 2015-04-15 ENCOUNTER — Encounter: Payer: Self-pay | Admitting: Gynecology

## 2015-04-15 ENCOUNTER — Ambulatory Visit: Payer: 59 | Attending: Gynecology | Admitting: Gynecology

## 2015-04-15 VITALS — BP 126/73 | HR 98 | Temp 97.8°F | Resp 18 | Ht 61.25 in | Wt 125.5 lb

## 2015-04-15 DIAGNOSIS — I890 Lymphedema, not elsewhere classified: Secondary | ICD-10-CM | POA: Diagnosis not present

## 2015-04-15 DIAGNOSIS — C541 Malignant neoplasm of endometrium: Secondary | ICD-10-CM | POA: Diagnosis present

## 2015-04-15 DIAGNOSIS — Z8542 Personal history of malignant neoplasm of other parts of uterus: Secondary | ICD-10-CM | POA: Diagnosis not present

## 2015-04-15 DIAGNOSIS — Z8543 Personal history of malignant neoplasm of ovary: Secondary | ICD-10-CM | POA: Diagnosis not present

## 2015-04-15 NOTE — Patient Instructions (Signed)
Plan to follow up in one year with Dr. Fermin Schwab or sooner if needed.  Please call closer to the date to schedule.  Happy Holidays to you!

## 2015-04-15 NOTE — Progress Notes (Signed)
Consult Note: Gyn-Onc   Jamie [name] 59 y.o. female  Chief Complaint  Patient presents with  . endometrial cancer    follow-up  Assessment: Stage IA grade 1 endometrial adenocarcinoma November 2012 NED.  Stage IA ovarian cancer November 2012 NED  Lymphedema of right lower extremity (stable)  Plan:   The patient return  to see [place] in 12 months    Interval History: The patient returns today for a routine visit. Since her last visit she's done well.  She is return to her diet Program is lost some weight which she is very happy about. She is up-to-date with mammograms..  She denies any constipation or any GU symptoms.  She denies any other health problems. Lymphedema is stable with compression stockings and daily massage therapy.Marland Kitchen  HPI:The patient underwent a robotic hysterectomy, bilateral salpingo-oophorectomy, and pelvic lymphadenectomy on 03/20/2011 for treatment of an endometrial carcinoma. Final pathology showed a grade 1 endometrial cancer confined to the inner half of the myometrium. In addition an adenocarcinoma was also found in the left ovary. Review of the pathology report indicates that this appears to be 2 synchronous lesions. With regard to the endometrial lesion it appears to be low risk and no additional therapy was recommended. Further, the ovarian lesion was confined to the ovarian parenchyma and there is no other evidence of metastatic disease (stage IA grade 1) . At first postoperative followup a pelvic hematoma was identified on bimanual exam and confirmed with ultrasound. The hematoma ultimately resolved with observation.She also developed lymphedema of the right leg which was managed with PT and compression stockings.   Review of Systems:10 point review of systems is negative as noted above.   Vitals: Blood pressure 126/73, pulse 98, temperature 97.8 F (36.6 C), temperature source Oral, resp. rate 18, height 5' 1.25" (1.556 m), weight 125 lb 8 oz (56.926 kg),  SpO2 100 %.  Physical Exam: General : The patient is a healthy woman in no acute distress.  HEENT: normocephalic, extraoccular movements normal; neck is supple without thyromegally  Lynphnodes: Supraclavicular and inguinal nodes not enlarged  Abdomen: Soft, non-tender, no ascites, no organomegally, no masses, no hernias  Pelvic:  EGBUS: Normal female  Vagina: Normal, no lesions  Urethra and Bladder: Normal, non-tender  Cervix: Surgically absent  Uterus: Surgically absent  Bi-manual examination: Non-tender; no adenxal masses or nodularity  Rectal: normal sphincter tone, no masses, no blood  Lower extremities: No varicosities. Normal range of motion she does have mild lymphedema of the right lower extremity (especially around the ankle)  Allergies  Allergen Reactions  . Oxycodone-Acetaminophen Itching and Swelling    Past Medical History  Diagnosis Date  . GERD (gastroesophageal reflux disease)   . History of cervical dysplasia   . Ulcer     history of gastritis  . Allergy   . Hyperlipidemia   . PONV (postoperative nausea and vomiting)     ONLY AFTER CERVICAL FUSION--NO PROB WITH OTHER SURGERIES  . Diabetes mellitus     ORAL MEDS-NO INSULIN  . Hiatal hernia   . Endometrial cancer ([place]) 03/2011  . Ovarian cancer ([place]) 03/2011    Past Surgical History  Procedure Laterality Date  . Cervical fusion  09/2003  . Lumbar fusion  11/1998  . Gynecologic cryosurgery  02/2003  . Achilles tendon surgery      MULTIPLE SURGERIES BECAUSE OF STAPH INFECTION    Current Outpatient Prescriptions  Medication Sig Dispense Refill  . ALPRAZolam (XANAX) 0.5 MG tablet Take 0.5 mg  by mouth as needed.     . benazepril (LOTENSIN) 10 MG tablet TAKE 1 TABLET(10 MG) BY MOUTH DAILY 30 tablet 5  . metFORMIN (GLUCOPHAGE) 500 MG tablet Take 1 tablet (500 mg total) by mouth 3 (three) times daily before meals. 270 tablet 1  . Multiple Vitamins-Minerals (MULTIVITAMIN WITH MINERALS) tablet Take 1 tablet by  mouth daily. Not taken in past two weeks    . simvastatin (ZOCOR) 20 MG tablet Take 1 tablet (20 mg total) by mouth at bedtime. 90 tablet 1  . Cholecalciferol (KP VITAMIN D) 2000 UNITS CAPS Take 2000 units once daily (Patient not taking: Reported on 11/15/2014) 30 each    No current facility-administered medications for this visit.    Social History   Social History  . Marital Status: Married    Spouse Name: N/A  . Number of Children: 1  . 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 Smoking status: Never Smoker   . Smokeless tobacco: Never Used  . Alcohol Use: 0.0 oz/week     Comment: once every 2-3 months  . Drug Use: No  . Sexual Activity: Yes   Other Topics Concern  . Not on file   Social History Narrative   1 step son       Regular exercise- (walking)    Family History  Problem Relation Age of Onset  . Ovarian cancer Cousin   . Uterine cancer Cousin   . Breast cancer Maternal Aunt   . Colon cancer Paternal Grandmother       Alvino Chapel, MD 04/15/2015, 11:45 AM

## 2015-04-22 ENCOUNTER — Encounter: Payer: 59 | Admitting: Gastroenterology

## 2015-05-06 ENCOUNTER — Ambulatory Visit (AMBULATORY_SURGERY_CENTER): Payer: Self-pay | Admitting: *Deleted

## 2015-05-06 VITALS — Ht 62.0 in | Wt 127.0 lb

## 2015-05-06 DIAGNOSIS — Z86010 Personal history of colonic polyps: Secondary | ICD-10-CM

## 2015-05-06 MED ORDER — NA SULFATE-K SULFATE-MG SULF 17.5-3.13-1.6 GM/177ML PO SOLN: ORAL | Status: DC

## 2015-05-06 NOTE — Progress Notes (Signed)
Patient denies any allergies to eggs or soy. Patient denies any problems with anesthesia/sedation. Patient denies any oxygen use at home and does not take any diet/weight loss medications. EMMI education assisgned to patient on colonoscopy, this was explained and instructions given to patient. 

## 2015-05-20 ENCOUNTER — Ambulatory Visit (AMBULATORY_SURGERY_CENTER): Payer: 59 | Admitting: Gastroenterology

## 2015-05-20 ENCOUNTER — Encounter: Payer: Self-pay | Admitting: Gastroenterology

## 2015-05-20 VITALS — BP 156/90 | HR 67 | Temp 96.0°F | Resp 19 | Ht 62.0 in | Wt 127.0 lb

## 2015-05-20 DIAGNOSIS — Z86010 Personal history of colonic polyps: Secondary | ICD-10-CM

## 2015-05-20 LAB — GLUCOSE, CAPILLARY
Glucose-Capillary: 82 mg/dL (ref 65–99)
Glucose-Capillary: 86 mg/dL (ref 65–99)

## 2015-05-20 MED ORDER — SODIUM CHLORIDE 0.9 % IV SOLN: 500.0000 mL | INTRAVENOUS | Status: DC

## 2015-05-20 NOTE — Op Note (Signed)
[facility]  [address] [place], [id number]   COLONOSCOPY PROCEDURE REPORT  PATIENT: [name], Jamie  MR#: [id number] BIRTHDATE: 01-14-1956 , 59  yrs. old GENDER: female ENDOSCOPIST: Milus Banister, MD PROCEDURE DATE:  05/20/2015 PROCEDURE:   Colonoscopy, surveillance First Screening Colonoscopy - Avg.  risk and is 50 yrs.  old or older - No.  Prior Negative Screening - Now for repeat screening. N/A  History of Adenoma - Now for follow-up colonoscopy & has been > or = to 3 yrs.  Yes hx of adenoma.  Has been 3 or more years since last colonoscopy.  high risk ASA CLASS:   Class II INDICATIONS:Surveillance due to prior colonic neoplasia and Colonsocopy 2010 (adenomas, one was >1cm, piecemeal resected). Colonoscopy 2011, two small TAs. MEDICATIONS: Monitored anesthesia care and Propofol 250 mg IV  DESCRIPTION OF PROCEDURE:   After the risks benefits and alternatives of the procedure were thoroughly explained, informed consent was obtained.  The digital rectal exam revealed no abnormalities of the rectum.   The [id number]  endoscope was introduced through the anus and advanced to the cecum, which was identified by both the appendix and ileocecal valve. No adverse events experienced.   The quality of the prep was excellent.  The instrument was then slowly withdrawn as the colon was fully examined. Estimated blood loss is zero unless otherwise noted in this procedure report.   COLON FINDINGS: Small external and internal hemorrhoids were found. The examination was otherwise normal.  Retroflexed views revealed no abnormalities. The time to cecum = 7.5 Withdrawal time = 7.1 The scope was withdrawn and the procedure completed. COMPLICATIONS: There were no immediate complications.  ENDOSCOPIC IMPRESSION: 1.   Small external and internal hemorrhoids 2.   The examination was otherwise normal  RECOMMENDATIONS: Given your personal history of adenomatous  (pre-cancerous) polyps, you will need a repeat colonoscopy in 5 years.  eSigned:  Milus Banister, MD 05/20/2015 8:42 AM

## 2015-05-20 NOTE — Patient Instructions (Addendum)
YOU HAD AN ENDOSCOPIC PROCEDURE TODAY AT [facility]:   Refer to the procedure report that was given to you for any specific questions about what was found during the examination.  If the procedure report does not answer your questions, please call your gastroenterologist to clarify.  If you requested that your care partner not be given the details of your procedure findings, then the procedure report has been included in a sealed envelope for you to review at your convenience later.  YOU SHOULD EXPECT: Some feelings of bloating in the abdomen. Passage of more gas than usual.  Walking can help get rid of the air that was put into your GI tract during the procedure and reduce the bloating. If you had a lower endoscopy (such as a colonoscopy or flexible sigmoidoscopy) you may notice spotting of blood in your stool or on the toilet paper. If you underwent a bowel prep for your procedure, you may not have a normal bowel movement for a few days.  Please Note:  You might notice some irritation and congestion in your nose or some drainage.  This is from the oxygen used during your procedure.  There is no need for concern and it should clear up in a day or so.  SYMPTOMS TO REPORT IMMEDIATELY:   Following lower endoscopy (colonoscopy or flexible sigmoidoscopy):  Excessive amounts of blood in the stool  Significant tenderness or worsening of abdominal pains  Swelling of the abdomen that is new, acute  Fever of 100F or higher  For urgent or emergent issues, a gastroenterologist can be reached at any hour by calling [phone].   DIET: Your first meal following the procedure should be a small meal and then it is ok to progress to your normal diet. Heavy or fried foods are harder to digest and may make you feel nauseous or bloated.  Likewise, meals heavy in dairy and vegetables can increase bloating.  Drink plenty of fluids but you should avoid alcoholic beverages for 24  hours.  ACTIVITY:  You should plan to take it easy for the rest of today and you should NOT DRIVE or use heavy machinery until tomorrow (because of the sedation medicines used during the test).    FOLLOW UP: Our staff will call the number listed on your records the next business day following your procedure to check on you and address any questions or concerns that you may have regarding the information given to you following your procedure. If we do not reach you, we will leave a message.  However, if you are feeling well and you are not experiencing any problems, there is no need to return our call.  We will assume that you have returned to your regular daily activities without incident.  If any biopsies were taken you will be contacted by phone or by letter within the next 1-3 weeks.  Please call us at [phone] if you have not heard about the biopsies in 3 weeks.    SIGNATURES/CONFIDENTIALITY: You and/or your care partner have signed paperwork which will be entered into your electronic medical record.  These signatures attest to the fact that that the information above on your After Visit Summary has been reviewed and is understood.  Full responsibility of the confidentiality of this discharge information lies with you and/or your care-partner.  Hemorrhoid handout provided Future office appointment scheduled Next colonoscopy in 5 years

## 2015-05-20 NOTE — Progress Notes (Signed)
Report to PACU, RN, vss, BBS= Clear.  

## 2015-05-23 ENCOUNTER — Telehealth: Payer: Self-pay

## 2015-05-23 NOTE — Telephone Encounter (Signed)
  Follow up Call-  Call back number 05/20/2015  Post procedure Call Back phone  # [phone]  Permission to leave phone message Yes     Patient questions:  Do you have a fever, pain , or abdominal swelling? No. Pain Score  0 *  Have you tolerated food without any problems? Yes.    Have you been able to return to your normal activities? Yes.    Do you have any questions about your discharge instructions: Diet   No. Medications  No. Follow up visit  No.  Do you have questions or concerns about your Care? No.  Actions: * If pain score is 4 or above: No action needed, pain <4.

## 2015-05-26 ENCOUNTER — Other Ambulatory Visit: Payer: Self-pay | Admitting: Internal Medicine

## 2015-06-06 ENCOUNTER — Other Ambulatory Visit: Payer: Self-pay | Admitting: Internal Medicine

## 2015-07-13 ENCOUNTER — Ambulatory Visit: Payer: 59 | Admitting: Gastroenterology

## 2015-07-20 ENCOUNTER — Other Ambulatory Visit: Payer: Self-pay | Admitting: Obstetrics and Gynecology

## 2015-07-21 LAB — LIPID PANEL
Cholesterol: 186 mg/dL (ref 0–200)
HDL: 52 mg/dL (ref 35–70)
LDL Cholesterol: 105 mg/dL
Triglycerides: 147 mg/dL (ref 40–160)

## 2015-07-21 LAB — CYTOLOGY - PAP

## 2015-07-21 LAB — HEMOGLOBIN A1C: Hemoglobin A1C: 6.1

## 2015-08-28 ENCOUNTER — Other Ambulatory Visit: Payer: Self-pay | Admitting: Internal Medicine

## 2015-08-29 ENCOUNTER — Other Ambulatory Visit: Payer: Self-pay | Admitting: Internal Medicine

## 2015-12-11 ENCOUNTER — Other Ambulatory Visit: Payer: Self-pay | Admitting: Internal Medicine

## 2015-12-13 NOTE — Telephone Encounter (Signed)
Called Patient and scheduled her to see Dr. Yong Channel 12/22/15 to establish care.

## 2015-12-13 NOTE — Telephone Encounter (Signed)
Please call to re-establish with a provider.  Thanks!

## 2015-12-14 ENCOUNTER — Other Ambulatory Visit: Payer: Self-pay | Admitting: *Deleted

## 2015-12-14 MED ORDER — SIMVASTATIN 20 MG PO TABS: 20.0000 mg | ORAL_TABLET | Freq: Every day | ORAL | 1 refills | Status: DC

## 2015-12-22 ENCOUNTER — Ambulatory Visit (INDEPENDENT_AMBULATORY_CARE_PROVIDER_SITE_OTHER): Payer: 59 | Admitting: Family Medicine

## 2015-12-22 ENCOUNTER — Encounter: Payer: Self-pay | Admitting: Family Medicine

## 2015-12-22 VITALS — BP 120/78 | HR 96 | Temp 98.6°F | Ht 61.0 in | Wt 148.0 lb

## 2015-12-22 DIAGNOSIS — E119 Type 2 diabetes mellitus without complications: Secondary | ICD-10-CM

## 2015-12-22 DIAGNOSIS — I10 Essential (primary) hypertension: Secondary | ICD-10-CM

## 2015-12-22 DIAGNOSIS — E785 Hyperlipidemia, unspecified: Secondary | ICD-10-CM | POA: Diagnosis not present

## 2015-12-22 DIAGNOSIS — M797 Fibromyalgia: Secondary | ICD-10-CM | POA: Diagnosis not present

## 2015-12-22 MED ORDER — ALPRAZOLAM 0.5 MG PO TABS: 0.5000 mg | ORAL_TABLET | Freq: Every evening | ORAL | 3 refills | Status: DC | PRN

## 2015-12-22 MED ORDER — METFORMIN HCL 500 MG PO TABS: ORAL_TABLET | ORAL | 3 refills | Status: DC

## 2015-12-22 NOTE — Assessment & Plan Note (Signed)
Noted 2008. Stays active and deals with it- about 3x a week xanax- helps her deal with the stress of it- mainly for sleep. She has been out for some time and has struggled to sleep a few times a week- I am willing to refill and did today for sparing use

## 2015-12-22 NOTE — Assessment & Plan Note (Signed)
S: well controlled on simvastatin 20mg  on last labs (labs 2016 was not compliant). No myalgias.  Lab Results  Component Value Date   CHOL 237 (H) 07/13/2014   HDL 36.40 (L) 07/13/2014   LDLCALC 91 10/28/2008   LDLDIRECT 149.0 07/13/2014   TRIG 313.0 (H) 07/13/2014   CHOLHDL 7 07/13/2014   A/P: will get records from most recent labs when was better on compliacne than above labs

## 2015-12-22 NOTE — Progress Notes (Signed)
Pre visit review using our clinic review tool, if applicable. No additional management support is needed unless otherwise documented below in the visit note. 

## 2015-12-22 NOTE — Progress Notes (Signed)
Phone: [phone]  Subjective:  Patient presents today to establish care with me as their new primary care provider. Patient was formerly a patient of Dr. Shawna Orleans. Chief complaint-noted.   See problem oriented charting -ROS- No chest pain or shortness of breath. No headache or blurry vision.   The following were reviewed and entered/updated in epic: Past Medical History:  Diagnosis Date  . Allergy   . Diabetes mellitus    ORAL MEDS-NO INSULIN  . Endometrial cancer ([place]) 03/2011   03/2011  . GERD (gastroesophageal reflux disease)   . Hiatal hernia   . History of cervical dysplasia   . Hyperlipidemia   . Malignant neoplasm of body of uterus ([place]) 03/21/2011   S.p hysterectomy  . Ovarian cancer ([place]) 03/2011   03/2011  . PONV (postoperative nausea and vomiting)    ONLY AFTER CERVICAL FUSION--NO PROB WITH OTHER SURGERIES  . Ulcer    history of gastritis   Patient Active Problem List   Diagnosis Date Noted  . Diabetes mellitus, type 2 ([place]) 04/25/2007    Priority: High  . Ovarian cancer ([place]) 04/18/2012    Priority: Medium  . Lymphedema of leg 08/27/2011    Priority: Medium  . Hyperlipidemia 01/27/2007    Priority: Medium  . Essential hypertension 01/27/2007    Priority: Medium  . Fibromyalgia 01/27/2007    Priority: Medium  . Abdominal pain 05/22/2012    Priority: Low  . Nonspecific elevation of levels of transaminase or lactic acid dehydrogenase (LDH) 08/03/2010    Priority: Low  . Allergic rhinitis 01/27/2007    Priority: Low  . GERD 01/27/2007    Priority: Low  . MENOPAUSE-RELATED VASOMOTOR SYMPTOMS, HOT FLASHES 01/27/2007    Priority: Low  . CHEST PAIN, ATYPICAL 01/27/2007    Priority: Low   Past Surgical History:  Procedure Laterality Date  . ACHILLES TENDON SURGERY     MULTIPLE SURGERIES BECAUSE OF STAPH INFECTION  . CERVICAL FUSION  09/2003  . GYNECOLOGIC CRYOSURGERY  02/2003  . LUMBAR FUSION  11/1998  . LYMPH NODE DISSECTION  2012  . TOTAL VAGINAL  HYSTERECTOMY  03/29/2011    Family History  Problem Relation Age of Onset  . Diabetes Father   . Heart disease Father   . Prostate cancer Father   . Lymphoma Father   . Lung cancer Mother   . Ovarian cancer Cousin   . Uterine cancer Cousin   . Breast cancer Maternal Aunt   . Colon cancer Paternal Grandmother 70  . Hemochromatosis Maternal Grandmother   . Diabetes Paternal Grandfather   . Lung cancer Sister   . Healthy Brother     Medications- reviewed and updated Current Outpatient Prescriptions  Medication Sig Dispense Refill  . ALPRAZolam (XANAX) 0.5 MG tablet Take 0.5 mg by mouth as needed.     . benazepril (LOTENSIN) 10 MG tablet TAKE 1 TABLET(10 MG) BY MOUTH DAILY 90 tablet 0  . Cholecalciferol (KP VITAMIN D) 2000 UNITS CAPS Take 2000 units once daily (Patient not taking: Reported on 11/15/2014) 30 each   . metFORMIN (GLUCOPHAGE) 500 MG tablet TAKE 1 TABLET BY MOUTH THREE TIMES DAILY BEFORE MEALS. 270 tablet 0  . Multiple Vitamins-Minerals (MULTIVITAMIN WITH MINERALS) tablet Take 1 tablet by mouth daily. Not taken in past two weeks    . simvastatin (ZOCOR) 20 MG tablet Take 1 tablet (20 mg total) by mouth at bedtime. 90 tablet 1   No current facility-administered medications for this visit.     Allergies-reviewed and  updated Allergies  Allergen Reactions  . Oxycodone-Acetaminophen Itching and Swelling    Tylox    Social History   Social History  . Marital status: Married    Spouse name: N/A  . Number of children: 1  . Years of education: N/A   Occupational History  .      receptionist   Social History Main Topics  . Smoking status: Never Smoker  . Smokeless tobacco: Never Used  . Alcohol use 0.0 oz/week     Comment: once every 2-3 months  . Drug use: No  . Sexual activity: Yes   Other Topics Concern  . None   Social History Narrative   Married. 2 children (biological son 4, 1 step son 46 in 2017). 4 grandkids.       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 Also walks regularly. Thinking about doing bicycling on trainer.     ROS--See HPI   Objective: BP 120/78 (BP Location: Left Arm, Patient Position: Sitting, Cuff Size: Normal)   Pulse 96   Temp 98.6 F (37 C) (Oral)   Ht 5\' 1"  (1.549 m)   Wt 148 lb (67.1 kg)   SpO2 96%   BMI 27.96 kg/m  Gen: NAD, resting comfortably HEENT: Mucous membranes are moist. Oropharynx normal CV: RRR no murmurs rubs or gallops Lungs: CTAB no crackles, wheeze, rhonchi Abdomen: soft/nontender/nondistended/normal bowel sounds. No rebound or guarding.  Ext: no edema Skin: warm, dry, no rash Neuro: grossly normal, moves all extremities, PERRLA  Diabetic Foot Exam - Simple   Simple Foot Form Diabetic Foot exam was performed with the following findings:  Yes 12/22/2015 10:38 AM  Visual Inspection No deformities, no ulcerations, no other skin breakdown bilaterally:  Yes Sensation Testing Intact to touch and monofilament testing bilaterally:  Yes Pulse Check Posterior Tibialis and Dorsalis pulse intact bilaterally:  Yes Comments Wears compression stocking right leg    Assessment/Plan:  Diabetes mellitus, type 2 ([place]) S: well controlled. On metformin TID. Checked a1c through [facility] a few months ago and was 6.1- will bring records CBGs- no lows Lab Results  Component Value Date   HGBA1C 6.4 11/05/2014   HGBA1C 6.8 (H) 07/13/2014   HGBA1C 6.4 04/20/2013   A/P: well controlled, get copy of last labs then follow up 6 months   Hyperlipidemia S: well controlled on simvastatin 20mg  on last labs (labs 2016 was not compliant). No myalgias.  Lab Results  Component Value Date   CHOL 237 (H) 07/13/2014   HDL 36.40 (L) 07/13/2014   LDLCALC 91 10/28/2008   LDLDIRECT 149.0 07/13/2014   TRIG 313.0 (H) 07/13/2014   CHOLHDL 7 07/13/2014   A/P: will get records from most recent labs when was better  on compliacne than above labs   Essential hypertension S: controlled. On benazepril 10mg  BP Readings from Last 3 Encounters:  12/22/15 120/78  05/20/15 (!) 156/90  04/15/15 126/73  A/P:Continue current meds:  Doing well   Fibromyalgia Noted 2008. Stays active and deals with it- about 3x a week xanax- helps her deal with the stress of it- mainly for sleep. She has been out for some time and has struggled to sleep a few times a week- I am willing to refill and did today for sparing use    Return in about 6 months (around 06/23/2016) for follow up- or sooner if needed. Physicals with gyn Need  most recent labs from [facility] [place]- requesting Patient to get eye exam copy to [place] as well  Meds ordered this encounter  Medications  . ALPRAZolam (XANAX) 0.5 MG tablet    Sig: Take 1 tablet (0.5 mg total) by mouth at bedtime as needed.    Dispense:  30 tablet    Refill:  3  . metFORMIN (GLUCOPHAGE) 500 MG tablet    Sig: TAKE 1 TABLET BY MOUTH THREE TIMES DAILY BEFORE MEALS.    Dispense:  270 tablet    Refill:  3    Return precautions advised.   Garret Reddish, MD

## 2015-12-22 NOTE — Assessment & Plan Note (Signed)
S: controlled. On benazepril 10mg  BP Readings from Last 3 Encounters:  12/22/15 120/78  05/20/15 (!) 156/90  04/15/15 126/73  A/P:Continue current meds:  Doing well

## 2015-12-22 NOTE — Assessment & Plan Note (Signed)
S: well controlled. On metformin TID. Checked a1c through [facility] a few months ago and was 6.1- will bring records CBGs- no lows Lab Results  Component Value Date   HGBA1C 6.4 11/05/2014   HGBA1C 6.8 (H) 07/13/2014   HGBA1C 6.4 04/20/2013   A/P: well controlled, get copy of last labs then follow up 6 months

## 2015-12-22 NOTE — Patient Instructions (Addendum)
Please get me a copy of labs from [facility]  Sign release of information at the check out desk for Dr. Jola Babinski

## 2015-12-26 ENCOUNTER — Telehealth: Payer: Self-pay | Admitting: Family Medicine

## 2015-12-26 NOTE — Telephone Encounter (Signed)
Jamie for 07/21/15 can you abstract a1c of 6.1. Also total cholesterol 186, triglycerides 147, HDL 52, LDL 105.   Call patient and tell her a1c looked good but has been almost 6 months so probably should get repeat soon. Bad cholesterol was above goal of 100- let's have her work on improving diet and increasing exercise and hopeful can get this down from 105.

## 2015-12-27 ENCOUNTER — Encounter: Payer: Self-pay | Admitting: Family Medicine

## 2015-12-27 ENCOUNTER — Other Ambulatory Visit: Payer: Self-pay

## 2015-12-27 DIAGNOSIS — E119 Type 2 diabetes mellitus without complications: Secondary | ICD-10-CM

## 2015-12-27 NOTE — Telephone Encounter (Signed)
Spoke with patient and she is aware of her results. Discussed her A1C. Scheduled for lab draw on 01/06/2016.

## 2016-01-01 ENCOUNTER — Other Ambulatory Visit: Payer: Self-pay | Admitting: Internal Medicine

## 2016-01-02 NOTE — Telephone Encounter (Signed)
Rx refill sent to pharmacy. 

## 2016-01-06 ENCOUNTER — Other Ambulatory Visit: Payer: 59

## 2016-01-27 ENCOUNTER — Other Ambulatory Visit (INDEPENDENT_AMBULATORY_CARE_PROVIDER_SITE_OTHER): Payer: 59

## 2016-01-27 DIAGNOSIS — E119 Type 2 diabetes mellitus without complications: Secondary | ICD-10-CM

## 2016-01-27 LAB — HEMOGLOBIN A1C: Hgb A1c MFr Bld: 6.8 % — ABNORMAL HIGH (ref 4.6–6.5)

## 2016-04-02 ENCOUNTER — Encounter: Payer: Self-pay | Admitting: Family Medicine

## 2016-04-02 ENCOUNTER — Other Ambulatory Visit: Payer: Self-pay

## 2016-04-02 MED ORDER — METFORMIN HCL 500 MG PO TABS: ORAL_TABLET | ORAL | 3 refills | Status: DC

## 2016-04-02 MED ORDER — SIMVASTATIN 20 MG PO TABS: 20.0000 mg | ORAL_TABLET | Freq: Every day | ORAL | 3 refills | Status: DC

## 2016-04-02 MED ORDER — BENAZEPRIL HCL 10 MG PO TABS: ORAL_TABLET | ORAL | 3 refills | Status: DC

## 2016-05-04 ENCOUNTER — Ambulatory Visit: Payer: 59 | Attending: Gynecology | Admitting: Gynecology

## 2016-05-04 ENCOUNTER — Encounter: Payer: Self-pay | Admitting: Gynecology

## 2016-05-04 VITALS — BP 116/56 | HR 84 | Temp 97.8°F | Resp 16 | Ht 61.0 in | Wt 148.5 lb

## 2016-05-04 DIAGNOSIS — Z8543 Personal history of malignant neoplasm of ovary: Secondary | ICD-10-CM

## 2016-05-04 DIAGNOSIS — Z79899 Other long term (current) drug therapy: Secondary | ICD-10-CM | POA: Insufficient documentation

## 2016-05-04 DIAGNOSIS — Z8542 Personal history of malignant neoplasm of other parts of uterus: Secondary | ICD-10-CM | POA: Diagnosis not present

## 2016-05-04 DIAGNOSIS — I890 Lymphedema, not elsewhere classified: Secondary | ICD-10-CM

## 2016-05-04 DIAGNOSIS — C541 Malignant neoplasm of endometrium: Secondary | ICD-10-CM | POA: Diagnosis not present

## 2016-05-04 DIAGNOSIS — Z7984 Long term (current) use of oral hypoglycemic drugs: Secondary | ICD-10-CM | POA: Diagnosis not present

## 2016-05-04 NOTE — Patient Instructions (Signed)
Follow up in 1 year. Please call our office in Oct 2018 to make an appointment

## 2016-05-04 NOTE — Progress Notes (Signed)
Consult Note: Gyn-Onc   Jamie [name] 60 y.o. female  Chief Complaint  Patient presents with  . endometrial cancer  Assessment: Stage IA grade 1 endometrial adenocarcinoma November 2012 NED.  Stage IA ovarian cancer November 2012 NED  Lymphedema of right lower extremity (stable)  Plan:   The patient return  to see [place] in 12 months    Interval History: The patient returns today for a routine visit. Since her last visit she's done well.    She is up-to-date with mammogramsAnd colonoscopy...  She denies any constipation or any GU symptoms.  She denies any other health problems. Lymphedema is stable with compression stockings and daily massage therapy.Marland Kitchen  HPI:The patient underwent a robotic hysterectomy, bilateral salpingo-oophorectomy, and pelvic lymphadenectomy on 03/20/2011 for treatment of an endometrial carcinoma. Final pathology showed a grade 1 endometrial cancer confined to the inner half of the myometrium. In addition an adenocarcinoma was also found in the left ovary. Review of the pathology report indicates that this appears to be 2 synchronous lesions. With regard to the endometrial lesion it appears to be low risk and no additional therapy was recommended. Further, the ovarian lesion was confined to the ovarian parenchyma and there is no other evidence of metastatic disease (stage IA grade 1) . At first postoperative followup a pelvic hematoma was identified on bimanual exam and confirmed with ultrasound. The hematoma ultimately resolved with observation.She also developed lymphedema of the right leg which was managed with PT and compression stockings.   Review of Systems:10 point review of systems is negative as noted above.   Vitals: Blood pressure (!) 116/56, pulse 84, temperature 97.8 F (36.6 C), temperature source Oral, resp. rate 16, height 5\' 1"  (1.549 m), weight 148 lb 8 oz (67.4 kg), SpO2 99 %.  Physical Exam: General : The patient is a healthy woman in no acute  distress.  HEENT: normocephalic, extraoccular movements normal; neck is supple without thyromegally  Lynphnodes: Supraclavicular and inguinal nodes not enlarged  Abdomen: Soft, non-tender, no ascites, no organomegally, no masses, no hernias  Pelvic:  EGBUS: Normal female  Vagina: Normal, no lesions  Urethra and Bladder: Normal, non-tender  Cervix: Surgically absent  Uterus: Surgically absent  Bi-manual examination: Non-tender; no adenxal masses or nodularity  Rectal: normal sphincter tone, no masses, no blood  Lower extremities: No varicosities. Normal range of motion she does have mild lymphedema of the right lower extremity (especially around the ankle)  Allergies  Allergen Reactions  . Oxycodone-Acetaminophen Itching and Swelling    Tylox    Past Medical History:  Diagnosis Date  . Allergy   . Diabetes mellitus    ORAL MEDS-NO INSULIN  . Endometrial cancer ([place]) 03/2011   03/2011  . GERD (gastroesophageal reflux disease)   . Hiatal hernia   . History of cervical dysplasia   . Hyperlipidemia   . Malignant neoplasm of body of uterus ([place]) 03/21/2011   S.p hysterectomy  . Ovarian cancer ([place]) 03/2011   03/2011  . PONV (postoperative nausea and vomiting)    ONLY AFTER CERVICAL FUSION--NO PROB WITH OTHER SURGERIES  . Ulcer ([place])    history of gastritis    Past Surgical History:  Procedure Laterality Date  . ACHILLES TENDON SURGERY     MULTIPLE SURGERIES BECAUSE OF STAPH INFECTION  . CERVICAL FUSION  09/2003  . GYNECOLOGIC CRYOSURGERY  02/2003  . LUMBAR FUSION  11/1998  . LYMPH NODE DISSECTION  2012  . TOTAL VAGINAL HYSTERECTOMY  03/29/2011  Current Outpatient Prescriptions  Medication Sig Dispense Refill  . ALPRAZolam (XANAX) 0.5 MG tablet Take 1 tablet (0.5 mg total) by mouth at bedtime as needed. 30 tablet 3  . benazepril (LOTENSIN) 10 MG tablet TAKE 1 TABLET(10 MG) BY MOUTH DAILY 90 tablet 3  . Cholecalciferol (KP VITAMIN D) 2000 UNITS CAPS Take 2000 units  once daily 30 each   . metFORMIN (GLUCOPHAGE) 500 MG tablet TAKE 1 TABLET BY MOUTH THREE TIMES DAILY BEFORE MEALS. 270 tablet 3  . Multiple Vitamins-Minerals (MULTIVITAMIN WITH MINERALS) tablet Take 1 tablet by mouth daily. Not taken in past two weeks    . simvastatin (ZOCOR) 20 MG tablet Take 1 tablet (20 mg total) by mouth at bedtime. 90 tablet 3   No current facility-administered medications for this visit.     Social History   Social History  . Marital status: Married    Spouse name: N/A  . Number of children: 1  . Years of education: N/A   Occupational History  .      receptionist   Social History Main Topics  . Smoking status: Never Smoker  . Smokeless tobacco: Never Used  . Alcohol use Not on file     Comment: once every 2-3 months  . Drug use: Unknown  . Sexual activity: Yes   Other Topics Concern  . Not on file   Social History Narrative   Married. 2 children (biological son 37, 1 step son 40 in 2017). 4 grandkids.       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 Also walks regularly. Thinking about doing bicycling on trainer.     Family History  Problem Relation Age of Onset  . Diabetes Father   . Heart disease Father   . Prostate cancer Father   . Lymphoma Father   . Lung cancer Mother   . Ovarian cancer Cousin   . Uterine cancer Cousin   . Breast cancer Maternal Aunt   . Colon cancer Paternal Grandmother 42  . Hemochromatosis Maternal Grandmother   . Diabetes Paternal Grandfather   . Lung cancer Sister   . Healthy Brother       Marti Sleigh, MD 05/04/2016, 12:29 PM

## 2016-06-13 ENCOUNTER — Ambulatory Visit (INDEPENDENT_AMBULATORY_CARE_PROVIDER_SITE_OTHER): Payer: 59 | Admitting: Family Medicine

## 2016-06-13 VITALS — BP 122/84 | HR 121 | Temp 98.9°F | Ht 61.0 in | Wt 148.0 lb

## 2016-06-13 DIAGNOSIS — J111 Influenza due to unidentified influenza virus with other respiratory manifestations: Secondary | ICD-10-CM

## 2016-06-13 DIAGNOSIS — M791 Myalgia, unspecified site: Secondary | ICD-10-CM

## 2016-06-13 LAB — POCT INFLUENZA A/B
Influenza A, POC: POSITIVE — AB
Influenza B, POC: NEGATIVE

## 2016-06-13 MED ORDER — OSELTAMIVIR PHOSPHATE 75 MG PO CAPS: 75.0000 mg | ORAL_CAPSULE | Freq: Two times a day (BID) | ORAL | 0 refills | Status: DC

## 2016-06-13 NOTE — Patient Instructions (Signed)

## 2016-06-13 NOTE — Progress Notes (Signed)
Subjective:     Patient ID: Jamie [name], female   DOB: November 29, 1955, 61 y.o.   MRN: [id number]  HPI Patient seen for acute visit. Onset 2 days ago of chills, low-grade fever, right earache, headache, myalgias. Her temperature ranged between 100- 101. She had some slight nausea but no vomiting. Increased fatigue. Denies any diarrhea.  Past Medical History:  Diagnosis Date  . Allergy   . Diabetes mellitus    ORAL MEDS-NO INSULIN  . Endometrial cancer ([place]) 03/2011   03/2011  . GERD (gastroesophageal reflux disease)   . Hiatal hernia   . History of cervical dysplasia   . Hyperlipidemia   . Malignant neoplasm of body of uterus ([place]) 03/21/2011   S.p hysterectomy  . Ovarian cancer ([place]) 03/2011   03/2011  . PONV (postoperative nausea and vomiting)    ONLY AFTER CERVICAL FUSION--NO PROB WITH OTHER SURGERIES  . Ulcer ([place])    history of gastritis   Past Surgical History:  Procedure Laterality Date  . ACHILLES TENDON SURGERY     MULTIPLE SURGERIES BECAUSE OF STAPH INFECTION  . CERVICAL FUSION  09/2003  . GYNECOLOGIC CRYOSURGERY  02/2003  . LUMBAR FUSION  11/1998  . LYMPH NODE DISSECTION  2012  . TOTAL VAGINAL HYSTERECTOMY  03/29/2011    reports that she has never smoked. She has never used smokeless tobacco. Her alcohol and drug histories are not on file. family history includes Breast cancer in her maternal aunt; Colon cancer (age of onset: 28) in her paternal grandmother; Diabetes in her father and paternal grandfather; Healthy in her brother; Heart disease in her father; Hemochromatosis in her maternal grandmother; Lung cancer in her mother and sister; Lymphoma in her father; Ovarian cancer in her cousin; Prostate cancer in her father; Uterine cancer in her cousin. Allergies  Allergen Reactions  . Oxycodone-Acetaminophen Itching and Swelling    Tylox     Review of Systems  Constitutional: Positive for chills, fatigue and fever.  HENT: Positive for congestion and ear pain.    Respiratory: Positive for cough. Negative for shortness of breath.   Gastrointestinal: Negative for diarrhea and vomiting.  Neurological: Positive for headaches.       Objective:   Physical Exam  Constitutional: She appears well-developed and well-nourished.  HENT:  Right Ear: External ear normal.  Left Ear: External ear normal.  Mouth/Throat: Oropharynx is clear and moist.  Neck: Neck supple.  Cardiovascular: Regular rhythm.   Pulmonary/Chest: Effort normal and breath sounds normal. No respiratory distress. She has no wheezes. She has no rales.  Lymphadenopathy:    She has no cervical adenopathy.  Skin: No rash noted.       Assessment:     Acute viral syndrome. Positive influenza A screen    Plan:     -Tamiflu 75 mg twice a day for 5 days -Drink plenty of fluids -Continue Tylenol or Advil as needed for body aches and fever -Follow-up for any persistent or worsening symptoms  Eulas Post MD [facility]

## 2016-06-13 NOTE — Progress Notes (Signed)
Pre visit review using our clinic review tool, if applicable. No additional management support is needed unless otherwise documented below in the visit note. 

## 2016-06-18 ENCOUNTER — Encounter: Payer: Self-pay | Admitting: Family Medicine

## 2016-06-18 ENCOUNTER — Ambulatory Visit (INDEPENDENT_AMBULATORY_CARE_PROVIDER_SITE_OTHER): Payer: 59 | Admitting: Family Medicine

## 2016-06-18 ENCOUNTER — Ambulatory Visit (INDEPENDENT_AMBULATORY_CARE_PROVIDER_SITE_OTHER)
Admission: RE | Admit: 2016-06-18 | Discharge: 2016-06-18 | Disposition: A | Discharge status: Home / Self Care | Payer: 59 | Source: Ambulatory Visit | Attending: Family Medicine | Admitting: Family Medicine

## 2016-06-18 ENCOUNTER — Telehealth: Payer: Self-pay | Admitting: Family Medicine

## 2016-06-18 VITALS — BP 126/84 | HR 72 | Temp 98.8°F | Wt 147.6 lb

## 2016-06-18 DIAGNOSIS — IMO0001 Reserved for inherently not codable concepts without codable children: Secondary | ICD-10-CM

## 2016-06-18 DIAGNOSIS — J1100 Influenza due to unidentified influenza virus with unspecified type of pneumonia: Secondary | ICD-10-CM

## 2016-06-18 DIAGNOSIS — R0602 Shortness of breath: Secondary | ICD-10-CM | POA: Diagnosis not present

## 2016-06-18 DIAGNOSIS — R05 Cough: Secondary | ICD-10-CM | POA: Diagnosis not present

## 2016-06-18 MED ORDER — AZITHROMYCIN 250 MG PO TABS: ORAL_TABLET | ORAL | 0 refills | Status: DC

## 2016-06-18 MED ORDER — PREDNISONE 10 MG PO TABS: ORAL_TABLET | ORAL | 0 refills | Status: DC

## 2016-06-18 NOTE — Progress Notes (Signed)
Pre visit review using our clinic review tool, if applicable. No additional management support is needed unless otherwise documented below in the visit note. 

## 2016-06-18 NOTE — Patient Instructions (Signed)
Please go to [facility] - located [address] across the [address] from [place] - in the basement - Hours: 8:30-5:30 PM M-F. Do not need appointment.   Also, please follow up with Dr. Yong Channel as scheduled for follow up. Please drink plenty of water enough for your urine to be pale yellow or clear. You may use Tylenol or ibuprofen for fever, and  Mucinex DM can be used for cough.    Influenza, Adult Influenza, more commonly known as "the flu," is a viral infection that primarily affects the respiratory tract. The respiratory tract includes organs that help you breathe, such as the lungs, nose, and throat. The flu causes many common cold symptoms, as well as a high fever and body aches. The flu spreads easily from person to person (is contagious). Getting a flu shot (influenza vaccination) every year is the best way to prevent influenza. What are the causes? Influenza is caused by a virus. You can catch the virus by:  Breathing in droplets from an infected person's cough or sneeze.  Touching something that was recently contaminated with the virus and then touching your mouth, nose, or eyes. What increases the risk? The following factors may make you more likely to get the flu:  Not cleaning your hands frequently with soap and water or alcohol-based hand sanitizer.  Having close contact with many people during cold and flu season.  Touching your mouth, eyes, or nose without washing or sanitizing your hands first.  Not drinking enough fluids or not eating a healthy diet.  Not getting enough sleep or exercise.  Being under a high amount of stress.  Not getting a yearly (annual) flu shot. You may be at a higher risk of complications from the flu, such as a severe lung infection (pneumonia), if you:  Are over the age of 36.  Are pregnant.  Have a weakened disease-fighting system (immune system). You may have a weakened immune system if you:  Have HIV or AIDS.  Are  undergoing chemotherapy.  Aretaking medicines that reduce the activity of (suppress) the immune system.  Have a long-term (chronic) illness, such as heart disease, kidney disease, diabetes, or lung disease.  Have a liver disorder.  Are obese.  Have anemia. What are the signs or symptoms? Symptoms of this condition typically last 4-10 days and may include:  Fever.  Chills.  Headache, body aches, or muscle aches.  Sore throat.  Cough.  Runny or congested nose.  Chest discomfort and cough.  Poor appetite.  Weakness or tiredness (fatigue).  Dizziness.  Nausea or vomiting. How is this diagnosed? This condition may be diagnosed based on your medical history and a physical exam. Your health care provider may do a nose or throat swab test to confirm the diagnosis. How is this treated? If influenza is detected early, you can be treated with antiviral medicine that can reduce the length of your illness and the severity of your symptoms. This medicine may be given by mouth (orally) or through an IV tube that is inserted in one of your veins. The goal of treatment is to relieve symptoms by taking care of yourself at home. This may include taking over-the-counter medicines, drinking plenty of fluids, and adding humidity to the air in your home. In some cases, influenza goes away on its own. Severe influenza or complications from influenza may be treated in a hospital. Follow these instructions at home:  Take over-the-counter and prescription medicines only as told by your  health care provider.  Use a cool mist humidifier to add humidity to the air in your home. This can make breathing easier.  Rest as needed.  Drink enough fluid to keep your urine clear or pale yellow.  Cover your mouth and nose when you cough or sneeze.  Wash your hands with soap and water often, especially after you cough or sneeze. If soap and water are not available, use hand sanitizer.  Stay home from  work or school as told by your health care provider. Unless you are visiting your health care provider, try to avoid leaving home until your fever has been gone for 24 hours without the use of medicine.  Keep all follow-up visits as told by your health care provider. This is important. How is this prevented?  Getting an annual flu shot is the best way to avoid getting the flu. You may get the flu shot in late summer, fall, or winter. Ask your health care provider when you should get your flu shot.  Wash your hands often or use hand sanitizer often.  Avoid contact with people who are sick during cold and flu season.  Eat a healthy diet, drink plenty of fluids, get enough sleep, and exercise regularly. Contact a health care provider if:  You develop new symptoms.  You have:  Chest pain.  Diarrhea.  A fever.  Your cough gets worse.  You produce more mucus.  You feel nauseous or you vomit. Get help right away if:  You develop shortness of breath or difficulty breathing.  Your skin or nails turn a bluish color.  You have severe pain or stiffness in your neck.  You develop a sudden headache or sudden pain in your face or ear.  You cannot stop vomiting. This information is not intended to replace advice given to you by your health care provider. Make sure you discuss any questions you have with your health care provider. Document Released: 04/20/2000 Document Revised: 09/29/2015 Document Reviewed: 02/15/2015 Elsevier Interactive Patient Education  2017 [employer].

## 2016-06-18 NOTE — Telephone Encounter (Signed)
Patient Name: JEE SOPPE DOB: [birth year] Initial Comment caller states she is having trouble breathing Nurse Assessment Nurse: Markus Daft, RN, Sherre Poot Date/Time (Eastern Time): 06/18/2016 10:03:33 AM Confirm and document reason for call. If symptomatic, describe symptoms. ---Caller states she is having trouble breathing which started Saturday AM. She did have a cough, but has subsided mostly, still continues. No nasal congestion. Does the patient have any new or worsening symptoms? ---Yes Will a triage be completed? ---Yes Related visit to physician within the last 2 weeks? ---No Does the PT have any chronic conditions? (i.e. diabetes, asthma, etc.) ---Yes List chronic conditions. ---DM Is this a behavioral health or substance abuse call? ---No Guidelines Guideline Title Affirmed Question Affirmed Notes Cough - Acute Productive Difficulty breathing Final Disposition User Go to ED Now Markus Daft, RN, [place] Comments Did have some chest heaviness last night, but that is much less now, mild. Some difficulty breathing persists now. Referrals [facility] - ED Disagree/Comply: Comply

## 2016-06-18 NOTE — Progress Notes (Signed)
Subjective:    Patient ID: Jamie [name], female    DOB: [birth year], 61 y.o.   MRN: [id number]  HPI  Jamie [name] is a 61 year old female who presents today with nasal congestion and cough that have been present for one week.  She reports associated sinus pressure, nausea, low grade fever, and wheezing with mild SOB that persists today. She states temperature yesterday was 100 F but denies fever this morning however she has taken tylenol prior to visit.  Reports productive cough with creamy colored sputum, right ear pain. Denies vomiting or diarrhea. She was evaluated on 06/13/16 and tested positive for influenza A. She was provided Tamiflu and today and she will finish this medication today. No history of asthma/bronchitis.  History of endometrial/ovarian cancer and lymphedema and she is now cancer free and follows up with her oncologist every 5 years. Treatment with tylenol and ibuprofen has provided moderate benefit. No aggravating or alleviating factors noted.  Review of Systems  Constitutional: Positive for fever.  HENT: Positive for congestion, ear pain, sinus pain and sinus pressure. Negative for sore throat.   Respiratory: Positive for cough and shortness of breath. Negative for wheezing.   Cardiovascular: Negative for chest pain and palpitations.  Gastrointestinal: Positive for nausea. Negative for abdominal pain, diarrhea and vomiting.  Genitourinary: Negative for dysuria and hematuria.  Skin: Negative for rash.  Neurological: Negative for dizziness, light-headedness, numbness and headaches.   Past Medical History:  Diagnosis Date  . Allergy   . Diabetes mellitus    ORAL MEDS-NO INSULIN  . Endometrial cancer ([place]) 03/2011   03/2011  . GERD (gastroesophageal reflux disease)   . Hiatal hernia   . History of cervical dysplasia   . Hyperlipidemia   . Malignant neoplasm of body of uterus ([place]) 03/21/2011   S.p hysterectomy  . Ovarian cancer ([place]) 03/2011   03/2011  .  PONV (postoperative nausea and vomiting)    ONLY AFTER CERVICAL FUSION--NO PROB WITH OTHER SURGERIES  . Ulcer ([place])    history of gastritis     Social History   Social History  . Marital status: Married    Spouse name: N/A  . Number of children: 1  . Years of education: N/A   Occupational History  .      receptionist   Social History Main Topics  . Smoking status: Never Smoker  . Smokeless tobacco: Never Used  . Alcohol use Not on file     Comment: once every 2-3 months  . Drug use: Unknown  . Sexual activity: Yes   Other Topics Concern  . Not on file   Social History Narrative   Married. 2 children (biological son 82, 1 step son 82 in 2017). 4 grandkids.       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 Also walks regularly. Thinking about doing bicycling on trainer.     Past Surgical History:  Procedure Laterality Date  . ACHILLES TENDON SURGERY     MULTIPLE SURGERIES BECAUSE OF STAPH INFECTION  . CERVICAL FUSION  09/2003  . GYNECOLOGIC CRYOSURGERY  02/2003  . LUMBAR FUSION  11/1998  . LYMPH NODE DISSECTION  2012  . TOTAL VAGINAL HYSTERECTOMY  03/29/2011    Family History  Problem Relation Age of Onset  . Diabetes Father   . Heart disease Father   . Prostate cancer Father   .  Lymphoma Father   . Lung cancer Mother   . Ovarian cancer Cousin   . Uterine cancer Cousin   . Breast cancer Maternal Aunt   . Colon cancer Paternal Grandmother 61  . Hemochromatosis Maternal Grandmother   . Diabetes Paternal Grandfather   . Lung cancer Sister   . Healthy Brother     Allergies  Allergen Reactions  . Oxycodone-Acetaminophen Itching and Swelling    Tylox    Current Outpatient Prescriptions on File Prior to Visit  Medication Sig Dispense Refill  . ALPRAZolam (XANAX) 0.5 MG tablet Take 1 tablet (0.5 mg total) by mouth at bedtime as needed. 30 tablet 3  . benazepril  (LOTENSIN) 10 MG tablet TAKE 1 TABLET(10 MG) BY MOUTH DAILY 90 tablet 3  . Cholecalciferol (KP VITAMIN D) 2000 UNITS CAPS Take 2000 units once daily 30 each   . metFORMIN (GLUCOPHAGE) 500 MG tablet TAKE 1 TABLET BY MOUTH THREE TIMES DAILY BEFORE MEALS. 270 tablet 3  . Multiple Vitamins-Minerals (MULTIVITAMIN WITH MINERALS) tablet Take 1 tablet by mouth daily. Not taken in past two weeks    . oseltamivir (TAMIFLU) 75 MG capsule Take 1 capsule (75 mg total) by mouth 2 (two) times daily. 10 capsule 0  . simvastatin (ZOCOR) 20 MG tablet Take 1 tablet (20 mg total) by mouth at bedtime. 90 tablet 3  . triamcinolone cream (KENALOG) 0.1 %      No current facility-administered medications on file prior to visit.     BP 126/84 (BP Location: Left Arm, Patient Position: Sitting, Cuff Size: Normal)   Pulse 72   Temp 98.8 F (37.1 C) (Oral)   Wt 147 lb 9.6 oz (67 kg)   SpO2 98%   BMI 27.89 kg/m        Objective:   Physical Exam  Constitutional: She is oriented to person, place, and time. She appears well-developed and well-nourished.  HENT:  Right Ear: Tympanic membrane normal.  Left Ear: Tympanic membrane normal.  Nose: Rhinorrhea present. Right sinus exhibits no maxillary sinus tenderness and no frontal sinus tenderness. Left sinus exhibits no maxillary sinus tenderness and no frontal sinus tenderness.  Mouth/Throat: Mucous membranes are normal. No oropharyngeal exudate or posterior oropharyngeal erythema.  Eyes: Pupils are equal, round, and reactive to light. No scleral icterus.  Neck: Neck supple.  Cardiovascular: Normal rate and regular rhythm.   Pulmonary/Chest: Effort normal and breath sounds normal. She has no wheezes.  Rales auscultated in left lower lobe.  Abdominal: Soft. Bowel sounds are normal. There is no tenderness.  Lymphadenopathy:    She has no cervical adenopathy.  Neurological: She is alert and oriented to person, place, and time.  Skin: Skin is warm and dry. No rash  noted.      Assessment & Plan:  1. Influenza, pneumonia Rales auscultated in left lower lobe; no wheezing present; symptoms of cough and report of SOB are suspicious for pneumonia. Pulse oximetry is 98% and RR is 17. We discussed that this may be viral in nature but with symptoms and exam will treat with azithromycin and obtain a chest X-ray. She has an appointment with Dr. Yong Channel in 4 days. Advised her to follow up with him at that time or sooner if symptoms do not improve with treatment, worsen, or she develops an increase in cough or SOB. - azithromycin (ZITHROMAX) 250 MG tablet; Take 2 tablets at once today then one tablet daily for 4 days.  Dispense: 6 tablet; Refill: 0 - DG Chest 2  View; Future - predniSONE (DELTASONE) 10 MG tablet; Take 4 tablets once daily for 2 days, 3 tabs daily for 2 days, 2 tabs daily for 2 days, 1 tab daily for 2 days.  Dispense: 20 tablet; Refill: 0  Delano Metz, FNP-C

## 2016-06-18 NOTE — Telephone Encounter (Signed)
Spoke with pt and she states that she does not want to go to ED, she does not feel her condition warrants that level of care at this time. Offered her OV here and she agreed. Appt made with Delano Metz, NP. Pt aware. Nothing further needed at this time.

## 2016-06-22 ENCOUNTER — Encounter: Payer: Self-pay | Admitting: Family Medicine

## 2016-06-22 ENCOUNTER — Ambulatory Visit (INDEPENDENT_AMBULATORY_CARE_PROVIDER_SITE_OTHER): Payer: 59 | Admitting: Family Medicine

## 2016-06-22 VITALS — BP 124/82 | HR 72 | Temp 98.1°F | Ht 61.0 in | Wt 147.6 lb

## 2016-06-22 DIAGNOSIS — E785 Hyperlipidemia, unspecified: Secondary | ICD-10-CM

## 2016-06-22 DIAGNOSIS — E119 Type 2 diabetes mellitus without complications: Secondary | ICD-10-CM

## 2016-06-22 DIAGNOSIS — I10 Essential (primary) hypertension: Secondary | ICD-10-CM

## 2016-06-22 LAB — CBC
HCT: 39.8 % (ref 36.0–46.0)
Hemoglobin: 13.3 g/dL (ref 12.0–15.0)
MCHC: 33.5 g/dL (ref 30.0–36.0)
MCV: 84.6 fl (ref 78.0–100.0)
Platelets: 365 10*3/uL (ref 150.0–400.0)
RBC: 4.7 Mil/uL (ref 3.87–5.11)
RDW: 13.6 % (ref 11.5–15.5)
WBC: 10.7 10*3/uL — ABNORMAL HIGH (ref 4.0–10.5)

## 2016-06-22 LAB — COMPREHENSIVE METABOLIC PANEL
ALT: 18 U/L (ref 0–35)
AST: 6 U/L (ref 0–37)
Albumin: 4.2 g/dL (ref 3.5–5.2)
Alkaline Phosphatase: 62 U/L (ref 39–117)
BUN: 19 mg/dL (ref 6–23)
CO2: 28 mEq/L (ref 19–32)
Calcium: 9.6 mg/dL (ref 8.4–10.5)
Chloride: 107 mEq/L (ref 96–112)
Creatinine, Ser: 0.94 mg/dL (ref 0.40–1.20)
GFR: 64.28 mL/min (ref >60.00)
Glucose, Bld: 123 mg/dL — ABNORMAL HIGH (ref 70–99)
Potassium: 4 mEq/L (ref 3.5–5.1)
Sodium: 141 mEq/L (ref 135–145)
Total Bilirubin: 0.5 mg/dL (ref 0.2–1.2)
Total Protein: 6.1 g/dL (ref 6.0–8.3)

## 2016-06-22 LAB — LDL CHOLESTEROL, DIRECT: Direct LDL: 132 mg/dL

## 2016-06-22 LAB — HEMOGLOBIN A1C: Hgb A1c MFr Bld: 7.5 % — ABNORMAL HIGH (ref 4.6–6.5)

## 2016-06-22 MED ORDER — ALPRAZOLAM 0.5 MG PO TABS: 0.5000 mg | ORAL_TABLET | Freq: Every evening | ORAL | 2 refills | Status: DC | PRN

## 2016-06-22 NOTE — Progress Notes (Signed)
Pre visit review using our clinic review tool, if applicable. No additional management support is needed unless otherwise documented below in the visit note. 

## 2016-06-22 NOTE — Progress Notes (Signed)
Subjective:  CARMALETA ZEIN is a 61 y.o. year old very pleasant female patient who presents for/with See problem oriented charting ROS- possible low blood sugar. No chest pain or shortness of breath. No headache or blurry vision.    Past Medical History-  Patient Active Problem List   Diagnosis Date Noted  . Diabetes mellitus, type 2 ([place]) 04/25/2007    Priority: High  . Ovarian cancer ([place]) 04/18/2012    Priority: Medium  . Lymphedema of leg 08/27/2011    Priority: Medium  . Hyperlipidemia 01/27/2007    Priority: Medium  . Essential hypertension 01/27/2007    Priority: Medium  . Fibromyalgia 01/27/2007    Priority: Medium  . Abdominal pain 05/22/2012    Priority: Low  . Nonspecific elevation of levels of transaminase or lactic acid dehydrogenase (LDH) 08/03/2010    Priority: Low  . Allergic rhinitis 01/27/2007    Priority: Low  . GERD 01/27/2007    Priority: Low  . MENOPAUSE-RELATED VASOMOTOR SYMPTOMS, HOT FLASHES 01/27/2007    Priority: Low  . CHEST PAIN, ATYPICAL 01/27/2007    Priority: Low    Medications- reviewed and updated Current Outpatient Prescriptions  Medication Sig Dispense Refill  . ALPRAZolam (XANAX) 0.5 MG tablet Take 1 tablet (0.5 mg total) by mouth at bedtime as needed. 30 tablet 2  . azithromycin (ZITHROMAX) 250 MG tablet Take 2 tablets at once today then one tablet daily for 4 days. 6 tablet 0  . benazepril (LOTENSIN) 10 MG tablet TAKE 1 TABLET(10 MG) BY MOUTH DAILY 90 tablet 3  . Cholecalciferol (KP VITAMIN D) 2000 UNITS CAPS Take 2000 units once daily 30 each   . metFORMIN (GLUCOPHAGE) 500 MG tablet TAKE 1 TABLET BY MOUTH THREE TIMES DAILY BEFORE MEALS. 270 tablet 3  . Multiple Vitamins-Minerals (MULTIVITAMIN WITH MINERALS) tablet Take 1 tablet by mouth daily. Not taken in past two weeks    . predniSONE (DELTASONE) 10 MG tablet Take 4 tablets once daily for 2 days, 3 tabs daily for 2 days, 2 tabs daily for 2 days, 1 tab daily for 2 days. 20 tablet 0   . simvastatin (ZOCOR) 20 MG tablet Take 1 tablet (20 mg total) by mouth at bedtime. 90 tablet 3  . triamcinolone cream (KENALOG) 0.1 %      No current facility-administered medications for this visit.     Objective: BP 124/82 (BP Location: Left Arm, Patient Position: Sitting, Cuff Size: Normal)   Pulse 72   Temp 98.1 F (36.7 C) (Oral)   Ht 5\' 1"  (1.549 m)   Wt 147 lb 9.6 oz (67 kg)   SpO2 95%   BMI 27.89 kg/m  Gen: NAD, resting comfortably CV: RRR no murmurs rubs or gallops Lungs: occasional wheeze in lower lobes. no crackles, rhonchi.  Abdomen: soft/nontender/nondistended/normal bowel sounds. overweight Ext: no edema Skin: warm, dry, some bruising right shin that she states is improving- had a fall a while ago Neuro: normal gait  Assessment/Plan:  Diabetes mellitus, type 2 ([place]) S:  controlled. On metformin 500mg  TID. Thinks she may be having occaisonal lows Lab Results  Component Value Date   HGBA1C 6.8 (H) 01/27/2016   HGBA1C 6.1 07/21/2015   HGBA1C 6.4 11/05/2014   A/P: gave her a meter- to check to make sure not below 70 or 80 when feels like possible low. Also update a1c  Hyperlipidemia S: mild poorly controlled on simvastatin 20mg  with LDL 105. No myalgias.   A/P: update lipids today- may need to go  up to 40mg   Hypertension S: controlled on benazepril 10mg .  BP Readings from Last 3 Encounters:  06/22/16 124/82  06/18/16 126/84  06/13/16 122/84  A/P:Continue current meds:  Doing well  Refill xanax- uses mainly for sleep about 3x a week. Should last 6 months  Flu positive. 06/13/16 tamiflu started.  Also covered azithromycin a few days later. She feels like she has drastically improved. Return precautions given  Health Maintenance Due  Topic Date Due  . OPHTHALMOLOGY EXAM - sees next month 04/06/2014  . ZOSTAVAX - hold off until shingrix 03/10/2015  . MAMMOGRAM - next month, gave her my card 06/07/2016   6 months office visit as long as a1c under  7  Orders Placed This Encounter  Procedures  . Hemoglobin A1c    [place]  . LDL cholesterol, direct    [place]  . CBC    [place]  . Comprehensive metabolic panel    [place]    Meds ordered this encounter  Medications  . ALPRAZolam (XANAX) 0.5 MG tablet    Sig: Take 1 tablet (0.5 mg total) by mouth at bedtime as needed.    Dispense:  30 tablet    Refill:  2    Return precautions advised.  Garret Reddish, MD

## 2016-06-22 NOTE — Patient Instructions (Signed)
Please stop by lab before you go  No changes today  Thrilled you are finally feeling better- Thankful for Almyra Free checking in with you and helping out as well as Dr. Elease Hashimoto  Please have eye doctor and mammogram report sent to me   ______________________________________________________________________  Starting October 1st 2018, I will be transferring to our new location: [facility] (corner of [address] from [employer]) [place], [place] Phone: [phone]  I would love to have you remain my patient at this new location as long as it remains convenient for you. I am excited about the opportunity to have x-ray and sports medicine in the new building but will really miss the awesome staff and physicians at [facility]. Continue to schedule appointments at [facility] and we will automatically transfer them to the [place] location starting October 1st.  h

## 2016-06-22 NOTE — Assessment & Plan Note (Signed)
S: mild poorly controlled on simvastatin 20mg  with LDL 105. No myalgias.   A/P: update lipids today- may need to go up to 40mg 

## 2016-06-22 NOTE — Assessment & Plan Note (Signed)
S:  controlled. On metformin 500mg  TID. Thinks she may be having occaisonal lows Lab Results  Component Value Date   HGBA1C 6.8 (H) 01/27/2016   HGBA1C 6.1 07/21/2015   HGBA1C 6.4 11/05/2014   A/P: gave her a meter- to check to make sure not below 70 or 80 when feels like possible low. Also update a1c

## 2016-07-25 ENCOUNTER — Other Ambulatory Visit: Payer: Self-pay | Admitting: Obstetrics and Gynecology

## 2016-07-25 DIAGNOSIS — Z01419 Encounter for gynecological examination (general) (routine) without abnormal findings: Secondary | ICD-10-CM | POA: Diagnosis not present

## 2016-07-25 DIAGNOSIS — Z124 Encounter for screening for malignant neoplasm of cervix: Secondary | ICD-10-CM | POA: Diagnosis not present

## 2016-07-25 LAB — HM MAMMOGRAPHY

## 2016-07-27 LAB — CYTOLOGY - PAP

## 2016-08-09 ENCOUNTER — Encounter: Payer: Self-pay | Admitting: Family Medicine

## 2016-09-05 DIAGNOSIS — E119 Type 2 diabetes mellitus without complications: Secondary | ICD-10-CM | POA: Diagnosis not present

## 2016-09-05 LAB — HM DIABETES EYE EXAM

## 2017-02-25 IMAGING — DX DG CHEST 2V
2 series · 2 of 2 positions shown · non-contrast
Comparison: March 14, 2011

CLINICAL DATA: Cough with shortness of breath

EXAM:
CHEST  2 VIEW

[chest pa]
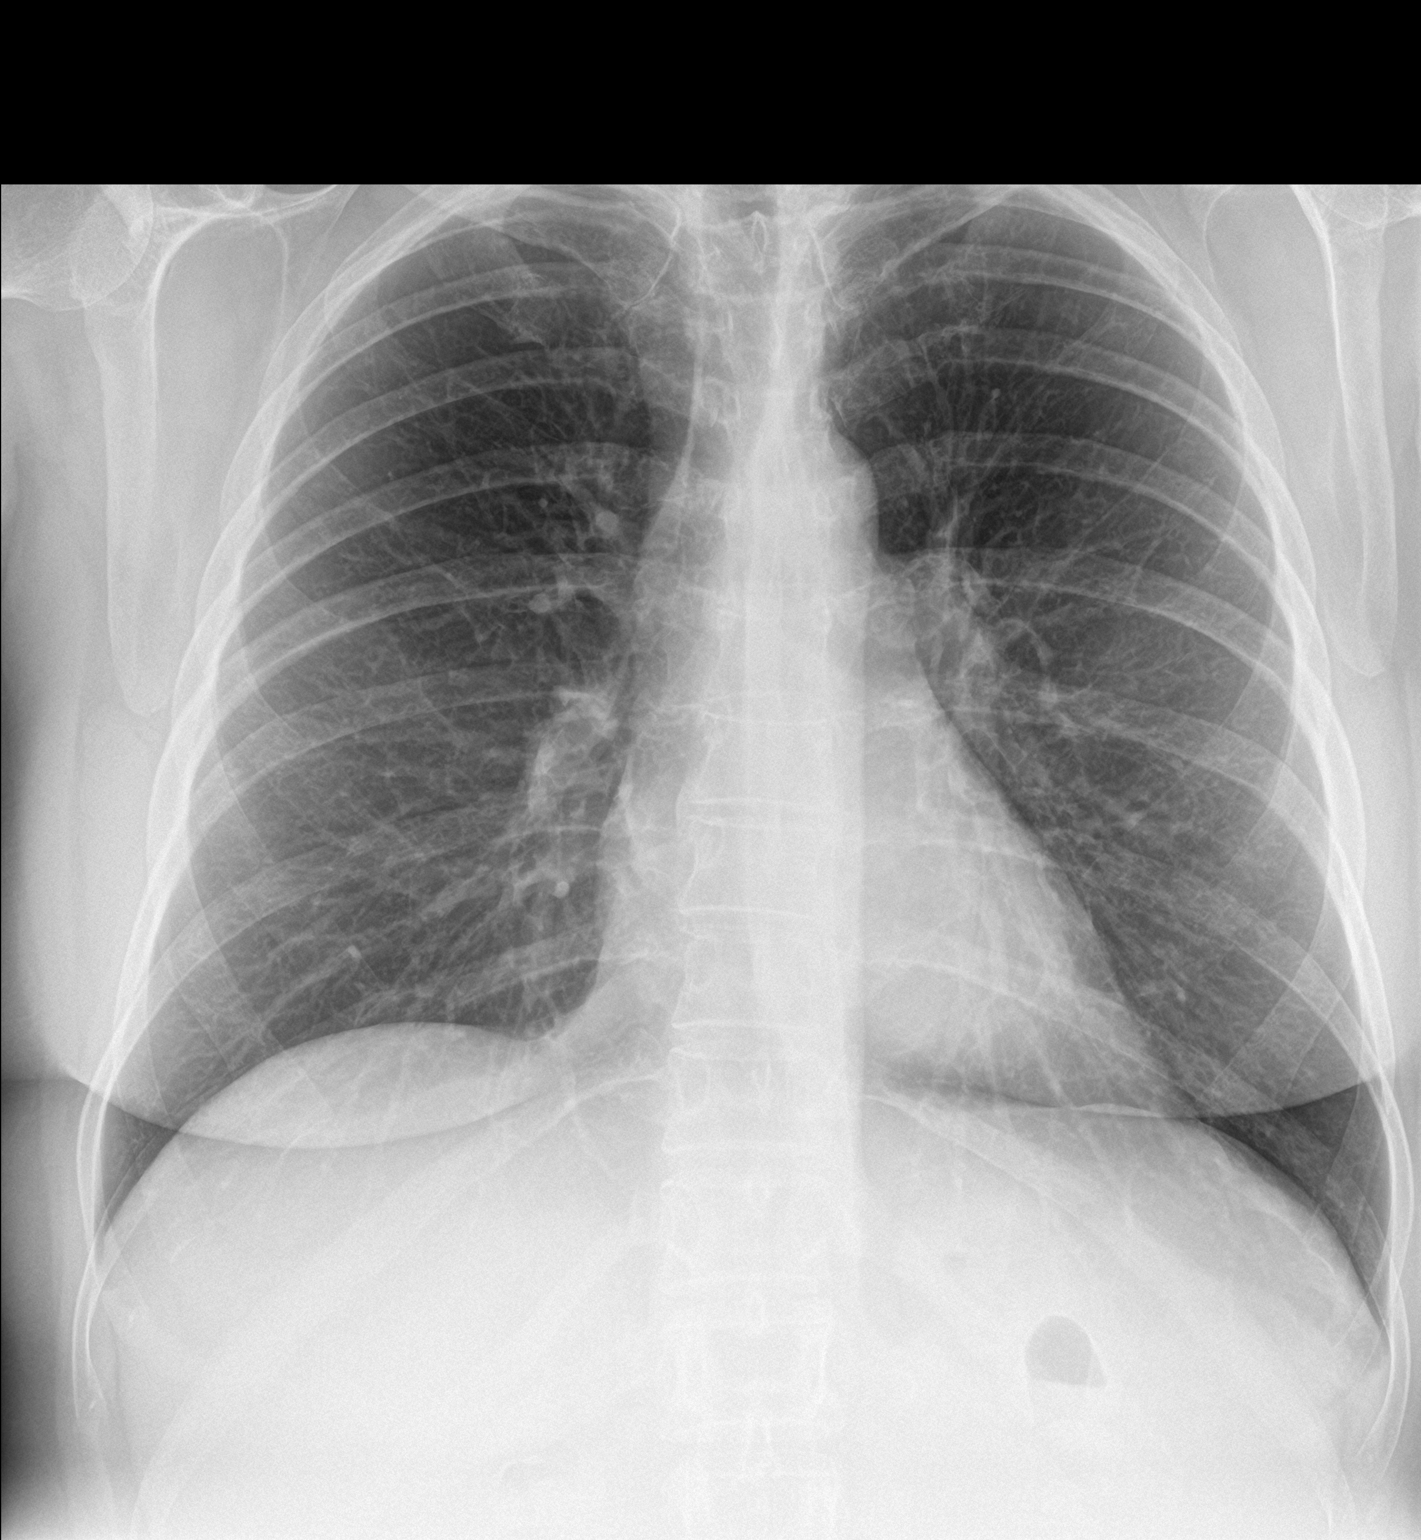

[chest lat]
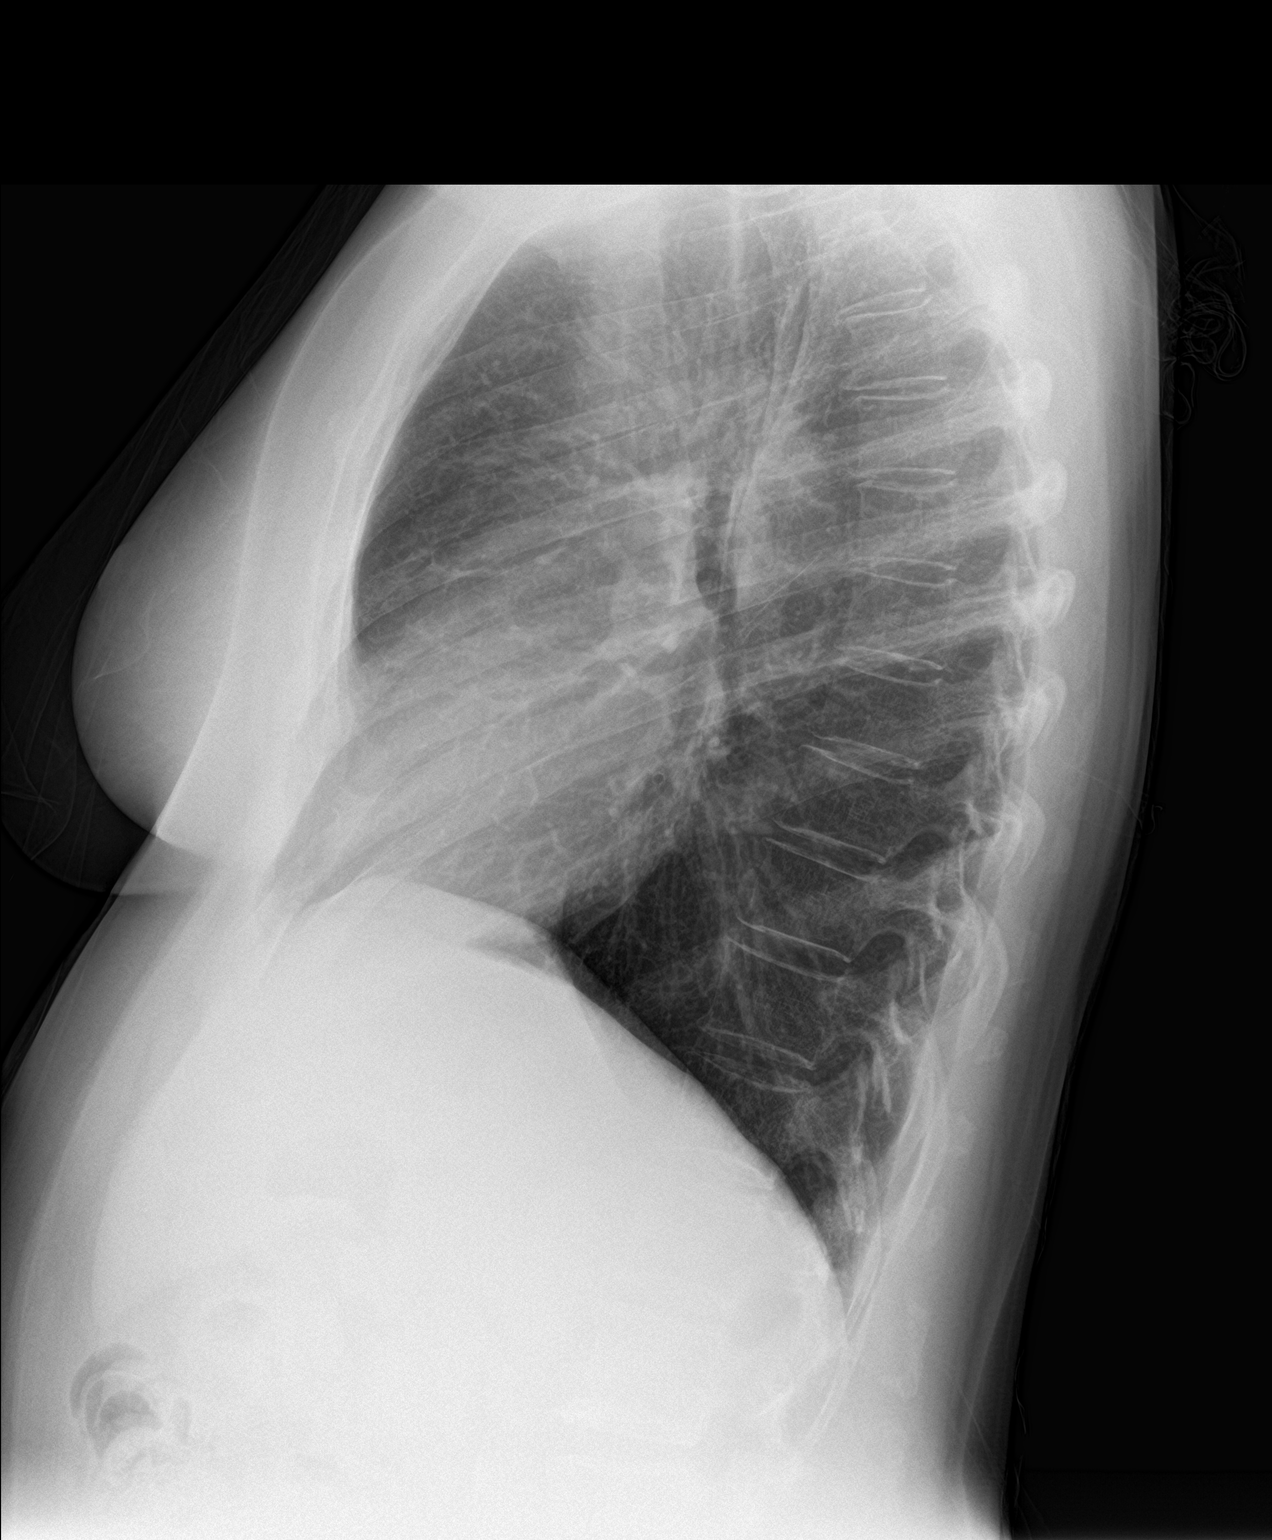

[2 of 2 positions shown; findings below may reference images not displayed]

FINDINGS: Lungs are clear. The heart size and pulmonary vascularity are
normal. No adenopathy. There is postoperative change in the lower
cervical spine.
IMPRESSION: No edema or consolidation.

## 2017-03-04 ENCOUNTER — Telehealth: Payer: Self-pay | Admitting: *Deleted

## 2017-03-04 NOTE — Telephone Encounter (Signed)
Contacted the patient and scheduled a follow up appt for December 10th at 8:45am, arrive at 8:30am

## 2017-03-13 DIAGNOSIS — N762 Acute vulvitis: Secondary | ICD-10-CM | POA: Diagnosis not present

## 2017-03-29 DIAGNOSIS — Z23 Encounter for immunization: Secondary | ICD-10-CM | POA: Diagnosis not present

## 2017-04-01 ENCOUNTER — Other Ambulatory Visit: Payer: Self-pay | Admitting: Family Medicine

## 2017-04-05 ENCOUNTER — Telehealth: Payer: Self-pay | Admitting: *Deleted

## 2017-04-05 NOTE — Telephone Encounter (Signed)
Called and moved the patient's appt on December 10th from 8:45am to 9:30am.

## 2017-04-10 ENCOUNTER — Telehealth: Payer: Self-pay | Admitting: *Deleted

## 2017-04-10 NOTE — Telephone Encounter (Signed)
Patient returned call and moved her appt on December 10th from 9:30am to 2pm

## 2017-04-10 NOTE — Telephone Encounter (Signed)
Called and left message with the patient's husband to have her call the office back regarding her appt

## 2017-04-15 ENCOUNTER — Ambulatory Visit: Payer: 59 | Admitting: Gynecology

## 2017-05-09 ENCOUNTER — Telehealth: Payer: Self-pay | Admitting: *Deleted

## 2017-05-09 NOTE — Telephone Encounter (Signed)
Patient called back and appt moved from 1/4 to 1/11

## 2017-05-09 NOTE — Telephone Encounter (Signed)
Called and left the patient a message to call the office back. Need to move appt from 1/4 to 1/11.

## 2017-05-10 ENCOUNTER — Ambulatory Visit: Payer: 59 | Admitting: Gynecology

## 2017-05-16 DIAGNOSIS — M542 Cervicalgia: Secondary | ICD-10-CM | POA: Diagnosis not present

## 2017-05-16 DIAGNOSIS — M4322 Fusion of spine, cervical region: Secondary | ICD-10-CM | POA: Diagnosis not present

## 2017-05-16 DIAGNOSIS — M791 Myalgia, unspecified site: Secondary | ICD-10-CM | POA: Diagnosis not present

## 2017-05-17 ENCOUNTER — Inpatient Hospital Stay: Payer: 59 | Attending: Gynecology | Admitting: Gynecology

## 2017-05-17 ENCOUNTER — Encounter: Payer: Self-pay | Admitting: Gynecology

## 2017-05-17 VITALS — BP 137/88 | HR 92 | Temp 98.2°F | Resp 20 | Ht 62.0 in | Wt 130.9 lb

## 2017-05-17 DIAGNOSIS — N762 Acute vulvitis: Secondary | ICD-10-CM | POA: Diagnosis not present

## 2017-05-17 DIAGNOSIS — K219 Gastro-esophageal reflux disease without esophagitis: Secondary | ICD-10-CM | POA: Diagnosis not present

## 2017-05-17 DIAGNOSIS — Z08 Encounter for follow-up examination after completed treatment for malignant neoplasm: Secondary | ICD-10-CM | POA: Insufficient documentation

## 2017-05-17 DIAGNOSIS — I890 Lymphedema, not elsewhere classified: Secondary | ICD-10-CM | POA: Diagnosis not present

## 2017-05-17 DIAGNOSIS — C541 Malignant neoplasm of endometrium: Secondary | ICD-10-CM

## 2017-05-17 DIAGNOSIS — Z7984 Long term (current) use of oral hypoglycemic drugs: Secondary | ICD-10-CM | POA: Diagnosis not present

## 2017-05-17 DIAGNOSIS — Z8543 Personal history of malignant neoplasm of ovary: Secondary | ICD-10-CM | POA: Diagnosis not present

## 2017-05-17 DIAGNOSIS — E119 Type 2 diabetes mellitus without complications: Secondary | ICD-10-CM | POA: Diagnosis not present

## 2017-05-17 DIAGNOSIS — Z90722 Acquired absence of ovaries, bilateral: Secondary | ICD-10-CM | POA: Diagnosis not present

## 2017-05-17 DIAGNOSIS — Z79899 Other long term (current) drug therapy: Secondary | ICD-10-CM

## 2017-05-17 DIAGNOSIS — Z8542 Personal history of malignant neoplasm of other parts of uterus: Secondary | ICD-10-CM | POA: Diagnosis present

## 2017-05-17 DIAGNOSIS — E785 Hyperlipidemia, unspecified: Secondary | ICD-10-CM | POA: Insufficient documentation

## 2017-05-17 DIAGNOSIS — Z90710 Acquired absence of both cervix and uterus: Secondary | ICD-10-CM | POA: Insufficient documentation

## 2017-05-17 MED ORDER — HYDROXYZINE HCL 25 MG PO TABS: 25.0000 mg | ORAL_TABLET | Freq: Every evening | ORAL | 0 refills | Status: DC | PRN

## 2017-05-17 NOTE — Progress Notes (Signed)
Consult Note: Gyn-Onc   Jamie [name] 62 y.o. female  Chief Complaint  Patient presents with  . Endometrial cancer [facility])  Assessment: Stage IA grade 1 endometrial adenocarcinoma November 2012 NED.  Stage IA ovarian cancer November 2012 NED  Lymphedema of right lower extremity (improved)  Plan:   She is given the following instructions for management of the vulvitis.     Vulvar Care Plan Goal:  reduce itching and scratching to allow damaged skin to repair.  Aveeno soothing bath treatment for 20 minutes before bed time.  Pat (don't rub) the vulva dry Apply clobetasol  0,05% ointment at bedtime and mid day No underwear Wear cotton gloves to avoid scratching Hydroxyzine 25 mgm at bedtime  Wear cotton underwearThe patient return  to see [place] in 12 months    Interval History: The patient returns today for a routine visit. Since her last visit she's done well.    She is up-to-date with mammogramsAnd colonoscopy...  She denies any constipation or any GU symptoms.  She denies any other health problems. Lymphedema is improved with compression stockings and daily massage therapy..  Over the past month or so the patient's had increasing pruritus of the vulva which has been treated to date with clobetasol.  The patient is unaware of any inciting event.  Clobetasol is resulted in some improvement but the problem remains ongoing.  She denies any vaginal discharge or bleeding.  HPI:The patient underwent a robotic hysterectomy, bilateral salpingo-oophorectomy, and pelvic lymphadenectomy on 03/20/2011 for treatment of an endometrial carcinoma. Final pathology showed a grade 1 endometrial cancer confined to the inner half of the myometrium. In addition an adenocarcinoma was also found in the left ovary. Review of the pathology report indicates that this appears to be 2 synchronous lesions. With regard to the endometrial lesion it appears to be low risk and no additional therapy was recommended.  Further, the ovarian lesion was confined to the ovarian parenchyma and there is no other evidence of metastatic disease (stage IA grade 1) . At first postoperative followup a pelvic hematoma was identified on bimanual exam and confirmed with ultrasound. The hematoma ultimately resolved with observation.She also developed lymphedema of the right leg which was managed with PT and compression stockings.   Review of Systems:10 point review of systems is negative as noted above.   Vitals: Blood pressure 137/88, pulse 92, temperature 98.2 F (36.8 C), temperature source Oral, resp. rate 20, height 5\' 2"  (1.575 m), weight 130 lb 14.4 oz (59.4 kg), SpO2 99 %.  Physical Exam: General : The patient is a healthy woman in no acute distress.  HEENT: normocephalic, extraoccular movements normal; neck is supple without thyromegally  Lynphnodes: Supraclavicular and inguinal nodes not enlarged  Abdomen: Soft, non-tender, no ascites, no organomegally, no masses, no hernias  Pelvic:  EGBUS: Normal female, there is diffuse erythema and some areas of excoriation.  There is no focal lesions. Vagina: Normal, no lesions  Urethra and Bladder: Normal, non-tender  Cervix: Surgically absent  Uterus: Surgically absent  Bi-manual examination: Non-tender; no adenxal masses or nodularity  Rectal: normal sphincter tone, no masses, no blood  Lower extremities: No varicosities. Normal range of motion she does have mild lymphedema of the right lower extremity (especially around the ankle)  Allergies  Allergen Reactions  . Oxycodone-Acetaminophen Itching and Swelling    Tylox    Past Medical History:  Diagnosis Date  . Allergy   . Diabetes mellitus    ORAL MEDS-NO INSULIN  . Endometrial  cancer [facility]) 03/2011   03/2011  . GERD (gastroesophageal reflux disease)   . Hiatal hernia   . History of cervical dysplasia   . Hyperlipidemia   . Malignant neoplasm of body of uterus ([place]) 03/21/2011   S.p hysterectomy  .  Ovarian cancer ([place]) 03/2011   03/2011  . PONV (postoperative nausea and vomiting)    ONLY AFTER CERVICAL FUSION--NO PROB WITH OTHER SURGERIES  . Ulcer    history of gastritis    Past Surgical History:  Procedure Laterality Date  . ACHILLES TENDON SURGERY     MULTIPLE SURGERIES BECAUSE OF STAPH INFECTION  . CERVICAL FUSION  09/2003  . GYNECOLOGIC CRYOSURGERY  02/2003  . LUMBAR FUSION  11/1998  . LYMPH NODE DISSECTION  2012  . TOTAL VAGINAL HYSTERECTOMY  03/29/2011    Current Outpatient Medications  Medication Sig Dispense Refill  . ALPRAZolam (XANAX) 0.5 MG tablet Take 1 tablet (0.5 mg total) by mouth at bedtime as needed. 30 tablet 2  . azithromycin (ZITHROMAX) 250 MG tablet Take 2 tablets at once today then one tablet daily for 4 days. 6 tablet 0  . benazepril (LOTENSIN) 10 MG tablet TAKE 1 TABLET(10 MG) BY MOUTH DAILY 90 tablet 3  . Cholecalciferol (KP VITAMIN D) 2000 UNITS CAPS Take 2000 units once daily 30 each   . metFORMIN (GLUCOPHAGE) 500 MG tablet TAKE 1 TABLET BY MOUTH THREE TIMES DAILY BEFORE MEALS. 270 tablet 3  . Multiple Vitamins-Minerals (MULTIVITAMIN WITH MINERALS) tablet Take 1 tablet by mouth daily. Not taken in past two weeks    . predniSONE (DELTASONE) 10 MG tablet Take 4 tablets once daily for 2 days, 3 tabs daily for 2 days, 2 tabs daily for 2 days, 1 tab daily for 2 days. 20 tablet 0  . simvastatin (ZOCOR) 20 MG tablet TAKE 1 TABLET (20 MG TOTAL) BY MOUTH AT BEDTIME. 90 tablet 3  . triamcinolone cream (KENALOG) 0.1 %      No current facility-administered medications for this visit.     Social History   Socioeconomic History  . Marital status: Married    Spouse name: Not on file  . Number of children: 1  . Years of education: Not on file  . Highest education level: Not on file  Social Needs  . Financial resource strain: Not on file  . Food insecurity - worry: Not on file  . Food insecurity - inability: Not on file  . Transportation needs - medical:  Not on file  . Transportation needs - non-medical: Not on file  Occupational History    Comment: receptionist  Tobacco Use  . Smoking status: Never Smoker  . Smokeless tobacco: Never Used  Substance and Sexual Activity  . Alcohol use: Not on file    Comment: once every 2-3 months  . Drug use: Not on file  . Sexual activity: Yes  Other Topics Concern  . Not on file  Social History Narrative   Married. 2 children (biological son 79, 1 step son 21 in 2017). 4 grandkids.       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 Also walks regularly. Thinking about doing bicycling on trainer.     Family History  Problem Relation Age of Onset  . Diabetes Father   . Heart disease Father   . Prostate cancer Father   . Lymphoma Father   . Lung cancer Mother   .  Ovarian cancer Cousin   . Uterine cancer Cousin   . Breast cancer Maternal Aunt   . Colon cancer Paternal Grandmother 15  . Hemochromatosis Maternal Grandmother   . Diabetes Paternal Grandfather   . Lung cancer Sister   . Healthy Brother       Marti Sleigh, MD 05/17/2017, 12:01 PM

## 2017-05-17 NOTE — Patient Instructions (Addendum)
Plan on following the instructions to treat vulva symptoms.  Plan on follow up with Dr. Fermin Schwab in one year or sooner.  Vulvar Care Plan Goal:  reduce itching and scratching to allow damaged skin to repair.  Aveeno soothing bath treatment for 20 minutes before bed time.  Pat (don't rub) the vulva dry Apply clobetasol  0,05% ointment at bedtime and mid day No underwear or Wear cotton underwear Wear cotton gloves to avoid scratching Hydroxyzine 25 mgm at bedtime

## 2017-07-26 DIAGNOSIS — Z1231 Encounter for screening mammogram for malignant neoplasm of breast: Secondary | ICD-10-CM | POA: Diagnosis not present

## 2017-10-29 DIAGNOSIS — E119 Type 2 diabetes mellitus without complications: Secondary | ICD-10-CM | POA: Diagnosis not present

## 2017-10-29 LAB — HM DIABETES EYE EXAM

## 2017-11-21 ENCOUNTER — Encounter: Payer: Self-pay | Admitting: Family Medicine

## 2017-12-18 DIAGNOSIS — M542 Cervicalgia: Secondary | ICD-10-CM | POA: Diagnosis not present

## 2017-12-18 DIAGNOSIS — M4322 Fusion of spine, cervical region: Secondary | ICD-10-CM | POA: Diagnosis not present

## 2017-12-18 DIAGNOSIS — M7918 Myalgia, other site: Secondary | ICD-10-CM | POA: Diagnosis not present

## 2018-01-28 DIAGNOSIS — N762 Acute vulvitis: Secondary | ICD-10-CM | POA: Diagnosis not present

## 2018-01-28 DIAGNOSIS — R102 Pelvic and perineal pain: Secondary | ICD-10-CM | POA: Diagnosis not present

## 2018-01-28 DIAGNOSIS — L259 Unspecified contact dermatitis, unspecified cause: Secondary | ICD-10-CM | POA: Diagnosis not present

## 2018-01-28 DIAGNOSIS — Z8543 Personal history of malignant neoplasm of ovary: Secondary | ICD-10-CM | POA: Diagnosis not present

## 2018-01-28 DIAGNOSIS — R350 Frequency of micturition: Secondary | ICD-10-CM | POA: Diagnosis not present

## 2018-01-29 LAB — BASIC METABOLIC PANEL
BUN: 10 (ref 4–21)
Creatinine: 0.8 (ref 0.5–1.1)
Glucose: 149
Potassium: 5.2 (ref 3.4–5.3)
Sodium: 143 (ref 137–147)

## 2018-01-29 LAB — LIPID PANEL
Cholesterol: 201 — AB (ref 0–200)
HDL: 38 (ref 35–70)
LDl/HDL Ratio: 2.9
Triglycerides: 258 — AB (ref 40–160)

## 2018-01-29 LAB — HEPATIC FUNCTION PANEL
ALT: 27 (ref 7–35)
AST: 17 (ref 13–35)

## 2018-03-21 ENCOUNTER — Other Ambulatory Visit: Payer: Self-pay | Admitting: Family Medicine

## 2018-03-27 ENCOUNTER — Encounter: Payer: Self-pay | Admitting: Family Medicine

## 2018-03-27 ENCOUNTER — Ambulatory Visit (INDEPENDENT_AMBULATORY_CARE_PROVIDER_SITE_OTHER): Payer: 59 | Admitting: Family Medicine

## 2018-03-27 VITALS — BP 134/82 | HR 85 | Temp 98.4°F | Ht 62.0 in | Wt 139.4 lb

## 2018-03-27 DIAGNOSIS — E785 Hyperlipidemia, unspecified: Secondary | ICD-10-CM

## 2018-03-27 DIAGNOSIS — E119 Type 2 diabetes mellitus without complications: Secondary | ICD-10-CM

## 2018-03-27 DIAGNOSIS — R358 Other polyuria: Secondary | ICD-10-CM

## 2018-03-27 DIAGNOSIS — Z8543 Personal history of malignant neoplasm of ovary: Secondary | ICD-10-CM | POA: Diagnosis not present

## 2018-03-27 DIAGNOSIS — Z23 Encounter for immunization: Secondary | ICD-10-CM

## 2018-03-27 DIAGNOSIS — I10 Essential (primary) hypertension: Secondary | ICD-10-CM | POA: Diagnosis not present

## 2018-03-27 DIAGNOSIS — E663 Overweight: Secondary | ICD-10-CM

## 2018-03-27 DIAGNOSIS — R3589 Other polyuria: Secondary | ICD-10-CM

## 2018-03-27 LAB — POC URINALSYSI DIPSTICK (AUTOMATED)
Bilirubin, UA: NEGATIVE
Blood, UA: NEGATIVE
Glucose, UA: POSITIVE — AB
Ketones, UA: NEGATIVE
Leukocytes, UA: NEGATIVE
Nitrite, UA: NEGATIVE
Protein, UA: NEGATIVE
Spec Grav, UA: >=1.03 — AB (ref 1.010–1.025)
Urobilinogen, UA: 0.2 E.U./dL
pH, UA: 6 (ref 5.0–8.0)

## 2018-03-27 LAB — POCT GLYCOSYLATED HEMOGLOBIN (HGB A1C): Hemoglobin A1C: 7.6 % — AB (ref 4.0–5.6)

## 2018-03-27 MED ORDER — SIMVASTATIN 40 MG PO TABS: 40.0000 mg | ORAL_TABLET | Freq: Every day | ORAL | 3 refills | Status: DC

## 2018-03-27 MED ORDER — BENAZEPRIL HCL 10 MG PO TABS: ORAL_TABLET | ORAL | 3 refills | Status: DC

## 2018-03-27 MED ORDER — METFORMIN HCL 1000 MG PO TABS: ORAL_TABLET | ORAL | 3 refills | Status: DC

## 2018-03-27 NOTE — Progress Notes (Addendum)
precharting

## 2018-03-27 NOTE — Assessment & Plan Note (Signed)
S: controlled on benazepril 10 mg BP Readings from Last 3 Encounters:  03/27/18 134/82  05/17/17 137/88  06/22/16 124/82  A/P: We discussed blood pressure goal of <140/90. Continue current meds

## 2018-03-27 NOTE — Assessment & Plan Note (Signed)
S: Poorly controlled on labs from [place] LDL 111 on simvastatin 20 mg A/P: Advised patient due to poor control that we should increase to simvastatin 40 mg- she agrees

## 2018-03-27 NOTE — Patient Instructions (Addendum)
Please stop by lab before you go- just for urine   Increase metformin to 1000mg  twice a day. Can try extended release if any issues stomach wise  Increase simvastatin to 40mg   See [place] back in 4 months for recheck  Refilled diabetes, cholesterol, blood pressure meds

## 2018-03-27 NOTE — Progress Notes (Signed)
Subjective:  Jamie [name] is a 62 y.o. year old very pleasant female patient who presents for/with See problem oriented charting ROS- Review of Systems  Constitutional: Negative for chills and fever.  HENT: Positive for ear pain. Negative for hearing loss.        Ear ache for one week  Eyes: Negative for blurred vision and double vision.  Respiratory: Negative for cough and sputum production.   Cardiovascular: Negative for chest pain and palpitations.  Gastrointestinal: Negative for nausea and vomiting.  Genitourinary: Positive for frequency. Negative for dysuria and urgency.       Evaluated by GYN  Musculoskeletal: Negative for neck pain.  Neurological: Negative for dizziness and headaches.  Endo/Heme/Allergies: Does not bruise/bleed easily.  Psychiatric/Behavioral: Negative for depression.  No chest pain or shortness of breath. No headache or blurry vision.  Past Medical History-  Patient Active Problem List   Diagnosis Date Noted  . Diabetes mellitus, type 2 ([place]) 04/25/2007    Priority: High  . Lymphedema of leg 08/27/2011    Priority: Medium  . Hyperlipidemia 01/27/2007    Priority: Medium  . Essential hypertension 01/27/2007    Priority: Medium  . Fibromyalgia 01/27/2007    Priority: Medium  . History of ovarian cancer 03/27/2018    Priority: Low  . Abdominal pain 05/22/2012    Priority: Low  . Nonspecific elevation of levels of transaminase or lactic acid dehydrogenase (LDH) 08/03/2010    Priority: Low  . Allergic rhinitis 01/27/2007    Priority: Low  . GERD 01/27/2007    Priority: Low  . MENOPAUSE-RELATED VASOMOTOR SYMPTOMS, HOT FLASHES 01/27/2007    Priority: Low  . CHEST PAIN, ATYPICAL 01/27/2007    Priority: Low    Medications- reviewed and updated Current Outpatient Medications  Medication Sig Dispense Refill  . ALPRAZolam (XANAX) 0.5 MG tablet Take 1 tablet (0.5 mg total) by mouth at bedtime as needed. 30 tablet 2  . benazepril (LOTENSIN) 10 MG  tablet Take 1 tablet by mouth daily 90 tablet 3  . metFORMIN (GLUCOPHAGE) 1000 MG tablet Take twice a day with meals 180 tablet 3  . simvastatin (ZOCOR) 40 MG tablet Take 1 tablet (40 mg total) by mouth at bedtime. 90 tablet 3  . cyclobenzaprine (FLEXERIL) 10 MG tablet cyclobenzaprine 10 mg tablet     No current facility-administered medications for this visit.     Objective: BP 134/82   Pulse 85   Temp 98.4 F (36.9 C) (Oral)   Ht 5\' 2"  (1.575 m)   Wt 139 lb 6.4 oz (63.2 kg)   SpO2 97%   BMI 25.50 kg/m  Gen: NAD, resting comfortably CV: RRR no murmurs rubs or gallops Lungs: CTAB no crackles, wheeze, rhonchi Abdomen: soft/nontender/nondistended/normal bowel sounds.  Ext: no edema Skin: warm, dry Neuro: grossly normal, moves all extremities Embedded portions of ear and knee exam in discussion below  Assessment/Plan:  Other notes: 1.Ear pain for 1 week- ear looks great. Sometimes she has issues related to her neck which she is getting shots with Dr. Patrice Paradise. Ear looks great- she will follow up with spine and scoliosis 2.  fell in august on on left knee. Had a bad cut- did finally heal up but she is sore all around the area where she fell. Varies from 0/10 to 5/10 pain. Knee is normal on exam today- other than some lateral joint line tenderness suggestive of arthritis- good ROM. Will monitor- offered SM referral but she declined for now  Diabetes mellitus,  type 2 ([place]) S:  controlled on metformin 500 mg 3 times daily in the past- she has not followed up regularly.   Exercise and diet- exercising usually 3 days a week Lab Results  Component Value Date   HGBA1C 7.6 (A) 03/27/2018   HGBA1C 7.5 (H) 06/22/2016   HGBA1C 6.8 (H) 01/27/2016   A/P: discussed with continued poor control increasing to 1g metformin BID- may do at lunch and dinner- if stomach upset could try extended release  Hyperlipidemia S: Poorly controlled on labs from [place] LDL 111 on simvastatin 20 mg A/P:  Advised patient due to poor control that we should increase to simvastatin 40 mg- she agrees   Essential hypertension S: controlled on benazepril 10 mg BP Readings from Last 3 Encounters:  03/27/18 134/82  05/17/17 137/88  06/22/16 124/82  A/P: We discussed blood pressure goal of <140/90. Continue current meds  Polyuria- does drink a lot of water but feels like still pees more than she should. Had UA with gyn- we will get another level to make sure no sugar in urine or signs of infection- get culture if needed.   Future Appointments  Date Time Provider [place]  04/15/2018 12:15 PM Marti Sleigh, MD [facility] None   Return in about 4 months (around 07/26/2018) for follow up- or sooner if needed.  Lab/Order associations: Type 2 diabetes mellitus without complication, without long-term current use of insulin ([place]) - Plan: POCT HgB A1C  Need for immunization against influenza - Plan: Flu Vaccine QUAD 36+ mos IM  Hyperlipidemia, unspecified hyperlipidemia type  Essential hypertension  History of ovarian cancer  Overweight with body mass index (BMI) 25.0-29.9  Meds ordered this encounter  Medications  . metFORMIN (GLUCOPHAGE) 1000 MG tablet    Sig: Take twice a day with meals    Dispense:  180 tablet    Refill:  3    Generic For:*GLUCOPHAGE 500MG   . simvastatin (ZOCOR) 40 MG tablet    Sig: Take 1 tablet (40 mg total) by mouth at bedtime.    Dispense:  90 tablet    Refill:  3    Generic For:*ZOCOR  20MG   . benazepril (LOTENSIN) 10 MG tablet    Sig: Take 1 tablet by mouth daily    Dispense:  90 tablet    Refill:  3    Generic VOH:[id number] 10MG     Return precautions advised.  Garret Reddish, MD

## 2018-03-27 NOTE — Addendum Note (Signed)
Addended by: Tomi Likens on: 03/27/2018 09:28 AM   Modules accepted: Orders

## 2018-03-27 NOTE — Assessment & Plan Note (Signed)
S:  controlled on metformin 500 mg 3 times daily in the past- she has not followed up regularly.   Exercise and diet- exercising usually 3 days a week Lab Results  Component Value Date   HGBA1C 7.6 (A) 03/27/2018   HGBA1C 7.5 (H) 06/22/2016   HGBA1C 6.8 (H) 01/27/2016   A/P: discussed with continued poor control increasing to 1g metformin BID- may do at lunch and dinner- if stomach upset could try extended release

## 2018-03-28 ENCOUNTER — Telehealth: Payer: Self-pay | Admitting: Family Medicine

## 2018-03-28 NOTE — Telephone Encounter (Signed)
Copied from [place] [id number]. Topic: Quick Communication - Other Results (Clinic Use ONLY) >> Mar 28, 2018  3:15 PM Williemae Area, RN wrote: Hulen Skains patient to inform them of 03/27/18 urine results. When patient returns call, triage nurse may disclose results. >> Mar 28, 2018  3:20 PM Bea Graff, NT wrote: Pt returning call to receive lab results.

## 2018-03-28 NOTE — Telephone Encounter (Signed)
Results provided to patient.  See result note.

## 2018-04-15 ENCOUNTER — Inpatient Hospital Stay: Payer: 59 | Attending: Gynecology | Admitting: Gynecology

## 2018-04-15 ENCOUNTER — Encounter: Payer: Self-pay | Admitting: Gynecology

## 2018-04-15 VITALS — BP 133/70 | HR 82 | Temp 98.3°F | Resp 16 | Ht 62.0 in | Wt 139.0 lb

## 2018-04-15 DIAGNOSIS — I890 Lymphedema, not elsewhere classified: Secondary | ICD-10-CM | POA: Diagnosis not present

## 2018-04-15 DIAGNOSIS — Z90710 Acquired absence of both cervix and uterus: Secondary | ICD-10-CM | POA: Insufficient documentation

## 2018-04-15 DIAGNOSIS — Z08 Encounter for follow-up examination after completed treatment for malignant neoplasm: Secondary | ICD-10-CM | POA: Insufficient documentation

## 2018-04-15 DIAGNOSIS — Z90722 Acquired absence of ovaries, bilateral: Secondary | ICD-10-CM | POA: Insufficient documentation

## 2018-04-15 DIAGNOSIS — C541 Malignant neoplasm of endometrium: Secondary | ICD-10-CM

## 2018-04-15 DIAGNOSIS — Z8542 Personal history of malignant neoplasm of other parts of uterus: Secondary | ICD-10-CM

## 2018-04-15 DIAGNOSIS — Z8543 Personal history of malignant neoplasm of ovary: Secondary | ICD-10-CM

## 2018-04-15 NOTE — Patient Instructions (Signed)
Plan to follow up in one year or sooner if needed.  Please call our office in August or Sept 2020 to schedule for Dec 2020. Please let us know if the abdominal bloating symptoms persist or worsen.

## 2018-04-15 NOTE — Progress Notes (Signed)
Consult Note: Gyn-Onc   Jamie [name] 62 y.o. female  Chief Complaint  Patient presents with  . Endometrial cancer [facility])  Assessment: Stage IA grade 1 endometrial adenocarcinoma November 2012 NED.  Stage IA ovarian cancer November 2012 NED  Lymphedema of right lower extremity (stable)  Plan:      The patient return  to see [place] in 12 months    Interval History: The patient returns today for a routine visit. Since her last visit she's done well.    She is up-to-date with mammograms And colonoscopy.  She denies any constipation or any GU symptoms.  She denies any other health problems. Lymphedema of the right lower extremity is stable with compression stockings and daily massage therapy.. Her prior vulvitis has resolved with the care provided by Dr. Harrington Challenger.  HPI:The patient underwent a robotic hysterectomy, bilateral salpingo-oophorectomy, and pelvic lymphadenectomy on 03/20/2011 for treatment of an endometrial carcinoma. Final pathology showed a grade 1 endometrial cancer confined to the inner half of the myometrium. In addition an adenocarcinoma was also found in the left ovary. Review of the pathology report indicates that this appears to be 2 synchronous lesions. With regard to the endometrial lesion it appears to be low risk and no additional therapy was recommended. Further, the ovarian lesion was confined to the ovarian parenchyma and there is no other evidence of metastatic disease (stage IA grade 1) . At first postoperative followup a pelvic hematoma was identified on bimanual exam and confirmed with ultrasound. The hematoma ultimately resolved with observation.She also developed lymphedema of the right leg which was managed with PT and compression stockings.   Review of Systems:10 point review of systems is negative as noted above.   Vitals: Blood pressure 133/70, pulse 82, temperature 98.3 F (36.8 C), resp. rate 16, height 5\' 2"  (1.575 m), weight 139 lb (63 kg), SpO2 100  %.  Physical Exam: General : The patient is a healthy woman in no acute distress.  HEENT: normocephalic, extraoccular movements normal; neck is supple without thyromegally  Lynphnodes: Supraclavicular and inguinal nodes not enlarged  Abdomen: Soft, non-tender, no ascites, no organomegally, no masses, no hernias  Pelvic:  EGBUS: Normal female,  Vagina: Normal, no lesions  Urethra and Bladder: Normal, non-tender  Cervix: Surgically absent  Uterus: Surgically absent  Bi-manual examination: Non-tender; no adenxal masses or nodularity  Rectal: normal sphincter tone, no masses, no blood  Lower extremities: No varicosities. Normal range of motion she does have mild lymphedema of the right lower extremity (especially around the ankle)  Allergies  Allergen Reactions  . Oxycodone-Acetaminophen Itching and Swelling    Tylox    Past Medical History:  Diagnosis Date  . Allergy   . Diabetes mellitus    ORAL MEDS-NO INSULIN  . Endometrial cancer ([place]) 03/2011   03/2011  . GERD (gastroesophageal reflux disease)   . Hiatal hernia   . History of cervical dysplasia   . Hyperlipidemia   . Malignant neoplasm of body of uterus ([place]) 03/21/2011   S.p hysterectomy  . Ovarian cancer ([place]) 03/2011   03/2011  . PONV (postoperative nausea and vomiting)    ONLY AFTER CERVICAL FUSION--NO PROB WITH OTHER SURGERIES  . Ulcer    history of gastritis    Past Surgical History:  Procedure Laterality Date  . ACHILLES TENDON SURGERY     MULTIPLE SURGERIES BECAUSE OF STAPH INFECTION  . CERVICAL FUSION  09/2003  . GYNECOLOGIC CRYOSURGERY  02/2003  . LUMBAR FUSION  11/1998  .  LYMPH NODE DISSECTION  2012  . TOTAL VAGINAL HYSTERECTOMY  03/29/2011    Current Outpatient Medications  Medication Sig Dispense Refill  . ALPRAZolam (XANAX) 0.5 MG tablet Take 1 tablet (0.5 mg total) by mouth at bedtime as needed. 30 tablet 2  . benazepril (LOTENSIN) 10 MG tablet Take 1 tablet by mouth daily 90 tablet 3  .  cyclobenzaprine (FLEXERIL) 10 MG tablet cyclobenzaprine 10 mg tablet    . metFORMIN (GLUCOPHAGE) 1000 MG tablet Take twice a day with meals 180 tablet 3  . simvastatin (ZOCOR) 40 MG tablet Take 1 tablet (40 mg total) by mouth at bedtime. 90 tablet 3   No current facility-administered medications for this visit.     Social History   Socioeconomic History  . Marital status: Married    Spouse name: Not on file  . Number of children: 1  . Years of education: Not on file  . Highest education level: Not on file  Occupational History    Comment: receptionist  Social Needs  . Financial resource strain: Not on file  . Food insecurity:    Worry: Not on file    Inability: Not on file  . Transportation needs:    Medical: Not on file    Non-medical: Not on file  Tobacco Use  . Smoking status: Never Smoker  . Smokeless tobacco: Never Used  Substance and Sexual Activity  . Alcohol use: Not on file    Comment: once every 2-3 months  . Drug use: Not on file  . Sexual activity: Yes  Lifestyle  . Physical activity:    Days per week: Not on file    Minutes per session: Not on file  . Stress: Not on file  Relationships  . Social connections:    Talks on phone: Not on file    Gets together: Not on file    Attends religious service: Not on file    Active member of club or organization: Not on file    Attends meetings of clubs or organizations: Not on file    Relationship status: Not on file  . Intimate partner violence:    Fear of current or ex partner: Not on file    Emotionally abused: Not on file    Physically abused: Not on file    Forced sexual activity: Not on file  Other Topics Concern  . Not on file  Social History Narrative   Married. 2 children (biological son 55, 1 step son 58 in 2017). 4 grandkids.       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    [place]ground in  [place]. [place], can[place].      Hobbies: roller skating- works at [employer] too. Also walks regularly. Thinking about doing bicycling on trainer.     Family History  Problem Relation Age of Onset  . Diabetes Father   . Heart disease Father   . Prostate cancer Father   . Lymphoma Father   . Lung cancer Mother   . Ovarian cancer Cousin   . Uterine cancer Cousin   . Breast cancer Maternal Aunt   . Colon cancer Paternal Grandmother 50  . Hemochromatosis Maternal Grandmother   . Diabetes Paternal Grandfather   . Lung cancer Sister   . Healthy Brother       Marti Sleigh, MD 04/15/2018, 12:13 PM

## 2018-06-30 DIAGNOSIS — M4322 Fusion of spine, cervical region: Secondary | ICD-10-CM | POA: Diagnosis not present

## 2018-06-30 DIAGNOSIS — M542 Cervicalgia: Secondary | ICD-10-CM | POA: Diagnosis not present

## 2018-06-30 DIAGNOSIS — M545 Low back pain: Secondary | ICD-10-CM | POA: Diagnosis not present

## 2018-06-30 DIAGNOSIS — M791 Myalgia, unspecified site: Secondary | ICD-10-CM | POA: Diagnosis not present

## 2018-07-28 ENCOUNTER — Ambulatory Visit: Payer: 59 | Admitting: Family Medicine

## 2018-10-01 DIAGNOSIS — Z1231 Encounter for screening mammogram for malignant neoplasm of breast: Secondary | ICD-10-CM | POA: Diagnosis not present

## 2018-10-01 LAB — HM MAMMOGRAPHY

## 2018-10-28 ENCOUNTER — Encounter: Payer: Self-pay | Admitting: Family Medicine

## 2018-10-28 ENCOUNTER — Ambulatory Visit (INDEPENDENT_AMBULATORY_CARE_PROVIDER_SITE_OTHER): Payer: 59 | Admitting: Family Medicine

## 2018-10-28 VITALS — Ht 62.0 in | Wt 131.0 lb

## 2018-10-28 DIAGNOSIS — I152 Hypertension secondary to endocrine disorders: Secondary | ICD-10-CM

## 2018-10-28 DIAGNOSIS — E1169 Type 2 diabetes mellitus with other specified complication: Secondary | ICD-10-CM | POA: Diagnosis not present

## 2018-10-28 DIAGNOSIS — E1159 Type 2 diabetes mellitus with other circulatory complications: Secondary | ICD-10-CM | POA: Diagnosis not present

## 2018-10-28 DIAGNOSIS — R21 Rash and other nonspecific skin eruption: Secondary | ICD-10-CM | POA: Insufficient documentation

## 2018-10-28 DIAGNOSIS — I10 Essential (primary) hypertension: Secondary | ICD-10-CM

## 2018-10-28 DIAGNOSIS — E119 Type 2 diabetes mellitus without complications: Secondary | ICD-10-CM

## 2018-10-28 DIAGNOSIS — E785 Hyperlipidemia, unspecified: Secondary | ICD-10-CM

## 2018-10-28 DIAGNOSIS — C541 Malignant neoplasm of endometrium: Secondary | ICD-10-CM

## 2018-10-28 MED ORDER — TRIAMCINOLONE ACETONIDE 0.1 % EX CREA: 1.0000 "application " | TOPICAL_CREAM | Freq: Two times a day (BID) | CUTANEOUS | 3 refills | Status: DC

## 2018-10-28 NOTE — Patient Instructions (Addendum)
Health Maintenance Due  Topic Date Due  . MAMMOGRAM -mammogram first of this upcoming month-  [facility].  07/26/2018

## 2018-10-28 NOTE — Progress Notes (Signed)
Phone [phone]   Subjective:  Virtual visit via Video note. Chief complaint: Chief Complaint  Patient presents with  . Diabetes   This visit type was conducted due to national recommendations for restrictions regarding the COVID-19 Pandemic (e.g. social distancing).  This format is felt to be most appropriate for this patient at this time balancing risks to patient and risks to population by having him in for in person visit.  No physical exam was performed (except for noted visual exam or audio findings with Telehealth visits).    Our team/I connected with Jamie [name] at  9:40 AM EDT by a video enabled telemedicine application (doxy.me or caregility through epic) and verified that I am speaking with the correct person using two identifiers.  Location patient: Home[place] Location provider: [facility], office Persons participating in the virtual visit:  patient  Our team/I discussed the limitations of evaluation and management by telemedicine and the availability of in person appointments. In light of current covid-19 pandemic, patient also understands that we are trying to protect them by minimizing in office contact if at all possible.  The patient expressed consent for telemedicine visit and agreed to proceed. Patient understands insurance will be billed.   ROS- still with frequent urination- continued issues. No fever, chills, new cough, congestion, runny nose, shortness of breath, fatigue, body aches, sore throat, headache, nausea, vomiting, diarrhea, or new loss of taste or smell. No known contacts with covid 19 or someone being tested for covid 19.   Past Medical History-  Patient Active Problem List   Diagnosis Date Noted  . Diabetes mellitus, type 2 ([place]) 04/25/2007    Priority: High  . Lymphedema of leg 08/27/2011    Priority: Medium  . Hyperlipidemia associated with type 2 diabetes mellitus ([place]) 01/27/2007    Priority: Medium  . Hypertension associated with diabetes  ([place]) 01/27/2007    Priority: Medium  . Fibromyalgia 01/27/2007    Priority: Medium  . Rash 10/28/2018    Priority: Low  . History of ovarian cancer 03/27/2018    Priority: Low  . Abdominal pain 05/22/2012    Priority: Low  . Nonspecific elevation of levels of transaminase or lactic acid dehydrogenase (LDH) 08/03/2010    Priority: Low  . Allergic rhinitis 01/27/2007    Priority: Low  . GERD 01/27/2007    Priority: Low  . MENOPAUSE-RELATED VASOMOTOR SYMPTOMS, HOT FLASHES 01/27/2007    Priority: Low  . CHEST PAIN, ATYPICAL 01/27/2007    Priority: Low    Medications- reviewed and updated Current Outpatient Medications  Medication Sig Dispense Refill  . ALPRAZolam (XANAX) 0.5 MG tablet Take 1 tablet (0.5 mg total) by mouth at bedtime as needed. 30 tablet 2  . benazepril (LOTENSIN) 10 MG tablet Take 1 tablet by mouth daily 90 tablet 3  . cyclobenzaprine (FLEXERIL) 10 MG tablet cyclobenzaprine 10 mg tablet    . metFORMIN (GLUCOPHAGE) 1000 MG tablet Take twice a day with meals 180 tablet 3  . simvastatin (ZOCOR) 40 MG tablet Take 1 tablet (40 mg total) by mouth at bedtime. 90 tablet 3  . triamcinolone cream (KENALOG) 0.1 % Apply 1 application topically 2 (two) times daily. For 10 days maximum and then take at least 10 day break 30 g 3   No current facility-administered medications for this visit.      Objective:  Ht 5\' 2"  (1.575 m)   Wt 131 lb (59.4 kg)   LMP  (LMP Unknown)   BMI 23.96 kg/m  self reported vitals Gen: NAD, resting comfortably Lungs: nonlabored, normal respiratory rate  Skin: appears dry, no obvious rash    Assessment and Plan   # Diabetes S: hopefully controlled on metformin 1 g BID CBGs- sugars 118s to 130s for most part. Did get 1 three hundr reading.  Exercise and diet- exercising more than 3 days a week- walks campsite on weekend- at least 4-5 days. Weight down 8 lbs. congratulated patient on her efforts for regular exercise and healthy eating.  BMI now  in normal range Lab Results  Component Value Date   HGBA1C 7.6 (A) 03/27/2018   HGBA1C 7.5 (H) 06/22/2016   HGBA1C 6.8 (H) 01/27/2016   A/P: Hopefully well-controlled-patient will call to schedule lab visit  #hyperlipidemia S: hopefully controlled on simvastatin 40 mg Lab Results  Component Value Date   CHOL 201 (A) 01/29/2018   HDL 38 01/29/2018   LDLCALC 105 07/21/2015   LDLDIRECT 132.0 06/22/2016   TRIG 258 (A) 01/29/2018   CHOLHDL 7 07/13/2014   A/P: has increased her walking and we want to see if has improved #s- will have her by for lipid panel- last panel was with work  #hypertension S: controlled on benazepril 10mg  in the past BP Readings from Last 3 Encounters:  04/15/18 133/70  03/27/18 134/82  05/17/17 137/88  A/P: Hopefully stable. Continue current medications.  She will try to check her blood pressure when she locates machine and update [place].  # still working with DR. Cohen for her neck- has ear issues that seem to relate to this as well  #triamcinolone with intermittent skin peeling on her feet- triamcinolone helps. Used from old doctor- asked for refill.  We discussed using 10 days in row maximum  # social - was able to work from home for 2 months but now back at work back to 8-5 . Runs campground on weekends.   # mammogram first of this month- [facility].  Set reminder to check on this in 45 days  #oncologist once a year for endometrial and ovarian cancer- no signs of recurrence- continue to monitor with oncology  Patient will come by for abs Lab/Order associations:   ICD-10-CM   1. Type 2 diabetes mellitus without complication, without long-term current use of insulin ([facility])  E11.9 CBC with Differential/Platelet    Comprehensive metabolic panel    Lipid panel    Hemoglobin A1c  2. Rash  R21   3. Hypertension associated with diabetes ([place])  E11.59    I10   4. Hyperlipidemia associated with type 2 diabetes mellitus ([place])  E11.69    E78.5    Meds  ordered this encounter  Medications  . triamcinolone cream (KENALOG) 0.1 %    Sig: Apply 1 application topically 2 (two) times daily. For 10 days maximum and then take at least 10 day break    Dispense:  30 g    Refill:  3   Return precautions advised.  Garret Reddish, MD

## 2018-11-17 ENCOUNTER — Other Ambulatory Visit (INDEPENDENT_AMBULATORY_CARE_PROVIDER_SITE_OTHER): Payer: 59

## 2018-11-17 ENCOUNTER — Other Ambulatory Visit: Payer: Self-pay

## 2018-11-17 DIAGNOSIS — E119 Type 2 diabetes mellitus without complications: Secondary | ICD-10-CM | POA: Diagnosis not present

## 2018-11-17 LAB — CBC WITH DIFFERENTIAL/PLATELET
Basophils Absolute: 0.1 10*3/uL (ref 0.0–0.1)
Basophils Relative: 1.1 % (ref 0.0–3.0)
Eosinophils Absolute: 0.8 10*3/uL — ABNORMAL HIGH (ref 0.0–0.7)
Eosinophils Relative: 10.1 % — ABNORMAL HIGH (ref 0.0–5.0)
HCT: 42.9 % (ref 36.0–46.0)
Hemoglobin: 14.4 g/dL (ref 12.0–15.0)
Lymphocytes Relative: 24.8 % (ref 12.0–46.0)
Lymphs Abs: 1.9 10*3/uL (ref 0.7–4.0)
MCHC: 33.4 g/dL (ref 30.0–36.0)
MCV: 88.7 fl (ref 78.0–100.0)
Monocytes Absolute: 0.4 10*3/uL (ref 0.1–1.0)
Monocytes Relative: 5.1 % (ref 3.0–12.0)
Neutro Abs: 4.6 10*3/uL (ref 1.4–7.7)
Neutrophils Relative %: 58.9 % (ref 43.0–77.0)
Platelets: 263 10*3/uL (ref 150.0–400.0)
RBC: 4.84 Mil/uL (ref 3.87–5.11)
RDW: 13.4 % (ref 11.5–15.5)
WBC: 7.8 10*3/uL (ref 4.0–10.5)

## 2018-11-17 LAB — COMPREHENSIVE METABOLIC PANEL
ALT: 17 U/L (ref 0–35)
AST: 12 U/L (ref 0–37)
Albumin: 4.1 g/dL (ref 3.5–5.2)
Alkaline Phosphatase: 90 U/L (ref 39–117)
BUN: 11 mg/dL (ref 6–23)
CO2: 25 mEq/L (ref 19–32)
Calcium: 9 mg/dL (ref 8.4–10.5)
Chloride: 105 mEq/L (ref 96–112)
Creatinine, Ser: 0.83 mg/dL (ref 0.40–1.20)
GFR: 69.28 mL/min (ref >60.00)
Glucose, Bld: 227 mg/dL — ABNORMAL HIGH (ref 70–99)
Potassium: 4.3 mEq/L (ref 3.5–5.1)
Sodium: 139 mEq/L (ref 135–145)
Total Bilirubin: 0.4 mg/dL (ref 0.2–1.2)
Total Protein: 5.8 g/dL — ABNORMAL LOW (ref 6.0–8.3)

## 2018-11-17 LAB — HEMOGLOBIN A1C: Hgb A1c MFr Bld: 9.1 % — ABNORMAL HIGH (ref 4.6–6.5)

## 2018-11-17 LAB — LIPID PANEL
Cholesterol: 187 mg/dL (ref 0–200)
HDL: 30.2 mg/dL — ABNORMAL LOW (ref >39.00)
Total CHOL/HDL Ratio: 6
Triglycerides: 443 mg/dL — ABNORMAL HIGH (ref 0.0–149.0)

## 2018-11-17 LAB — LDL CHOLESTEROL, DIRECT: Direct LDL: 109 mg/dL

## 2018-11-19 ENCOUNTER — Encounter: Payer: Self-pay | Admitting: Family Medicine

## 2018-11-19 ENCOUNTER — Telehealth: Payer: Self-pay | Admitting: Family Medicine

## 2018-11-19 MED ORDER — ALPRAZOLAM 0.5 MG PO TABS: 0.5000 mg | ORAL_TABLET | Freq: Every evening | ORAL | 2 refills | Status: DC | PRN

## 2018-11-19 MED ORDER — GLIMEPIRIDE 2 MG PO TABS: 2.0000 mg | ORAL_TABLET | Freq: Every day | ORAL | 3 refills | Status: DC

## 2018-11-19 NOTE — Telephone Encounter (Signed)
Copied from [place] [id number]. Topic: Quick Communication - Lab Results (Clinic Use ONLY) >> Nov 18, 2018 12:00 PM Marian Sorrow, LPN wrote: Called patient to inform them of 11/17/2018 lab results. When patient returns call, triage nurse may disclose results.  Please call patient at work [phone]

## 2018-11-19 NOTE — Telephone Encounter (Signed)
Called pt and advised. Pt verbalized understanding. Rx sent to [facility]. Pt says thank you to Dr. Yong Channel for his kind words.

## 2018-11-19 NOTE — Telephone Encounter (Signed)
Attempted to reach pt, no answer. Left vm to call back  

## 2018-11-19 NOTE — Telephone Encounter (Signed)
Pt returned call and she states she says she saw Dr. Ansel Bong message on mychart and that she would be responding to him.   Viewed by Vida Rigger on 11/19/2018 8:25 AM Written by Marin Olp, MD on 11/17/2018 8:35 PM

## 2018-11-24 ENCOUNTER — Other Ambulatory Visit: Payer: Self-pay

## 2018-11-24 ENCOUNTER — Encounter: Payer: Self-pay | Admitting: Family Medicine

## 2018-11-24 MED ORDER — ATORVASTATIN CALCIUM 40 MG PO TABS: 40.0000 mg | ORAL_TABLET | Freq: Every day | ORAL | 3 refills | Status: DC

## 2018-12-15 ENCOUNTER — Telehealth: Payer: Self-pay

## 2018-12-15 NOTE — Telephone Encounter (Signed)
-----   Message from Marin Olp, MD sent at 12/12/2018  7:57 AM EDT ----- Can we request mammogram only from [facility].Thanks, Garret Reddish

## 2018-12-15 NOTE — Telephone Encounter (Signed)
Spoke to receptionist at [facility] and she stated she will send over last mammogram.

## 2018-12-17 ENCOUNTER — Encounter: Payer: Self-pay | Admitting: Family Medicine

## 2019-03-02 ENCOUNTER — Other Ambulatory Visit: Payer: Self-pay | Admitting: Family Medicine

## 2019-03-04 ENCOUNTER — Other Ambulatory Visit: Payer: Self-pay | Admitting: Family Medicine

## 2019-03-06 ENCOUNTER — Encounter: Payer: Self-pay | Admitting: Family Medicine

## 2019-03-06 ENCOUNTER — Ambulatory Visit (INDEPENDENT_AMBULATORY_CARE_PROVIDER_SITE_OTHER): Payer: 59 | Admitting: Family Medicine

## 2019-03-06 ENCOUNTER — Other Ambulatory Visit: Payer: Self-pay

## 2019-03-06 VITALS — HR 107 | Temp 98.6°F | Wt 134.0 lb

## 2019-03-06 DIAGNOSIS — B029 Zoster without complications: Secondary | ICD-10-CM

## 2019-03-06 MED ORDER — VALACYCLOVIR HCL 1 G PO TABS: 1000.0000 mg | ORAL_TABLET | Freq: Three times a day (TID) | ORAL | 0 refills | Status: DC

## 2019-03-06 MED ORDER — GABAPENTIN 300 MG PO CAPS: 300.0000 mg | ORAL_CAPSULE | Freq: Three times a day (TID) | ORAL | 0 refills | Status: DC

## 2019-03-06 NOTE — Patient Instructions (Addendum)
If not getting better let us know. Stay away from anyone that is immunosuppressed until lesions are all scabbing.    Shingles  Shingles is an infection. It gives you a painful skin rash and blisters that have fluid in them. Shingles is caused by the same germ (virus) that causes chickenpox. Shingles only happens in people who:  Have had chickenpox.  Have been given a shot of medicine (vaccine) to protect against chickenpox. Shingles is rare in this group. The first symptoms of shingles may be itching, tingling, or pain in an area on your skin. A rash will show on your skin a few days or weeks later. The rash is likely to be on one side of your body. The rash usually has a shape like a belt or a band. Over time, the rash turns into fluid-filled blisters. The blisters will break open, change into scabs, and dry up. Medicines may:  Help with pain and itching.  Help you get better sooner.  Help to prevent long-term problems. Follow these instructions at home: Medicines  Take over-the-counter and prescription medicines only as told by your doctor.  Put on an anti-itch cream or numbing cream where you have a rash, blisters, or scabs. Do this as told by your doctor. Helping with itching and discomfort   Put cold, wet cloths (cold compresses) on the area of the rash or blisters as told by your doctor.  Cool baths can help you feel better. Try adding baking soda or dry oatmeal to the water to lessen itching. Do not bathe in hot water. Blister and rash care  Keep your rash covered with a loose bandage (dressing).  Wear loose clothing that does not rub on your rash.  Keep your rash and blisters clean. To do this, wash the area with mild soap and cool water as told by your doctor.  Check your rash every day for signs of infection. Check for: ? More redness, swelling, or pain. ? Fluid or blood. ? Warmth. ? Pus or a bad smell.  Do not scratch your rash. Do not pick at your blisters. To  help you to not scratch: ? Keep your fingernails clean and cut short. ? Wear gloves or mittens when you sleep, if scratching is a problem. General instructions  Rest as told by your doctor.  Keep all follow-up visits as told by your doctor. This is important.  Wash your hands often with soap and water. If soap and water are not available, use hand sanitizer. Doing this lowers your chance of getting a skin infection caused by germs (bacteria).  Your infection can cause chickenpox in people who have never had chickenpox or never got a shot of chickenpox vaccine. If you have blisters that did not change into scabs yet, try not to touch other people or be around other people, especially: ? Babies. ? Pregnant women. ? Children who have areas of red, itchy, or rough skin (eczema). ? Very old people who have transplants. ? People who have a long-term (chronic) sickness, like cancer or AIDS. Contact a doctor if:  Your pain does not get better with medicine.  Your pain does not get better after the rash heals.  You have any signs of infection in the rash area. These signs include: ? More redness, swelling, or pain around the rash. ? Fluid or blood coming from the rash. ? The rash area feeling warm to the touch. ? Pus or a bad smell coming from the rash. Get help  right away if:  The rash is on your face or nose.  You have pain in your face or pain by your eye.  You lose feeling on one side of your face.  You have trouble seeing.  You have ear pain, or you have ringing in your ear.  You have a loss of taste.  Your condition gets worse. Summary  Shingles gives you a painful skin rash and blisters that have fluid in them.  Shingles is an infection. It is caused by the same germ (virus) that causes chickenpox.  Keep your rash covered with a loose bandage (dressing). Wear loose clothing that does not rub on your rash.  If you have blisters that did not change into scabs yet, try  not to touch other people or be around people. This information is not intended to replace advice given to you by your health care provider. Make sure you discuss any questions you have with your health care provider. Document Released: 10/10/2007 Document Revised: 08/15/2018 Document Reviewed: 12/26/2016 Elsevier Patient Education  2020 [employer].

## 2019-03-06 NOTE — Progress Notes (Signed)
Patient: Jamie [name] MRN: [id number] DOB: [birth year] PCP: Marin Olp, MD     Subjective:  Chief Complaint  Patient presents with  . Tailbone Pain    HPI: The patient is a 63 y.o. female who presents today for tailbone pain x 1 week. No injury to her tailbone. She also felt like she had an abrasion and then woke up and had 2 little bitty "knots" and then yesterday had 4 knots. It hurt to sit. It did burn and was itchy. She states today it is easier to sit, but still very irritating. She has had a cough and congestion recently. No exposure to shingles that she is aware of and has never had anything like this before. Denies any fever/chills.   Review of Systems  Constitutional: Negative for chills, fatigue and fever.  HENT: Positive for congestion (improving ). Negative for ear pain, hearing loss, sinus pressure, sinus pain and sore throat.   Eyes: Negative for visual disturbance.  Respiratory: Negative for cough and shortness of breath.   Cardiovascular: Negative for chest pain, palpitations and leg swelling.  Gastrointestinal: Negative for abdominal pain, diarrhea, nausea and vomiting.  Musculoskeletal:       Tailbone pain  Skin: Positive for rash.    Allergies Patient is allergic to oxycodone-acetaminophen.  Past Medical History Patient  has a past medical history of Allergy, Diabetes mellitus, Endometrial cancer ([place]) (03/2011), GERD (gastroesophageal reflux disease), Hiatal hernia, History of cervical dysplasia, Hyperlipidemia, Malignant neoplasm of body of uterus ([place]) (03/21/2011), Ovarian cancer ([place]) (03/2011), PONV (postoperative nausea and vomiting), and Ulcer.  Surgical History Patient  has a past surgical history that includes Cervical fusion (09/2003); Lumbar fusion (11/1998); Gynecologic cryosurgery (02/2003); Achilles tendon surgery; Total vaginal hysterectomy (03/29/2011); and Lymph node dissection (2012).  Family History Pateint's family history  includes Breast cancer in her maternal aunt; Colon cancer (age of onset: 6) in her paternal grandmother; Diabetes in her father and paternal grandfather; Healthy in her brother; Heart disease in her father; Hemochromatosis in her maternal grandmother; Lung cancer in her mother and sister; Lymphoma in her father; Ovarian cancer in her cousin; Prostate cancer in her father; Uterine cancer in her cousin.  Social History Patient  reports that she has never smoked. She has never used smokeless tobacco.    Objective: Vitals:   03/06/19 0845  Pulse: (!) 107  Temp: 98.6 F (37 C)  SpO2: 96%  Weight: 134 lb (60.8 kg)    Body mass index is 24.51 kg/m.  Physical Exam Vitals signs reviewed.  Constitutional:      Appearance: Normal appearance. She is normal weight.  HENT:     Head: Normocephalic and atraumatic.  Cardiovascular:     Rate and Rhythm: Regular rhythm. Tachycardia present.     Pulses: Normal pulses.  Pulmonary:     Effort: Pulmonary effort is normal.  Skin:    Comments: Erythematous rash on inner most left gluteus that run in line up gluteal cleft onto lower back in dermatomal pattern. Some lesions have scabbed over, other are fluid filled with erythematous base. No ulceration.   Neurological:     General: No focal deficit present.     Mental Status: She is alert and oriented to person, place, and time.        Assessment/plan: 1. Herpes zoster without complication Still getting lesions. Will treat with valtrex. Recommended ibuprofen and will do gabapentin for short course as needed for nerve pain. Side effects discussed. Recommended she stay away from  immunocompromised people till lesions are scabbing and not getting new one. If tail bone pain continues, would f/u for xray as she is nervous with her cancer diagnosis, but reassured her that pain is very common before and with shingles outbreak. F/u if not getting better.   -possible covid exposure at work. colleague had a  family member test positive, but this person has been quarantine at home for past week. She was not around him. She will be at home for weekend. Do not think she needs covid tested especially if other person was negative.   PPE in place and precautions taken.   Return if symptoms worsen or fail to improve.   Orma Flaming, MD [place]  03/06/2019

## 2019-03-09 ENCOUNTER — Telehealth: Payer: Self-pay | Admitting: Family Medicine

## 2019-03-09 NOTE — Telephone Encounter (Signed)
See note  Copied from [place] [id number]. Topic: General - Other >> Mar 09, 2019 10:28 AM Leward Quan A wrote: Reason for CRM: Patient called to ask Dr Rogers Blocker if she can please write her a note out of work for an extended period of time since she is unable to sit and work comfortably and also the medication causes some drowsiness and the job require a note for absence. Per patient maybe till the end of the week she had to leave work due to the discomfort.  Please advise Ph# [phone] or [phone] would like the note sent via My Chart. Say that it is uncomfortable to sit and work due to location of the Shingles.

## 2019-03-09 NOTE — Telephone Encounter (Signed)
Sent note via mychart. Called and talked to patient to let her know that this ready for her via mychart.   Orma Flaming, MD [place]

## 2019-03-12 ENCOUNTER — Encounter: Payer: Self-pay | Admitting: Family Medicine

## 2019-03-29 ENCOUNTER — Other Ambulatory Visit: Payer: Self-pay | Admitting: Family Medicine

## 2019-05-12 DIAGNOSIS — Z8542 Personal history of malignant neoplasm of other parts of uterus: Secondary | ICD-10-CM | POA: Insufficient documentation

## 2019-05-12 NOTE — Assessment & Plan Note (Deleted)
The patient underwent a robotic hysterectomy, bilateral salpingo-oophorectomy, and pelvic lymphadenectomy on 03/20/2011 for treatment of an endometrial carcinoma. Final pathology showed a grade 1 endometrial cancer confined to the inner half of the myometrium. In addition an adenocarcinoma was also found in the left ovary. Review of the pathology report indicates that this appears to be 2 synchronous lesions. With regard to the endometrial lesion it appears to be low risk and no additional therapy was recommended

## 2019-05-12 NOTE — Progress Notes (Deleted)
Consult Note: Gyn-Onc   Jamie [name] 64 y.o. female  No chief complaint on file.   Interval History: She returns for routine annual surveillance.  She denies any vaginal bleeding, cough, pain , lethargy, weight loss, changes in bowel habits or HA.  A MMG in 5/20 showed heterogeneously dense breasts.  HPI:  Allergies  Allergen Reactions  . Oxycodone-Acetaminophen Itching and Swelling    Tylox    Past Medical History:  Diagnosis Date  . Allergy   . Diabetes mellitus    ORAL MEDS-NO INSULIN  . Endometrial cancer ([place]) 03/2011   03/2011  . GERD (gastroesophageal reflux disease)   . Hiatal hernia   . History of cervical dysplasia   . Hyperlipidemia   . Malignant neoplasm of body of uterus ([place]) 03/21/2011   S.p hysterectomy  . Ovarian cancer ([place]) 03/2011   03/2011  . PONV (postoperative nausea and vomiting)    ONLY AFTER CERVICAL FUSION--NO PROB WITH OTHER SURGERIES  . Ulcer    history of gastritis    Past Surgical History:  Procedure Laterality Date  . ACHILLES TENDON SURGERY     MULTIPLE SURGERIES BECAUSE OF STAPH INFECTION  . CERVICAL FUSION  09/2003  . GYNECOLOGIC CRYOSURGERY  02/2003  . LUMBAR FUSION  11/1998  . LYMPH NODE DISSECTION  2012  . TOTAL VAGINAL HYSTERECTOMY  03/29/2011    Current Outpatient Medications  Medication Sig Dispense Refill  . ALPRAZolam (XANAX) 0.5 MG tablet Take 1 tablet (0.5 mg total) by mouth at bedtime as needed. 30 tablet 2  . atorvastatin (LIPITOR) 40 MG tablet Take 1 tablet (40 mg total) by mouth daily. 90 tablet 3  . benazepril (LOTENSIN) 10 MG tablet TAKE 1 TABLET BY MOUTH DAILY 90 tablet 2  . cyclobenzaprine (FLEXERIL) 10 MG tablet cyclobenzaprine 10 mg tablet    . gabapentin (NEURONTIN) 300 MG capsule Take 1 capsule (300 mg total) by mouth 3 (three) times daily. 30 capsule 0  . glimepiride (AMARYL) 2 MG tablet Take 1 tablet (2 mg total) by mouth daily. 90 tablet 3  . metFORMIN (GLUCOPHAGE) 1000 MG tablet TAKE 1 TABLET BY  MOUTH TWICE A DAY WITH MEALS 180 tablet 2  . simvastatin (ZOCOR) 40 MG tablet TAKE 1 TABLET (40 MG TOTAL) BY MOUTH AT BEDTIME. 90 tablet 2  . triamcinolone cream (KENALOG) 0.1 % Apply 1 application topically 2 (two) times daily. For 10 days maximum and then take at least 10 day break 30 g 3  . valACYclovir (VALTREX) 1000 MG tablet Take 1 tablet (1,000 mg total) by mouth 3 (three) times daily. 21 tablet 0   No current facility-administered medications for this visit.    Social History   Socioeconomic History  . Marital status: Married    Spouse name: Not on file  . Number of children: 1  . Years of education: Not on file  . Highest education level: Not on file  Occupational History    Comment: receptionist  Tobacco Use  . Smoking status: Never Smoker  . Smokeless tobacco: Never Used  Substance and Sexual Activity  . Alcohol use: Not on file    Comment: once every 2-3 months  . Drug use: Not on file  . Sexual activity: Yes  Other Topics Concern  . Not on file  Social History Narrative   Married. 2 children (biological son 14, 1 step son 89 in 2017). 4 grandkids.       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 [place], can[place].      Hobbies: roller skating- works at [employer] too. Also walks regularly. Thinking about doing bicycling on trainer.    Social Determinants of Health   Financial Resource Strain:   . Difficulty of Paying Living Expenses: Not on file  Food Insecurity:   . Worried About Charity fundraiser in the Last Year: Not on file  . Ran Out of Food in the Last Year: Not on file  Transportation Needs:   . Lack of Transportation (Medical): Not on file  . Lack of Transportation (Non-Medical): Not on file  Physical Activity:   . Days of Exercise per Week: Not on file  . Minutes of Exercise per Session: Not on file   Stress:   . Feeling of Stress : Not on file  Social Connections:   . Frequency of Communication with Friends and Family: Not on file  . Frequency of Social Gatherings with Friends and Family: Not on file  . Attends Religious Services: Not on file  . Active Member of Clubs or Organizations: Not on file  . Attends Archivist Meetings: Not on file  . Marital Status: Not on file  Intimate Partner Violence:   . Fear of Current or Ex-Partner: Not on file  . Emotionally Abused: Not on file  . Physically Abused: Not on file  . Sexually Abused: Not on file    Family History  Problem Relation Age of Onset  . Diabetes Father   . Heart disease Father   . Prostate cancer Father   . Lymphoma Father   . Lung cancer Mother   . Ovarian cancer Cousin   . Uterine cancer Cousin   . Breast cancer Maternal Aunt   . Colon cancer Paternal Grandmother 3  . Hemochromatosis Maternal Grandmother   . Diabetes Paternal Grandfather   . Lung cancer Sister   . Healthy Brother     Review of Systems:10 point review of systems is negative as noted above.   Vitals: There were no vitals taken for this visit.  Physical Exam: General : The patient is a healthy woman in no acute distress.  HEENT: normocephalic, extraoccular movements normal; neck is supple without thyromegally  Lynphnodes: Supraclavicular and inguinal nodes not enlarged  Abdomen: Soft, non-tender, no ascites, no organomegally, no masses, no hernias  Pelvic:  EGBUS: Normal female  Vagina: Normal, no lesions  Urethra and Bladder: Normal, non-tender  Cervix: Surgically absent  Uterus: Surgically absent  Bi-manual examination: Non-tender; no adenxal masses or nodularity  Rectal: normal sphincter tone, no masses, no blood  Lower extremities: No edema or varicosities. Normal range of motion   Assessment/Plan:  Problem List Items Addressed This Visit    History of ovarian cancer    The ovarian lesion was confined to the ovarian  parenchyma and there is no other evidence of metastatic disease (stage IA grade 1)       History of endometrial cancer    The patient underwent a robotic hysterectomy, bilateral salpingo-oophorectomy, and pelvic lymphadenectomy on 03/20/2011 for treatment of an endometrial carcinoma. Final pathology showed a grade 1 endometrial cancer confined to the inner half of the myometrium. In addition an adenocarcinoma was also found in the left ovary. Review of the pathology report indicates that this appears to be 2 synchronous lesions. With regard to the endometrial lesion  it appears to be low risk and no additional therapy was recommended       Negative symptom review and exam Lahoma Crocker, MD 05/12/2019, 11:46 PM

## 2019-05-12 NOTE — Assessment & Plan Note (Deleted)
The ovarian lesion was confined to the ovarian parenchyma and there is no other evidence of metastatic disease (stage IA grade 1)

## 2019-05-13 ENCOUNTER — Inpatient Hospital Stay: Payer: 59 | Admitting: Obstetrics & Gynecology

## 2019-05-13 ENCOUNTER — Telehealth: Payer: Self-pay | Admitting: *Deleted

## 2019-05-13 NOTE — Assessment & Plan Note (Deleted)
The patient underwent a robotic hysterectomy, bilateral salpingo-oophorectomy, and pelvic lymphadenectomy on 03/20/2011 for treatment of eventual synchronous endometrial and ovarian malignancies.  Her postoperative course was complicated by a pelvic hematoma that was identified on bimanual exam and confirmed with ultrasound. The hematoma ultimately resolved with observation.She also developed lymphedema of the right leg which was managed with PT and compression stockings.

## 2019-05-13 NOTE — Telephone Encounter (Signed)
Late entry-----Patient called and canceled appt for 1/6. Patient stated she would call back to reschedule. Per patient request, I have called and confirmed that we received the message

## 2019-06-10 ENCOUNTER — Telehealth: Payer: Self-pay

## 2019-06-10 NOTE — Telephone Encounter (Signed)
ENCOUNTER OPENED IN ERROR

## 2019-06-11 ENCOUNTER — Other Ambulatory Visit: Payer: Self-pay

## 2019-06-11 ENCOUNTER — Inpatient Hospital Stay: Payer: 59 | Attending: Gynecologic Oncology | Admitting: Gynecologic Oncology

## 2019-06-11 ENCOUNTER — Encounter: Payer: Self-pay | Admitting: Gynecologic Oncology

## 2019-06-11 VITALS — BP 145/87 | HR 80 | Temp 98.3°F | Resp 17 | Ht 62.0 in | Wt 136.2 lb

## 2019-06-11 DIAGNOSIS — K219 Gastro-esophageal reflux disease without esophagitis: Secondary | ICD-10-CM | POA: Insufficient documentation

## 2019-06-11 DIAGNOSIS — E785 Hyperlipidemia, unspecified: Secondary | ICD-10-CM | POA: Diagnosis not present

## 2019-06-11 DIAGNOSIS — C562 Malignant neoplasm of left ovary: Secondary | ICD-10-CM

## 2019-06-11 DIAGNOSIS — R634 Abnormal weight loss: Secondary | ICD-10-CM | POA: Insufficient documentation

## 2019-06-11 DIAGNOSIS — Z7984 Long term (current) use of oral hypoglycemic drugs: Secondary | ICD-10-CM | POA: Insufficient documentation

## 2019-06-11 DIAGNOSIS — R6881 Early satiety: Secondary | ICD-10-CM | POA: Diagnosis not present

## 2019-06-11 DIAGNOSIS — Z90722 Acquired absence of ovaries, bilateral: Secondary | ICD-10-CM

## 2019-06-11 DIAGNOSIS — R590 Localized enlarged lymph nodes: Secondary | ICD-10-CM | POA: Diagnosis not present

## 2019-06-11 DIAGNOSIS — Z79899 Other long term (current) drug therapy: Secondary | ICD-10-CM | POA: Diagnosis not present

## 2019-06-11 DIAGNOSIS — R140 Abdominal distension (gaseous): Secondary | ICD-10-CM | POA: Diagnosis not present

## 2019-06-11 DIAGNOSIS — Z90710 Acquired absence of both cervix and uterus: Secondary | ICD-10-CM | POA: Diagnosis not present

## 2019-06-11 DIAGNOSIS — C541 Malignant neoplasm of endometrium: Secondary | ICD-10-CM | POA: Diagnosis not present

## 2019-06-11 DIAGNOSIS — I890 Lymphedema, not elsewhere classified: Secondary | ICD-10-CM | POA: Insufficient documentation

## 2019-06-11 DIAGNOSIS — E119 Type 2 diabetes mellitus without complications: Secondary | ICD-10-CM | POA: Insufficient documentation

## 2019-06-11 NOTE — Patient Instructions (Signed)
Please call the office in one to two weeks if the abdominal bloating and feeling full quicker continues so we can order a CT scan of the abdomen and pelvis. If symptoms improve, plan to follow up in one year or sooner if needed.  Please call [phone] closer to the date to schedule your appointment.  It was very nice seeing you today.

## 2019-06-11 NOTE — Progress Notes (Signed)
Follow Up Note: Gyn-Onc  Jamie [name] 64 y.o. female  Reason for Visit: Stage IA Endometrial Cancer, Stage IA Ovarian Cancer Surveillance  HPI: Jamie [name] is a 64 year old female who was initially referred in 2012 for a grade 1 endometrioid adenocarcinoma.  On 03/20/2011, she underwent a total robotic hysterectomy bilateral salpingo-oophorectomy right pelvic lymph node dissection with Dr. Nancy Marus. Her post-operative course included a pelvic hematoma that resolved with observation.  Final pathology revealed a grade 1 endometrial cancer confined to the inner half of the myometrium and a grade 1 endometrioid adenocarcinoma found in the left ovary which was confined to the ovarian parenchyma. These were felt to be two synchronous lesions.  No adjuvant therapy was recommended. Lymphedema developed post-operatively with management from PT and compressive garments.  Interval History: She presents today for continued follow up. She has been doing well since her last visit. She reports an episode of shingles in her sacral area at the end of 2020.  For the past month she has experienced early satiety and intermittent abdominal bloating. Has lost weight intentionally.  No abdominal pain reported.  No vaginal bleeding reported. Right lower extremity lymphedema stable. She had two enlarged cervical lymph nodes on the right that decreased in size over time. She has been monitoring her blood sugar every other day and it was last She plans on retiring at the end of the year.  No concerns voiced.       Review of Systems  Constitutional: Feels well. Positive for early satiety.  Intentional weight loss.  Cardiovascular: No chest pain, shortness of breath, or edema.  Pulmonary: No cough or wheeze.  Gastrointestinal: Intermittent diarrhea without using Miralax.  Abdominal bloating. No nausea, vomiting. No bright red blood per rectum. Genitourinary: Positive for frequency but no dysuria. No urgency, or  dysuria. No vaginal bleeding or discharge.  Musculoskeletal: No myalgia or joint pain. Neurologic: No weakness, numbness, or change in gait.  Psychology: No depression, anxiety, or insomnia.  Health Maintenance: Mammogram: Last Oct 01, 2018 Pap Smear: 07/2016 (negative with high risk HPV neg) Colonoscopy: 05/20/2015 A1C: 9.1 on 11/17/2018 Lipid Panel: 11/17/2018  Current Meds:  Outpatient Encounter Medications as of 06/11/2019  Medication Sig  . ALPRAZolam (XANAX) 0.5 MG tablet Take 1 tablet (0.5 mg total) by mouth at bedtime as needed.  Marland Kitchen atorvastatin (LIPITOR) 40 MG tablet Take 1 tablet (40 mg total) by mouth daily.  . benazepril (LOTENSIN) 10 MG tablet TAKE 1 TABLET BY MOUTH DAILY  . cyclobenzaprine (FLEXERIL) 10 MG tablet cyclobenzaprine 10 mg tablet  . glimepiride (AMARYL) 2 MG tablet Take 1 tablet (2 mg total) by mouth daily.  . metFORMIN (GLUCOPHAGE) 1000 MG tablet TAKE 1 TABLET BY MOUTH TWICE A DAY WITH MEALS  . simvastatin (ZOCOR) 40 MG tablet TAKE 1 TABLET (40 MG TOTAL) BY MOUTH AT BEDTIME.  Marland Kitchen triamcinolone cream (KENALOG) 0.1 % Apply 1 application topically 2 (two) times daily. For 10 days maximum and then take at least 10 day break  . [DISCONTINUED] gabapentin (NEURONTIN) 300 MG capsule Take 1 capsule (300 mg total) by mouth 3 (three) times daily. (Patient not taking: Reported on 06/11/2019)  . [DISCONTINUED] valACYclovir (VALTREX) 1000 MG tablet Take 1 tablet (1,000 mg total) by mouth 3 (three) times daily. (Patient not taking: Reported on 06/11/2019)   No facility-administered encounter medications on file as of 06/11/2019.    Allergy:  Allergies  Allergen Reactions  . Oxycodone-Acetaminophen Itching and Swelling    Tylox  Social Hx:   Social History   Socioeconomic History  . Marital status: Married    Spouse name: Not on file  . Number of children: 1  . Years of education: Not on file  . Highest education level: Not on file  Occupational History    Comment:  receptionist  Tobacco Use  . Smoking status: Never Smoker  . Smokeless tobacco: Never Used  Substance and Sexual Activity  . Alcohol use: Not on file    Comment: once every 2-3 months  . Drug use: Not on file  . Sexual activity: Yes  Other Topics Concern  . Not on file  Social History Narrative   Married. 2 children (biological son 68, 1 step son 17 in 2017). 4 grandkids.       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 [place], canoes.      Hobbies: roller skating- works at [employer] too. Also walks regularly. Thinking about doing bicycling on trainer.    Social Determinants of Health   Financial Resource Strain:   . Difficulty of Paying Living Expenses: Not on file  Food Insecurity:   . Worried About Charity fundraiser in the Last Year: Not on file  . Ran Out of Food in the Last Year: Not on file  Transportation Needs:   . Lack of Transportation (Medical): Not on file  . Lack of Transportation (Non-Medical): Not on file  Physical Activity:   . Days of Exercise per Week: Not on file  . Minutes of Exercise per Session: Not on file  Stress:   . Feeling of Stress : Not on file  Social Connections:   . Frequency of Communication with Friends and Family: Not on file  . Frequency of Social Gatherings with Friends and Family: Not on file  . Attends Religious Services: Not on file  . Active Member of Clubs or Organizations: Not on file  . Attends Archivist Meetings: Not on file  . Marital Status: Not on file  Intimate Partner Violence:   . Fear of Current or Ex-Partner: Not on file  . Emotionally Abused: Not on file  . Physically Abused: Not on file  . Sexually Abused: Not on file    Past Surgical Hx:  Past Surgical History:  Procedure Laterality Date  . ACHILLES TENDON SURGERY     MULTIPLE SURGERIES BECAUSE OF STAPH  INFECTION  . CERVICAL FUSION  09/2003  . GYNECOLOGIC CRYOSURGERY  02/2003  . LUMBAR FUSION  11/1998  . LYMPH NODE DISSECTION  2012  . TOTAL VAGINAL HYSTERECTOMY  03/29/2011    Past Medical Hx:  Past Medical History:  Diagnosis Date  . Allergy   . Diabetes mellitus    ORAL MEDS-NO INSULIN  . Endometrial cancer ([place]) 03/2011   03/2011  . GERD (gastroesophageal reflux disease)   . Hiatal hernia   . History of cervical dysplasia   . Hyperlipidemia   . Malignant neoplasm of body of uterus ([place]) 03/21/2011   S.p hysterectomy  . Ovarian cancer ([place]) 03/2011   03/2011  . PONV (postoperative nausea and vomiting)    ONLY AFTER CERVICAL FUSION--NO PROB WITH OTHER SURGERIES  . Ulcer    history of gastritis    Family Hx:  Family History  Problem Relation Age of Onset  . Diabetes Father   .  Heart disease Father   . Prostate cancer Father   . Lymphoma Father   . Lung cancer Mother   . Ovarian cancer Cousin   . Uterine cancer Cousin   . Breast cancer Maternal Aunt   . Colon cancer Paternal Grandmother 46  . Hemochromatosis Maternal Grandmother   . Diabetes Paternal Grandfather   . Lung cancer Sister   . Healthy Brother     Vitals:  Blood pressure (!) 145/87, pulse 80, temperature 98.3 F (36.8 C), temperature source Temporal, resp. rate 17, height 5\' 2"  (1.575 m), weight 136 lb 3.2 oz (61.8 kg), SpO2 100 %.  Physical Exam: General: Well developed, well nourished female in no acute distress. Alert and oriented x 3.  Neck: Supple without any enlargements.  Lymph node survey: Slightly enlarged, palpable right posterior cervical lymph nodes x 2. Non-tender, mobile. No supraclavicular or inguinal adenopathy.  Cardiovascular: Regular rate and rhythm. S1 and S2 normal.  Lungs: Clear to auscultation bilaterally. No wheezes/crackles/rhonchi noted.  Skin: No rashes or lesions present. Back: No CVA tenderness.  Abdomen: Abdomen soft, non-tender and non-obese. Active bowel sounds in all  quadrants. No evidence of a fluid wave or abdominal masses. Lap sites to the abdomen well healed with no nodularity or evidence of herniation. Genitourinary:    Vulva/vagina: Normal external female genitalia. No lesions.    Urethra: No lesions or masses    Vagina: No palpable masses or lesions. No vaginal bleeding or drainage noted.  Rectal: Deferred per patient.  Extremities: No bilateral cyanosis or clubbing.   Assessment/Plan: 64 year old female with a history of Stage IA endometrial cancer and Stage IA left ovarian cancer diagnosed in 03/2011 s/p total robotic hysterectomy bilateral salpingo-oophorectomy right pelvic lymph node dissection. Given her symptoms of abdominal distention and early satiety for the past month, a CT scan of the abdomen and pelvis was discussed to rule out recurrence (low likelihood of this given she will be 9 years from diagnosis in the fall of this year).  She would like to wait at this time and will contact the office if her symptoms persist over the next one to two weeks. If her symptoms resolve, the recommendation will be for follow up in one year or sooner if issues arise.  She will contact the office when she will be ordering new compression stockings for an order to be faxed to the location of her choosing.  All questions answered.  Advised to call for any needs, concerns, or questions.     Dorothyann Gibbs, NP 06/11/2019, 2:49 PM

## 2019-06-23 ENCOUNTER — Other Ambulatory Visit: Payer: Self-pay

## 2019-06-23 ENCOUNTER — Ambulatory Visit (HOSPITAL_COMMUNITY)
Admission: EM | Admit: 2019-06-23 | Discharge: 2019-06-23 | Disposition: A | Discharge status: Home / Self Care | Payer: 59 | Attending: Family Medicine | Admitting: Family Medicine

## 2019-06-23 ENCOUNTER — Encounter (HOSPITAL_COMMUNITY): Payer: Self-pay | Admitting: Emergency Medicine

## 2019-06-23 DIAGNOSIS — Z7901 Long term (current) use of anticoagulants: Secondary | ICD-10-CM | POA: Insufficient documentation

## 2019-06-23 DIAGNOSIS — Z20822 Contact with and (suspected) exposure to covid-19: Secondary | ICD-10-CM | POA: Insufficient documentation

## 2019-06-23 DIAGNOSIS — I10 Essential (primary) hypertension: Secondary | ICD-10-CM | POA: Insufficient documentation

## 2019-06-23 DIAGNOSIS — M797 Fibromyalgia: Secondary | ICD-10-CM | POA: Diagnosis not present

## 2019-06-23 DIAGNOSIS — R6884 Jaw pain: Secondary | ICD-10-CM | POA: Insufficient documentation

## 2019-06-23 DIAGNOSIS — Z7984 Long term (current) use of oral hypoglycemic drugs: Secondary | ICD-10-CM | POA: Diagnosis not present

## 2019-06-23 DIAGNOSIS — E785 Hyperlipidemia, unspecified: Secondary | ICD-10-CM | POA: Diagnosis not present

## 2019-06-23 DIAGNOSIS — R05 Cough: Secondary | ICD-10-CM | POA: Insufficient documentation

## 2019-06-23 DIAGNOSIS — R059 Cough, unspecified: Secondary | ICD-10-CM

## 2019-06-23 DIAGNOSIS — R109 Unspecified abdominal pain: Secondary | ICD-10-CM | POA: Diagnosis not present

## 2019-06-23 DIAGNOSIS — R000 Tachycardia, unspecified: Secondary | ICD-10-CM | POA: Insufficient documentation

## 2019-06-23 DIAGNOSIS — R079 Chest pain, unspecified: Secondary | ICD-10-CM | POA: Diagnosis not present

## 2019-06-23 DIAGNOSIS — E1159 Type 2 diabetes mellitus with other circulatory complications: Secondary | ICD-10-CM | POA: Insufficient documentation

## 2019-06-23 DIAGNOSIS — R0602 Shortness of breath: Secondary | ICD-10-CM | POA: Insufficient documentation

## 2019-06-23 DIAGNOSIS — Z79899 Other long term (current) drug therapy: Secondary | ICD-10-CM | POA: Insufficient documentation

## 2019-06-23 DIAGNOSIS — R1110 Vomiting, unspecified: Secondary | ICD-10-CM | POA: Diagnosis not present

## 2019-06-23 DIAGNOSIS — R002 Palpitations: Secondary | ICD-10-CM

## 2019-06-23 MED ORDER — ONDANSETRON HCL 8 MG PO TABS: 8.0000 mg | ORAL_TABLET | Freq: Three times a day (TID) | ORAL | 0 refills | Status: DC | PRN

## 2019-06-23 MED ORDER — BENZONATATE 200 MG PO CAPS: 200.0000 mg | ORAL_CAPSULE | Freq: Two times a day (BID) | ORAL | 0 refills | Status: DC | PRN

## 2019-06-23 NOTE — ED Triage Notes (Signed)
Patient woke with coughing and reflux.  Patient did have vomiting this morning.  Somewhat sob.  Since being at work (10 am) teeth and jaw pain. Pain in center chest.

## 2019-06-23 NOTE — ED Provider Notes (Signed)
[facility]    CSN: [id number] Arrival date & time: 06/23/19  1305      History   Chief Complaint Chief Complaint  Patient presents with  . Cough  . Chest Pain    HPI Jamie [name] is a 64 y.o. female.   HPI  Patient states that she is here for coughing, acid reflux.  She did vomit this morning.  She feels somewhat short of breath.  This morning she had some tooth and jaw pain centrally.  She had pain in the center chest.  This is not a pressure pain but a stabbing kind of chest pain.  She feels rapid heartbeat.  No lightheadedness.  No diaphoresis. Patient is worried about "heart attack".  She has hypertension, diabetes, hyperlipidemia. I saw her in triage and explained to her that her EKG was tachycardic but not an ischemic appearance.  I recommended that the patient was seen in the emergency room.  Patient declined emergency room evaluation and preferred to stay in the office.  I told her that we would do Covid testing, watch her for a few minutes, and then transfer she continued symptomatic.   Past Medical History:  Diagnosis Date  . Allergy   . Diabetes mellitus    ORAL MEDS-NO INSULIN  . Endometrial cancer ([place]) 03/2011   03/2011  . GERD (gastroesophageal reflux disease)   . Hiatal hernia   . History of cervical dysplasia   . Hyperlipidemia   . Malignant neoplasm of body of uterus ([place]) 03/21/2011   S.p hysterectomy  . Ovarian cancer ([place]) 03/2011   03/2011  . PONV (postoperative nausea and vomiting)    ONLY AFTER CERVICAL FUSION--NO PROB WITH OTHER SURGERIES  . Ulcer    history of gastritis    Patient Active Problem List   Diagnosis Date Noted  . History of endometrial cancer 05/12/2019  . Rash 10/28/2018  . History of ovarian cancer 03/27/2018  . Abdominal pain 05/22/2012  . Lymphedema of leg 08/27/2011  . Nonspecific elevation of levels of transaminase or lactic acid dehydrogenase (LDH) 08/03/2010  . Diabetes mellitus, type 2 ([place])  04/25/2007  . Hyperlipidemia associated with type 2 diabetes mellitus ([place]) 01/27/2007  . Hypertension associated with diabetes ([place]) 01/27/2007  . Allergic rhinitis 01/27/2007  . GERD 01/27/2007  . MENOPAUSE-RELATED VASOMOTOR SYMPTOMS, HOT FLASHES 01/27/2007  . Fibromyalgia 01/27/2007  . CHEST PAIN, ATYPICAL 01/27/2007    Past Surgical History:  Procedure Laterality Date  . ACHILLES TENDON SURGERY     MULTIPLE SURGERIES BECAUSE OF STAPH INFECTION  . CERVICAL FUSION  09/2003  . GYNECOLOGIC CRYOSURGERY  02/2003  . LUMBAR FUSION  11/1998  . LYMPH NODE DISSECTION  2012  . TOTAL VAGINAL HYSTERECTOMY  03/29/2011    OB History   No obstetric history on file.      Home Medications    Prior to Admission medications   Medication Sig Start Date End Date Taking? Authorizing Provider  atorvastatin (LIPITOR) 40 MG tablet Take 1 tablet (40 mg total) by mouth daily. 11/24/18  Yes Marin Olp, MD  benazepril (LOTENSIN) 10 MG tablet TAKE 1 TABLET BY MOUTH DAILY 03/09/19  Yes Marin Olp, MD  cyclobenzaprine (FLEXERIL) 10 MG tablet cyclobenzaprine 10 mg tablet   Yes [provider]  glimepiride (AMARYL) 2 MG tablet Take 1 tablet (2 mg total) by mouth daily. 11/19/18  Yes Marin Olp, MD  metFORMIN (GLUCOPHAGE) 1000 MG tablet TAKE 1 TABLET BY MOUTH TWICE A DAY  WITH MEALS 03/02/19  Yes Marin Olp, MD  simvastatin (ZOCOR) 40 MG tablet TAKE 1 TABLET (40 MG TOTAL) BY MOUTH AT BEDTIME. 03/31/19  Yes Marin Olp, MD  ALPRAZolam Duanne Moron) 0.5 MG tablet Take 1 tablet (0.5 mg total) by mouth at bedtime as needed. 11/19/18   Marin Olp, MD  benzonatate (TESSALON) 200 MG capsule Take 1 capsule (200 mg total) by mouth 2 (two) times daily as needed for cough. 06/23/19   Raylene Everts, MD  ondansetron (ZOFRAN) 8 MG tablet Take 1 tablet (8 mg total) by mouth every 8 (eight) hours as needed for nausea or vomiting. 06/23/19   Raylene Everts, MD  triamcinolone cream  (KENALOG) 0.1 % Apply 1 application topically 2 (two) times daily. For 10 days maximum and then take at least 10 day break 10/28/18   Marin Olp, MD    Family History Family History  Problem Relation Age of Onset  . Diabetes Father   . Heart disease Father   . Prostate cancer Father   . Lymphoma Father   . Lung cancer Mother   . Ovarian cancer Cousin   . Uterine cancer Cousin   . Breast cancer Maternal Aunt   . Colon cancer Paternal Grandmother 51  . Hemochromatosis Maternal Grandmother   . Diabetes Paternal Grandfather   . Lung cancer Sister   . Healthy Brother     Social History Social History   Tobacco Use  . Smoking status: Never Smoker  . Smokeless tobacco: Never Used  Substance Use Topics  . Alcohol use: Not on file    Comment: once every 2-3 months  . Drug use: Not on file     Allergies   Oxycodone-acetaminophen   Review of Systems Review of Systems  Constitutional: Positive for fatigue. Negative for chills, diaphoresis and fever.  HENT: Negative for congestion.   Respiratory: Positive for cough and shortness of breath.   Cardiovascular: Positive for chest pain and palpitations. Negative for leg swelling.  Gastrointestinal: Positive for nausea and vomiting.  Neurological: Negative for dizziness, weakness, light-headedness and headaches.     Physical Exam Triage Vital Signs ED Triage Vitals  Enc Vitals Group     BP 06/23/19 1325 (!) 180/95     Pulse Rate 06/23/19 1325 (!) 135     Resp 06/23/19 1325 (!) 22     Temp 06/23/19 1325 100.2 F (37.9 C)     Temp Source 06/23/19 1325 Oral     SpO2 06/23/19 1325 97 %     Weight --      Height --      Head Circumference --      Peak Flow --      Pain Score 06/23/19 1322 5     Pain Loc --      Pain Edu? --      Excl. in [place]? --    No data found.  Updated Vital Signs BP (!) 180/95 (BP Location: Right Arm)   Pulse (!) 135   Temp 100.2 F (37.9 C) (Oral)   Resp (!) 22   LMP  (LMP Unknown)    SpO2 97%      Physical Exam Constitutional:      General: She is not in acute distress.    Appearance: She is well-developed and normal weight. She is not ill-appearing.  HENT:     Head: Normocephalic and atraumatic.     Mouth/Throat:     Mouth: Mucous membranes are  dry.     Comments: Mask in place Eyes:     Conjunctiva/sclera: Conjunctivae normal.     Pupils: Pupils are equal, round, and reactive to light.  Neck:     Vascular: No carotid bruit.  Cardiovascular:     Rate and Rhythm: Tachycardia present.     Heart sounds: Normal heart sounds. No murmur.  Pulmonary:     Effort: Pulmonary effort is normal. No respiratory distress.     Breath sounds: Normal breath sounds. No wheezing, rhonchi or rales.  Abdominal:     General: Bowel sounds are normal. There is no distension.     Palpations: Abdomen is soft.  Musculoskeletal:        General: Normal range of motion.     Cervical back: Normal range of motion and neck supple.     Right lower leg: No edema.     Left lower leg: No edema.  Skin:    General: Skin is warm and dry.  Neurological:     General: No focal deficit present.     Mental Status: She is alert.  Psychiatric:        Mood and Affect: Mood normal.        Behavior: Behavior normal.    On repeat vital signs blood pressure is 150/90.  Pulse 118.  UC Treatments / Results  Labs (all labs ordered are listed, but only abnormal results are displayed) Labs Reviewed  NOVEL CORONAVIRUS, NAA (HOSP ORDER, SEND-OUT TO REF LAB; TAT 18-24 HRS)  POC SARS CORONAVIRUS 2 AG -  ED  POC SARS CORONAVIRUS 2 AG  The point-of-care SARS is negative. Confirmation is sent to lab  EKG-sinus tachycardia.  Pattern consistent with left atrial enlargement.  No ST or T wave changes.  Appear with prior, faster rate   Radiology No results found.  Procedures Procedures (including critical care time)  Medications Ordered in UC Medications - No data to display  Initial Impression /  Assessment and Plan / UC Course  I have reviewed the triage vital signs and the nursing notes.  Pertinent labs & imaging results that were available during my care of the patient were reviewed by me and considered in my medical decision making (see chart for details).     I feel that the patient may still have coronavirus.  This is discussed with her.  She is recommended to quarantine pending test results. I again discussed with her an emergency room visit.  Since her blood pressure and pulse are improving.  Patient admits that she is dehydrated.  Was very anxious.  She thinks that should be fine going home. She understands and I wish for her to go the emergency room immediately, even call 911, if she feels worse instead of better at any time Final Clinical Impressions(s) / UC Diagnoses   Final diagnoses:  Cough  SOB (shortness of breath)  Palpitations     Discharge Instructions     Go home to rest Drink plenty of fluids Take Tylenol for pain or fever Take ondansetron for nausea Take the tessalon for cough You may take over-the-counter cough and cold medicines as needed You must quarantine at home until your test result is available GO TO ER IF WORSE You can check for your test result in MyChart    ED Prescriptions    Medication Sig Dispense Auth. Provider   ondansetron (ZOFRAN) 8 MG tablet Take 1 tablet (8 mg total) by mouth every 8 (eight) hours as needed for  nausea or vomiting. 20 tablet Raylene Everts, MD   benzonatate (TESSALON) 200 MG capsule Take 1 capsule (200 mg total) by mouth 2 (two) times daily as needed for cough. 20 capsule Raylene Everts, MD     PDMP not reviewed this encounter.   Raylene Everts, MD 06/23/19 2234

## 2019-06-23 NOTE — Discharge Instructions (Signed)
Go home to rest Drink plenty of fluids Take Tylenol for pain or fever Take ondansetron for nausea Take the tessalon for cough You may take over-the-counter cough and cold medicines as needed You must quarantine at home until your test result is available GO TO ER IF WORSE You can check for your test result in MyChart

## 2019-06-23 NOTE — ED Triage Notes (Signed)
Notified dr Meda Coffee of patient's situation and abnormal vital signs obtained at intake

## 2019-06-25 LAB — NOVEL CORONAVIRUS, NAA (HOSP ORDER, SEND-OUT TO REF LAB; TAT 18-24 HRS): SARS-CoV-2, NAA: NOT DETECTED

## 2019-06-27 ENCOUNTER — Emergency Department (HOSPITAL_COMMUNITY): Payer: 59

## 2019-06-27 ENCOUNTER — Other Ambulatory Visit: Payer: Self-pay

## 2019-06-27 ENCOUNTER — Encounter (HOSPITAL_COMMUNITY): Payer: Self-pay | Admitting: Emergency Medicine

## 2019-06-27 ENCOUNTER — Emergency Department (HOSPITAL_COMMUNITY)
Admission: EM | Admit: 2019-06-27 | Discharge: 2019-06-27 | Disposition: A | Discharge status: Home / Self Care | Payer: 59 | Attending: Emergency Medicine | Admitting: Emergency Medicine

## 2019-06-27 DIAGNOSIS — Z79899 Other long term (current) drug therapy: Secondary | ICD-10-CM | POA: Insufficient documentation

## 2019-06-27 DIAGNOSIS — Z8542 Personal history of malignant neoplasm of other parts of uterus: Secondary | ICD-10-CM | POA: Insufficient documentation

## 2019-06-27 DIAGNOSIS — R072 Precordial pain: Secondary | ICD-10-CM | POA: Insufficient documentation

## 2019-06-27 DIAGNOSIS — E119 Type 2 diabetes mellitus without complications: Secondary | ICD-10-CM | POA: Insufficient documentation

## 2019-06-27 DIAGNOSIS — R0789 Other chest pain: Secondary | ICD-10-CM | POA: Diagnosis present

## 2019-06-27 DIAGNOSIS — R0602 Shortness of breath: Secondary | ICD-10-CM | POA: Diagnosis not present

## 2019-06-27 DIAGNOSIS — Z8543 Personal history of malignant neoplasm of ovary: Secondary | ICD-10-CM | POA: Insufficient documentation

## 2019-06-27 DIAGNOSIS — R112 Nausea with vomiting, unspecified: Secondary | ICD-10-CM | POA: Diagnosis not present

## 2019-06-27 DIAGNOSIS — R208 Other disturbances of skin sensation: Secondary | ICD-10-CM | POA: Diagnosis not present

## 2019-06-27 DIAGNOSIS — Z7984 Long term (current) use of oral hypoglycemic drugs: Secondary | ICD-10-CM | POA: Insufficient documentation

## 2019-06-27 LAB — BASIC METABOLIC PANEL
Anion gap: 10 (ref 5–15)
BUN: 8 mg/dL (ref 8–23)
CO2: 26 mmol/L (ref 22–32)
Calcium: 9.8 mg/dL (ref 8.9–10.3)
Chloride: 104 mmol/L (ref 98–111)
Creatinine, Ser: 0.93 mg/dL (ref 0.44–1.00)
GFR calc Af Amer: >60 mL/min (ref >60)
GFR calc non Af Amer: >60 mL/min (ref >60)
Glucose, Bld: 133 mg/dL — ABNORMAL HIGH (ref 70–99)
Potassium: 4.7 mmol/L (ref 3.5–5.1)
Sodium: 140 mmol/L (ref 135–145)

## 2019-06-27 LAB — CBC
HCT: 45.9 % (ref 36.0–46.0)
Hemoglobin: 15.2 g/dL — ABNORMAL HIGH (ref 12.0–15.0)
MCH: 29.4 pg (ref 26.0–34.0)
MCHC: 33.1 g/dL (ref 30.0–36.0)
MCV: 88.8 fL (ref 80.0–100.0)
Platelets: 346 10*3/uL (ref 150–400)
RBC: 5.17 MIL/uL — ABNORMAL HIGH (ref 3.87–5.11)
RDW: 13 % (ref 11.5–15.5)
WBC: 7.7 10*3/uL (ref 4.0–10.5)
nRBC: 0 % (ref 0.0–0.2)

## 2019-06-27 LAB — TROPONIN I (HIGH SENSITIVITY)
Troponin I (High Sensitivity): 7 ng/L (ref <18)
Troponin I (High Sensitivity): <2 ng/L (ref <18)

## 2019-06-27 LAB — D-DIMER, QUANTITATIVE: D-Dimer, Quant: 0.36 ug/mL-FEU (ref 0.00–0.50)

## 2019-06-27 MED ORDER — SODIUM CHLORIDE 0.9% FLUSH: 3.0000 mL | Freq: Once | INTRAVENOUS | Status: DC

## 2019-06-27 NOTE — ED Notes (Signed)
Pt in xray

## 2019-06-27 NOTE — Discharge Instructions (Addendum)
Work-up for the chest pain today without any acute findings.  Also no evidence of any blood clots in the lungs.  However this does not completely rule out that the discomfort is not a warning sign from the heart.  Return for any new or worse symptoms.  Call cardiology for follow-up.  Also start taking a baby aspirin a day.

## 2019-06-27 NOTE — ED Notes (Signed)
The pt is upset  She reports tghat she has been here for 5 hours and is ready to leave.  edp notified  hes been busy

## 2019-06-27 NOTE — ED Provider Notes (Signed)
[facility] EMERGENCY DEPARTMENT Provider Note   CSN: [id number] Arrival date & time: 06/27/19  1310     History Chief Complaint  Patient presents with  . Chest Pain  . Numbness    Jamie [name] is a 64 y.o. female.  Patient comes to the emergency department with a complaint left-sided chest pain left substernal chest pain since Tuesday associated with some shortness of breath and nausea.  Tuesday actually had several episodes of nausea and vomiting and dry heaves.  But that has improved since then.  Also there is a strange feeling to the left side of her face is not really pain but not really numbness.  Patient states that the pain is lasted anywhere from 10 minutes to 25 minutes.  Occurs each day and several times during the day.  Also has started to move and to the left arm.  Patient states she has some slight right leg swelling all the time but may be some leg swelling on the left now as well but no pain in the calf or any leg pain.  Past medical history is significant for history of GERD hyperlipidemia diabetes oral meds only.  Ovarian cancer back in 2012 and a malignant neoplasm of the body of the uterus in 2012 as well.  Patient's had a total vaginal hysterectomy.        Past Medical History:  Diagnosis Date  . Allergy   . Diabetes mellitus    ORAL MEDS-NO INSULIN  . Endometrial cancer ([place]) 03/2011   03/2011  . GERD (gastroesophageal reflux disease)   . Hiatal hernia   . History of cervical dysplasia   . Hyperlipidemia   . Malignant neoplasm of body of uterus ([place]) 03/21/2011   S.p hysterectomy  . Ovarian cancer ([place]) 03/2011   03/2011  . PONV (postoperative nausea and vomiting)    ONLY AFTER CERVICAL FUSION--NO PROB WITH OTHER SURGERIES  . Ulcer    history of gastritis    Patient Active Problem List   Diagnosis Date Noted  . History of endometrial cancer 05/12/2019  . Rash 10/28/2018  . History of ovarian cancer 03/27/2018  . Abdominal  pain 05/22/2012  . Lymphedema of leg 08/27/2011  . Nonspecific elevation of levels of transaminase or lactic acid dehydrogenase (LDH) 08/03/2010  . Diabetes mellitus, type 2 ([place]) 04/25/2007  . Hyperlipidemia associated with type 2 diabetes mellitus ([place]) 01/27/2007  . Hypertension associated with diabetes ([place]) 01/27/2007  . Allergic rhinitis 01/27/2007  . GERD 01/27/2007  . MENOPAUSE-RELATED VASOMOTOR SYMPTOMS, HOT FLASHES 01/27/2007  . Fibromyalgia 01/27/2007  . CHEST PAIN, ATYPICAL 01/27/2007    Past Surgical History:  Procedure Laterality Date  . ACHILLES TENDON SURGERY     MULTIPLE SURGERIES BECAUSE OF STAPH INFECTION  . CERVICAL FUSION  09/2003  . GYNECOLOGIC CRYOSURGERY  02/2003  . LUMBAR FUSION  11/1998  . LYMPH NODE DISSECTION  2012  . TOTAL VAGINAL HYSTERECTOMY  03/29/2011     OB History   No obstetric history on file.     Family History  Problem Relation Age of Onset  . Diabetes Father   . Heart disease Father   . Prostate cancer Father   . Lymphoma Father   . Lung cancer Mother   . Ovarian cancer Cousin   . Uterine cancer Cousin   . Breast cancer Maternal Aunt   . Colon cancer Paternal Grandmother 20  . Hemochromatosis Maternal Grandmother   . Diabetes Paternal Grandfather   . Lung  cancer Sister   . Healthy Brother     Social History   Tobacco Use  . Smoking status: Never Smoker  . Smokeless tobacco: Never Used  Substance Use Topics  . Alcohol use: Not on file    Comment: once every 2-3 months  . Drug use: Not on file    Home Medications Prior to Admission medications   Medication Sig Start Date End Date Taking? Authorizing Provider  ALPRAZolam Duanne Moron) 0.5 MG tablet Take 1 tablet (0.5 mg total) by mouth at bedtime as needed. 11/19/18   Marin Olp, MD  atorvastatin (LIPITOR) 40 MG tablet Take 1 tablet (40 mg total) by mouth daily. 11/24/18   Marin Olp, MD  benazepril (LOTENSIN) 10 MG tablet TAKE 1 TABLET BY MOUTH DAILY 03/09/19    Marin Olp, MD  benzonatate (TESSALON) 200 MG capsule Take 1 capsule (200 mg total) by mouth 2 (two) times daily as needed for cough. 06/23/19   Raylene Everts, MD  cyclobenzaprine (FLEXERIL) 10 MG tablet cyclobenzaprine 10 mg tablet    [provider]  glimepiride (AMARYL) 2 MG tablet Take 1 tablet (2 mg total) by mouth daily. 11/19/18   Marin Olp, MD  metFORMIN (GLUCOPHAGE) 1000 MG tablet TAKE 1 TABLET BY MOUTH TWICE A DAY WITH MEALS 03/02/19   Marin Olp, MD  ondansetron (ZOFRAN) 8 MG tablet Take 1 tablet (8 mg total) by mouth every 8 (eight) hours as needed for nausea or vomiting. 06/23/19   Raylene Everts, MD  simvastatin (ZOCOR) 40 MG tablet TAKE 1 TABLET (40 MG TOTAL) BY MOUTH AT BEDTIME. 03/31/19   Marin Olp, MD  triamcinolone cream (KENALOG) 0.1 % Apply 1 application topically 2 (two) times daily. For 10 days maximum and then take at least 10 day break 10/28/18   Marin Olp, MD    Allergies    Oxycodone-acetaminophen  Review of Systems   Review of Systems  Constitutional: Negative for chills and fever.  HENT: Negative for congestion, rhinorrhea and sore throat.   Eyes: Negative for visual disturbance.  Respiratory: Positive for shortness of breath. Negative for cough.   Cardiovascular: Positive for chest pain and leg swelling.  Gastrointestinal: Positive for nausea and vomiting. Negative for abdominal pain and diarrhea.  Genitourinary: Negative for dysuria.  Musculoskeletal: Negative for back pain and neck pain.  Skin: Negative for rash.  Neurological: Negative for dizziness, light-headedness and headaches.  Hematological: Does not bruise/bleed easily.  Psychiatric/Behavioral: Negative for confusion.    Physical Exam Updated Vital Signs LMP  (LMP Unknown)   Physical Exam Vitals and nursing note reviewed.  Constitutional:      General: She is not in acute distress.    Appearance: Normal appearance. She is well-developed.    HENT:     Head: Normocephalic and atraumatic.  Eyes:     Extraocular Movements: Extraocular movements intact.     Conjunctiva/sclera: Conjunctivae normal.     Pupils: Pupils are equal, round, and reactive to light.  Cardiovascular:     Rate and Rhythm: Normal rate and regular rhythm.     Heart sounds: No murmur.  Pulmonary:     Effort: Pulmonary effort is normal. No respiratory distress.     Breath sounds: Normal breath sounds.  Chest:     Chest wall: No tenderness.  Abdominal:     Palpations: Abdomen is soft.     Tenderness: There is no abdominal tenderness.  Musculoskeletal:  General: No swelling. Normal range of motion.     Cervical back: Normal range of motion and neck supple.     Right lower leg: No edema.     Left lower leg: No edema.  Skin:    General: Skin is warm and dry.  Neurological:     General: No focal deficit present.     Mental Status: She is alert and oriented to person, place, and time.     Cranial Nerves: No cranial nerve deficit.     Sensory: No sensory deficit.     Motor: No weakness.     ED Results / Procedures / Treatments   Labs (all labs ordered are listed, but only abnormal results are displayed) Labs Reviewed  CBC - Abnormal; Notable for the following components:      Result Value   RBC 5.17 (*)    Hemoglobin 15.2 (*)    All other components within normal limits  BASIC METABOLIC PANEL  TROPONIN I (HIGH SENSITIVITY)    EKG EKG Interpretation  Date/Time:  Saturday June 27 2019 13:12:53 EST Ventricular Rate:  96 PR Interval:  140 QRS Duration: 76 QT Interval:  342 QTC Calculation: 432 R Axis:   27 Text Interpretation: Normal sinus rhythm Low voltage QRS Cannot rule out Anteroseptal infarct , age undetermined Abnormal ECG Confirmed by Fredia Sorrow [phone]) on 06/27/2019 1:36:54 PM   Radiology No results found.  Procedures Procedures (including critical care time)  Medications Ordered in ED Medications  sodium  chloride flush (NS) 0.9 % injection 3 mL (3 mLs Intravenous Not Given 06/27/19 1406)    ED Course  I have reviewed the triage vital signs and the nursing notes.  Pertinent labs & imaging results that were available during my care of the patient were reviewed by me and considered in my medical decision making (see chart for details).    MDM Rules/Calculators/A&P                     Work-up for the chest pain patient's EKG without any acute changes.  Chest x-ray was negative for any acute cardiopulmonary findings.  Troponins x2 were normal.  No significant delta change.  D-dimer also was not elevated.  Patient's intermittent chest pain since Tuesday does have some concern.  But no indication for admission for cardiac rule out today.  Pain is not any worse today than its been the other days.  Would recommend starting a baby aspirin a day and following up with cardiology which patient says she will do.  Referral information provided.  Most importantly patient will return for any new or worse symptoms and she understands that warning signs from her heart has not been completely ruled out.  Final Clinical Impression(s) / ED Diagnoses Final diagnoses:  None    Rx / DC Orders ED Discharge Orders    None       Fredia Sorrow, MD 06/27/19 [phone]

## 2019-06-27 NOTE — ED Triage Notes (Signed)
Reports dull L sided chest pain that radiates to L arm since Tuesday with SOB and nausea.  Reports L sided dental pain x 1 week and L sided facial numbness since last night.  No arm drift.  Speech clear.

## 2019-08-13 ENCOUNTER — Ambulatory Visit: Payer: 59 | Attending: Internal Medicine

## 2019-08-13 DIAGNOSIS — Z23 Encounter for immunization: Secondary | ICD-10-CM

## 2019-08-13 NOTE — Progress Notes (Signed)
   Covid-19 Vaccination Clinic  Name:  Jamie [name]    MRN: [id number] DOB: 14-Jun-1955  08/13/2019  [name] was observed post Covid-19 immunization for 15 minutes without incident. She was provided with Vaccine Information Sheet and instruction to access the V-Safe system.   [name] was instructed to call 911 with any severe reactions post vaccine: Marland Kitchen Difficulty breathing  . Swelling of face and throat  . A fast heartbeat  . A bad rash all over body  . Dizziness and weakness   Immunizations Administered    Name Date Dose VIS Date Route   Pfizer COVID-19 Vaccine 08/13/2019  8:35 AM 0.3 mL 04/17/2019 Intramuscular   Manufacturer: [place]   Lot: [id number]   [place]: [id number]

## 2019-08-26 LAB — HM DIABETES EYE EXAM

## 2019-08-29 ENCOUNTER — Encounter: Payer: Self-pay | Admitting: Family Medicine

## 2019-09-03 ENCOUNTER — Encounter: Payer: Self-pay | Admitting: Family Medicine

## 2019-09-04 NOTE — Telephone Encounter (Signed)
Patient states today would not work, so she is scheduled for Tuesday 09/08/19 @ 240.

## 2019-09-04 NOTE — Telephone Encounter (Signed)
Called and LVM to schedule appt

## 2019-09-07 ENCOUNTER — Ambulatory Visit: Payer: 59 | Attending: Internal Medicine

## 2019-09-07 DIAGNOSIS — Z23 Encounter for immunization: Secondary | ICD-10-CM

## 2019-09-07 NOTE — Progress Notes (Signed)
   Covid-19 Vaccination Clinic  Name:  Jamie [name]    MRN: [id number] DOB: 15-Dec-1955  09/07/2019  Jamie [name] was observed post Covid-19 immunization for 30 minutes based on pre-vaccination screening without incident. She was provided with Vaccine Information Sheet and instruction to access the V-Safe system.   Jamie [name] was instructed to call 911 with any severe reactions post vaccine: Marland Kitchen Difficulty breathing  . Swelling of face and throat  . A fast heartbeat  . A bad rash all over body  . Dizziness and weakness   Immunizations Administered    Name Date Dose VIS Date Route   Pfizer COVID-19 Vaccine 09/07/2019  8:32 AM 0.3 mL 07/01/2018 Intramuscular   Manufacturer: [place]   Lot: [id number]   [place]: [id number]

## 2019-09-08 ENCOUNTER — Other Ambulatory Visit: Payer: Self-pay

## 2019-09-08 ENCOUNTER — Ambulatory Visit (INDEPENDENT_AMBULATORY_CARE_PROVIDER_SITE_OTHER): Payer: 59 | Admitting: Family Medicine

## 2019-09-08 ENCOUNTER — Encounter: Payer: Self-pay | Admitting: Family Medicine

## 2019-09-08 VITALS — BP 138/82 | HR 68 | Temp 97.6°F | Ht 62.0 in | Wt 137.0 lb

## 2019-09-08 DIAGNOSIS — M21619 Bunion of unspecified foot: Secondary | ICD-10-CM | POA: Diagnosis not present

## 2019-09-08 DIAGNOSIS — R079 Chest pain, unspecified: Secondary | ICD-10-CM

## 2019-09-08 DIAGNOSIS — E119 Type 2 diabetes mellitus without complications: Secondary | ICD-10-CM | POA: Diagnosis not present

## 2019-09-08 DIAGNOSIS — I10 Essential (primary) hypertension: Secondary | ICD-10-CM

## 2019-09-08 DIAGNOSIS — E785 Hyperlipidemia, unspecified: Secondary | ICD-10-CM

## 2019-09-08 DIAGNOSIS — E1169 Type 2 diabetes mellitus with other specified complication: Secondary | ICD-10-CM

## 2019-09-08 DIAGNOSIS — E1159 Type 2 diabetes mellitus with other circulatory complications: Secondary | ICD-10-CM

## 2019-09-08 DIAGNOSIS — I152 Hypertension secondary to endocrine disorders: Secondary | ICD-10-CM

## 2019-09-08 LAB — POCT GLYCOSYLATED HEMOGLOBIN (HGB A1C): Hemoglobin A1C: 6.5 % — AB (ref 4.0–5.6)

## 2019-09-08 MED ORDER — BENZONATATE 200 MG PO CAPS: 200.0000 mg | ORAL_CAPSULE | Freq: Two times a day (BID) | ORAL | 0 refills | Status: DC | PRN

## 2019-09-08 MED ORDER — ATORVASTATIN CALCIUM 40 MG PO TABS: 40.0000 mg | ORAL_TABLET | Freq: Every day | ORAL | 3 refills | Status: DC

## 2019-09-08 MED ORDER — SIMVASTATIN 40 MG PO TABS: 40.0000 mg | ORAL_TABLET | Freq: Every day | ORAL | 2 refills | Status: DC

## 2019-09-08 MED ORDER — GLIMEPIRIDE 2 MG PO TABS: 2.0000 mg | ORAL_TABLET | Freq: Every day | ORAL | 3 refills | Status: DC

## 2019-09-08 MED ORDER — ALPRAZOLAM 0.5 MG PO TABS: 0.5000 mg | ORAL_TABLET | Freq: Every evening | ORAL | 2 refills | Status: DC | PRN

## 2019-09-08 NOTE — Progress Notes (Signed)
Phone [phone] In person visit   Subjective:   Jamie [name] is a 64 y.o. year old very pleasant female patient who presents for/with See problem oriented charting Chief Complaint  Patient presents with  . Diabetes     needs labs not fasting   . Anxiety  . Cough   This visit occurred during the SARS-CoV-2 public health emergency.  Safety protocols were in place, including screening questions prior to the visit, additional usage of staff PPE, and extensive cleaning of exam room while observing appropriate contact time as indicated for disinfecting solutions.   Past Medical History-  Patient Active Problem List   Diagnosis Date Noted  . Diabetes mellitus, type 2 ([place]) 04/25/2007    Priority: High  . Lymphedema of leg 08/27/2011    Priority: Medium  . Hyperlipidemia associated with type 2 diabetes mellitus ([place]) 01/27/2007    Priority: Medium  . Hypertension associated with diabetes ([place]) 01/27/2007    Priority: Medium  . Fibromyalgia 01/27/2007    Priority: Medium  . Rash 10/28/2018    Priority: Low  . History of ovarian cancer 03/27/2018    Priority: Low  . Abdominal pain 05/22/2012    Priority: Low  . Nonspecific elevation of levels of transaminase or lactic acid dehydrogenase (LDH) 08/03/2010    Priority: Low  . Allergic rhinitis 01/27/2007    Priority: Low  . GERD 01/27/2007    Priority: Low  . MENOPAUSE-RELATED VASOMOTOR SYMPTOMS, HOT FLASHES 01/27/2007    Priority: Low  . CHEST PAIN, ATYPICAL 01/27/2007    Priority: Low  . History of endometrial cancer 05/12/2019    Medications- reviewed and updated Current Outpatient Medications  Medication Sig Dispense Refill  . ALPRAZolam (XANAX) 0.5 MG tablet Take 1 tablet (0.5 mg total) by mouth at bedtime as needed. 30 tablet 2  . atorvastatin (LIPITOR) 40 MG tablet Take 1 tablet (40 mg total) by mouth daily. 90 tablet 3  . benazepril (LOTENSIN) 10 MG tablet TAKE 1 TABLET BY MOUTH DAILY 90 tablet 2  .  benzonatate (TESSALON) 200 MG capsule Take 1 capsule (200 mg total) by mouth 2 (two) times daily as needed for cough. 20 capsule 0  . cyclobenzaprine (FLEXERIL) 10 MG tablet cyclobenzaprine 10 mg tablet    . glimepiride (AMARYL) 2 MG tablet Take 1 tablet (2 mg total) by mouth daily. 90 tablet 3  . metFORMIN (GLUCOPHAGE) 1000 MG tablet TAKE 1 TABLET BY MOUTH TWICE A DAY WITH MEALS 180 tablet 2  . ondansetron (ZOFRAN) 8 MG tablet Take 1 tablet (8 mg total) by mouth every 8 (eight) hours as needed for nausea or vomiting. 20 tablet 0  . simvastatin (ZOCOR) 40 MG tablet TAKE 1 TABLET (40 MG TOTAL) BY MOUTH AT BEDTIME. 90 tablet 2  . triamcinolone cream (KENALOG) 0.1 % Apply 1 application topically 2 (two) times daily. For 10 days maximum and then take at least 10 day break 30 g 3   No current facility-administered medications for this visit.     Objective:  BP 138/82   Pulse 68   Temp 97.6 F (36.4 C) (Temporal)   Ht 5\' 2"  (1.575 m)   Wt 137 lb (62.1 kg)   LMP  (LMP Unknown)   SpO2 95%   BMI 25.06 kg/m  Gen: NAD, resting comfortably CV: RRR no murmurs rubs or gallops Lungs: CTAB no crackles, wheeze, rhonchi Ext: no edema Skin: warm, dry Neuro: grossly normal, moves all extremities   Diabetic Foot Exam - Simple  Simple Foot Form Diabetic Foot exam was performed with the following findings: Yes 09/08/2019  2:56 PM  Visual Inspection See comments: Yes Sensation Testing Intact to touch and monofilament testing bilaterally: Yes Pulse Check Posterior Tibialis and Dorsalis pulse intact bilaterally: Yes Comments Bunion on right toe would like referral to podiatry         Assessment and Plan   # Diabetes S: Medication: Glimepiride 2mg  (started last year after a1c of 9.1- plan was for 3 months and 1 day follow up but that was not scheduled) metformin 1000mg  BID .  CBGs-  at home will average 117-193 after eating. She does not check fasting. Only had 1 low blood sugar in last year or  so- got as low as 67- ate some candy and improved- missed meal night before Exercise and diet-  Very active at work and running [employer] in [place]. Tries to eat heathy diet.  Lab Results  Component Value Date   HGBA1C  POC 6.5 (A) 09/08/2019   HGBA1C 9.1 (H) 11/17/2018   HGBA1C 7.6 (A) 03/27/2018   A/P: much improved control- continue current medications  - Went to get eye checked due to changes in vision and eye doctor requested updated a1c. Thankfully a1c controlled   #hyperlipidemia S: Medication:On atorvastatin 40 mg.  Previously on simvastatin 40 mg but LDL was above 100 last July. Feels better than on simvastatin A/P: Hopefully improved control-update direct LDL with labs today.  Ideal goal would be under 70 but at least once under 100   #hypertension S: medication: Benazepril 10 mg Home readings #s:  Has been a while since checked BP Readings from Last 3 Encounters:  09/08/19 138/82  06/27/19 (!) 141/86  06/23/19 (!) 180/95  A/P: High normal control in office today.  She has not been checking at home recently-I encouraged her to check at least every week or every other week with goal less than 140/90.  If above goal she will let us know.    # Anxiety/insomnia  S: Xanax 0.5mg . takes to help sleep. On average will take twice a week to help with sleeping. Will need refill today. Was out last night and couldn't sleep.  A/P: Last refill of Xanax was #30 with 2 refills last July-clear evidence that she is using this sparingly-we will go ahead and refill this medication today  # Bunion x2 S:gets some mild pain on right bunion- less issues on the left. Would like to see podiatry for their opinion  A/P: refer to podiatry originally planned- shed prefer to wait until next visit especially with chest pain as below  # chest pain S: 64 year old female with history of chest pain in February 2021.  Troponins negative x2 D-dimer was not elevated ruling out pulmonary embolism. She di dstart  taking an aspirin.   Pain across her chest- went to urgent care but she declined ED visit but had to go back 5 days later with worsening pain and went to ED- left arm and neck and teeth hurt. Worsened with exertion. Occasionally gets similar symptoms A/P: 64 year old female with chest pain in February-plan was referral to cardiology-she called and tried to get in but they would not let her in with a referral.  We are placing referral today.  I asked her to take it easy until that time-if she has significant worsening of her symptoms she should seek care immediately.  -Several risk factors including diabetes, hypertension, hyperlipidemia-fortunately these are controlled at this present time.  She also  has family history of heart disease in her father  # cough after covid shot plus allergies- she likes tessalon. Not bad enough that she wants to take regular allergy meds yet  Recommended follow up: physical 4 months. Sooner if you need [place].   Lab/Order associations:   ICD-10-CM   1. Type 2 diabetes mellitus without complication, without long-term current use of insulin (HCC)  E11.9 POCT HgB A1C    Direct LDL    CBC with Differential/Platelet    Comprehensive metabolic panel  2. Hypertension associated with diabetes ([place])  E11.59    I10   3. Hyperlipidemia associated with type 2 diabetes mellitus ([place])  E11.69    E78.5   4. Bunion of great toe  M21.619   5. Chest pain, unspecified type  R07.9 Ambulatory referral to Cardiology    Meds ordered this encounter  Medications  . atorvastatin (LIPITOR) 40 MG tablet    Sig: Take 1 tablet (40 mg total) by mouth daily.    Dispense:  90 tablet    Refill:  3  . benzonatate (TESSALON) 200 MG capsule    Sig: Take 1 capsule (200 mg total) by mouth 2 (two) times daily as needed for cough.    Dispense:  20 capsule    Refill:  0  . glimepiride (AMARYL) 2 MG tablet    Sig: Take 1 tablet (2 mg total) by mouth daily.    Dispense:  90 tablet    Refill:  3   . DISCONTD: simvastatin (ZOCOR) 40 MG tablet    Sig: Take 1 tablet (40 mg total) by mouth at bedtime.    Dispense:  90 tablet    Refill:  2  . ALPRAZolam (XANAX) 0.5 MG tablet    Sig: Take 1 tablet (0.5 mg total) by mouth at bedtime as needed.    Dispense:  30 tablet    Refill:  2    Return precautions advised.  Garret Reddish, MD

## 2019-09-08 NOTE — Patient Instructions (Addendum)
Health Maintenance Due  Topic Date Due  . FOOT EXAM done in office  03/28/2019      . PAP SMEAR-Modifier will send for notes  07/26/2019    Start checking your blood pressure at home and bring readings with you to your next appointment.  - Goal less than 140/90 (if consistently higher let our office know.)  -Check once a week or once every other week.   If you do not hear from  cardiology  in the next two weeks let our office know. So that we can follow up on that.   Take it easy and limit activity until after you have been seen by cardiology  If you have increased or new symptoms go to ED for evaluation.   Physical in 4 months or sooner if needed.   If cough continues let our office know.   Good luck with retirement in December.

## 2019-09-09 LAB — CBC WITH DIFFERENTIAL/PLATELET
Basophils Absolute: 0.1 10*3/uL (ref 0.0–0.1)
Basophils Relative: 0.8 % (ref 0.0–3.0)
Eosinophils Absolute: 0.6 10*3/uL (ref 0.0–0.7)
Eosinophils Relative: 9 % — ABNORMAL HIGH (ref 0.0–5.0)
HCT: 41.7 % (ref 36.0–46.0)
Hemoglobin: 13.9 g/dL (ref 12.0–15.0)
Lymphocytes Relative: 25.7 % (ref 12.0–46.0)
Lymphs Abs: 1.7 10*3/uL (ref 0.7–4.0)
MCHC: 33.2 g/dL (ref 30.0–36.0)
MCV: 88.3 fl (ref 78.0–100.0)
Monocytes Absolute: 0.6 10*3/uL (ref 0.1–1.0)
Monocytes Relative: 8.9 % (ref 3.0–12.0)
Neutro Abs: 3.7 10*3/uL (ref 1.4–7.7)
Neutrophils Relative %: 55.6 % (ref 43.0–77.0)
Platelets: 284 10*3/uL (ref 150.0–400.0)
RBC: 4.73 Mil/uL (ref 3.87–5.11)
RDW: 13.9 % (ref 11.5–15.5)
WBC: 6.6 10*3/uL (ref 4.0–10.5)

## 2019-09-09 LAB — COMPREHENSIVE METABOLIC PANEL
ALT: 15 U/L (ref 0–35)
AST: 12 U/L (ref 0–37)
Albumin: 4.3 g/dL (ref 3.5–5.2)
Alkaline Phosphatase: 104 U/L (ref 39–117)
BUN: 9 mg/dL (ref 6–23)
CO2: 30 mEq/L (ref 19–32)
Calcium: 9.7 mg/dL (ref 8.4–10.5)
Chloride: 104 mEq/L (ref 96–112)
Creatinine, Ser: 0.84 mg/dL (ref 0.40–1.20)
GFR: 68.15 mL/min (ref >60.00)
Glucose, Bld: 118 mg/dL — ABNORMAL HIGH (ref 70–99)
Potassium: 4.5 mEq/L (ref 3.5–5.1)
Sodium: 139 mEq/L (ref 135–145)
Total Bilirubin: 0.5 mg/dL (ref 0.2–1.2)
Total Protein: 6.1 g/dL (ref 6.0–8.3)

## 2019-09-09 LAB — LDL CHOLESTEROL, DIRECT: Direct LDL: 101 mg/dL

## 2019-09-15 ENCOUNTER — Other Ambulatory Visit: Payer: Self-pay

## 2019-09-15 ENCOUNTER — Encounter: Payer: Self-pay | Admitting: Family Medicine

## 2019-09-15 MED ORDER — ROSUVASTATIN CALCIUM 40 MG PO TABS: 40.0000 mg | ORAL_TABLET | Freq: Every day | ORAL | 3 refills | Status: DC

## 2019-09-23 ENCOUNTER — Encounter: Payer: Self-pay | Admitting: Cardiovascular Disease

## 2019-09-23 ENCOUNTER — Other Ambulatory Visit: Payer: Self-pay

## 2019-09-23 ENCOUNTER — Ambulatory Visit (INDEPENDENT_AMBULATORY_CARE_PROVIDER_SITE_OTHER): Payer: 59 | Admitting: Cardiovascular Disease

## 2019-09-23 DIAGNOSIS — E1169 Type 2 diabetes mellitus with other specified complication: Secondary | ICD-10-CM

## 2019-09-23 DIAGNOSIS — I152 Hypertension secondary to endocrine disorders: Secondary | ICD-10-CM

## 2019-09-23 DIAGNOSIS — E785 Hyperlipidemia, unspecified: Secondary | ICD-10-CM

## 2019-09-23 DIAGNOSIS — R0789 Other chest pain: Secondary | ICD-10-CM

## 2019-09-23 DIAGNOSIS — I10 Essential (primary) hypertension: Secondary | ICD-10-CM

## 2019-09-23 DIAGNOSIS — E1159 Type 2 diabetes mellitus with other circulatory complications: Secondary | ICD-10-CM

## 2019-09-23 MED ORDER — METOPROLOL TARTRATE 100 MG PO TABS
ORAL_TABLET | ORAL | 0 refills | Status: DC
Start: 2019-09-23 — End: 2019-11-13

## 2019-09-23 NOTE — Assessment & Plan Note (Signed)
History of essential hypertension blood pressure measured today 124/76.  She is on benazepril.

## 2019-09-23 NOTE — Patient Instructions (Addendum)
Medication Instructions:  Take Metoprolol 100 mg two hours before CT when scheduled *If you need a refill on your cardiac medications before your next appointment, please call your pharmacy*   Lab Work: BMET (one week before CT when scheduled)   If you have labs (blood work) drawn today and your tests are completely normal, you will receive your results only by: Marland Kitchen MyChart Message (if you have MyChart) OR . A paper copy in the mail If you have any lab test that is abnormal or we need to change your treatment, we will call you to review the results.   Testing/Procedures: Your physician has requested that you have cardiac CT. Cardiac computed tomography (CT) is a painless test that uses an x-ray machine to take clear, detailed pictures of your heart. For further information please visit [email or web address]. Please follow instruction sheet as given.   Follow-Up: At [facility], you and your health needs are our priority.  As part of our continuing mission to provide you with exceptional heart care, we have created designated Provider Care Teams.  These Care Teams include your primary Cardiologist (physician) and Advanced Practice Providers (APPs -  Physician Assistants and Nurse Practitioners) who all work together to provide you with the care you need, when you need it.  We recommend signing up for the patient portal called "MyChart".  Sign up information is provided on this After Visit Summary.  MyChart is used to connect with patients for Virtual Visits (Telemedicine).  Patients are able to view lab/test results, encounter notes, upcoming appointments, etc.  Non-urgent messages can be sent to your provider as well.   To learn more about what you can do with MyChart, go to [email or web address].    Your next appointment:   As needed  The format for your next appointment:   In Person  Provider:   Quay Burow, MD    Your cardiac CT will be scheduled at one of the below  locations:   [facility] [address] [place], [place] [address] [phone]  [place] [address] [place], [place] [address] [phone]  If scheduled at [facility], please arrive at the [facility] main entrance of [facility] 30 minutes prior to test start time. Proceed to the [facility] Radiology Department (first floor) to check-in and test prep.  If scheduled at [facility], please arrive 15 mins early for check-in and test prep.  Please follow these instructions carefully (unless otherwise directed):  Hold all erectile dysfunction medications at least 3 days (72 hrs) prior to test.  On the Night Before the Test: . Be sure to Drink plenty of water. . Do not consume any caffeinated/decaffeinated beverages or chocolate 12 hours prior to your test. . Do not take any antihistamines 12 hours prior to your test. . If you take Metformin do not take 24 hours prior to test.   On the Day of the Test: . Drink plenty of water. Do not drink any water within one hour of the test. . Do not eat any food 4 hours prior to the test. . You may take your regular medications prior to the test.  . Take metoprolol (Lopressor) two hours prior to test. . HOLD Furosemide/Hydrochlorothiazide morning of the test. . FEMALES- please wear underwire-free bra if available       After the Test: . Drink plenty of water. . After receiving IV contrast, you may experience  a mild flushed feeling. This is normal. . On occasion, you may experience a mild rash up to 24 hours after the test. This is not dangerous. If this occurs, you can take Benadryl 25 mg and increase your fluid intake. . If you experience trouble breathing, this can be serious. If it is severe call 911 IMMEDIATELY. If it is mild, please call our office. . If you take any of these medications: Glipizide/Metformin, Avandament,  Glucavance, please do not take 48 hours after completing test unless otherwise instructed.   Once we have confirmed authorization from your insurance company, we will call you to set up a date and time for your test.   For non-scheduling related questions, please contact the cardiac imaging nurse navigator should you have any questions/concerns: Marchia Bond, Cardiac Imaging Nurse Navigator Burley Saver, Interim Cardiac Imaging Nurse [place] [facility] Direct Office Dial: [phone]   For scheduling needs, including cancellations and rescheduling, please call [phone].

## 2019-09-23 NOTE — Assessment & Plan Note (Signed)
History of hyperlipidemia on statin therapy with lipid profile performed 11/17/2018 revealing total cholesterol 187, LDL 101 and HDL 30.

## 2019-09-23 NOTE — Progress Notes (Signed)
09/23/2019 [place]   [birth year]  [id number]  Primary Physician Yong Channel Brayton Mars, MD Primary Cardiologist: Lorretta Harp MD Lupe Carney, [place]  HPI:  Jamie [name] is a 64 y.o. 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  She was referred by her PCP, Dr. Garret Reddish, for evaluation of atypical chest pain.  Her risk factors include treated hypertension, hyperlipidemia and diabetes.  She does have a family history of heart disease with her father's had stents.  She is never had a heart attack or stroke.  She has had endometrial and ovarian cancer in the past and has had right lower extremity lymphedema as result of treatment.  She has developed chest pressure over the last several months requiring urgent care and ER visits on several occasions which were unrevealing.  She gets chest pressure several times a week lasting minutes at a time with radiation to her neck and jaw.   Current Meds  Medication Sig  . ALPRAZolam (XANAX) 0.5 MG tablet Take 1 tablet (0.5 mg total) by mouth at bedtime as needed.  Marland Kitchen atorvastatin (LIPITOR) 40 MG tablet Take 1 tablet (40 mg total) by mouth daily.  . benazepril (LOTENSIN) 10 MG tablet TAKE 1 TABLET BY MOUTH DAILY  . benzonatate (TESSALON) 200 MG capsule Take 1 capsule (200 mg total) by mouth 2 (two) times daily as needed for cough.  . cyclobenzaprine (FLEXERIL) 10 MG tablet cyclobenzaprine 10 mg tablet  . glimepiride (AMARYL) 2 MG tablet Take 1 tablet (2 mg total) by mouth daily.  . metFORMIN (GLUCOPHAGE) 1000 MG tablet TAKE 1 TABLET BY MOUTH TWICE A DAY WITH MEALS  . ondansetron (ZOFRAN) 8 MG tablet Take 1 tablet (8 mg total) by mouth every 8 (eight) hours as needed for nausea or vomiting.  . rosuvastatin (CRESTOR) 40 MG tablet Take 1 tablet (40 mg total) by mouth daily.  Marland Kitchen triamcinolone cream  (KENALOG) 0.1 % Apply 1 application topically 2 (two) times daily. For 10 days maximum and then take at least 10 day break     Allergies  Allergen Reactions  . Oxycodone-Acetaminophen Itching and Swelling    Tylox    Social History   Socioeconomic History  . Marital status: Married    Spouse name: Not on file  . Number of children: 1  . Years of education: Not on file  . Highest education level: Not on file  Occupational History    Comment: receptionist  Tobacco Use  . Smoking status: Never Smoker  . Smokeless tobacco: Never Used  Substance and Sexual Activity  . Alcohol use: Not on file    Comment: once every 2-3 months  . Drug use: Not on file  . Sexual activity: Yes  Other Topics Concern  . Not on file  Social History Narrative   Married. 2 children (biological son 53, 1 step son 65 in 2017). 4 grandkids.       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    [place] a campground in [place]. [place], canoes.      Hobbies: roller skating- works at [employer] too. Also walks regularly. Thinking about doing bicycling on trainer.    Social Determinants of Health   Financial Resource Strain:   .  Difficulty of Paying Living Expenses:   Food Insecurity:   . Worried About Charity fundraiser in the Last Year:   . Arboriculturist in the Last Year:   Transportation Needs:   . Film/video editor (Medical):   Marland Kitchen Lack of Transportation (Non-Medical):   Physical Activity:   . Days of Exercise per Week:   . Minutes of Exercise per Session:   Stress:   . Feeling of Stress :   Social Connections:   . Frequency of Communication with Friends and Family:   . Frequency of Social Gatherings with Friends and Family:   . Attends Religious Services:   . Active Member of Clubs or Organizations:   . Attends Archivist Meetings:   Marland Kitchen Marital Status:   Intimate Partner  Violence:   . Fear of Current or Ex-Partner:   . Emotionally Abused:   Marland Kitchen Physically Abused:   . Sexually Abused:      Review of Systems: General: negative for chills, fever, night sweats or weight changes.  Cardiovascular: negative for chest pain, dyspnea on exertion, edema, orthopnea, palpitations, paroxysmal nocturnal dyspnea or shortness of breath Dermatological: negative for rash Respiratory: negative for cough or wheezing Urologic: negative for hematuria Abdominal: negative for nausea, vomiting, diarrhea, bright red blood per rectum, melena, or hematemesis Neurologic: negative for visual changes, syncope, or dizziness All other systems reviewed and are otherwise negative except as noted above.    Blood pressure 124/76, pulse 70, height 5\' 2"  (1.575 m), weight 136 lb 12.8 oz (62.1 kg), SpO2 98 %.  General appearance: alert and no distress Neck: no adenopathy, no carotid bruit, no JVD, supple, symmetrical, trachea midline and thyroid not enlarged, symmetric, no tenderness/mass/nodules Lungs: clear to auscultation bilaterally Heart: regular rate and rhythm, S1, S2 normal, no murmur, click, rub or gallop Extremities: extremities normal, atraumatic, no cyanosis or edema Pulses: 2+ and symmetric Skin: Skin color, texture, turgor normal. No rashes or lesions Neurologic: Alert and oriented X 3, normal strength and tone. Normal symmetric reflexes. Normal coordination and gait  EKG sinus rhythm at 70 without ST or T wave changes.  I personally reviewed this EKG.  ASSESSMENT AND PLAN:   Hyperlipidemia associated with type 2 diabetes mellitus ([place] ) History of hyperlipidemia on statin therapy with lipid profile performed 11/17/2018 revealing total cholesterol 187, LDL 101 and HDL 30.  Hypertension associated with diabetes ([place]) History of essential hypertension blood pressure measured today 124/76.  She is on benazepril.  Atypical chest pain Recent onset of atypical chest pain several  months ago.  She has had several ER visits for evaluation of this all of which were unrevealing.  The pain occurs several times a week, last for minutes at a time, and is associated with radiation to her neck and jaw.  She does have a history of treated hypertension, hyperlipidemia and family history.  Go to get a coronary CTA to further evaluate      Lorretta Harp MD [facility], [facility] 09/23/2019 4:43 PM

## 2019-09-23 NOTE — Assessment & Plan Note (Signed)
Recent onset of atypical chest pain several months ago.  She has had several ER visits for evaluation of this all of which were unrevealing.  The pain occurs several times a week, last for minutes at a time, and is associated with radiation to her neck and jaw.  She does have a history of treated hypertension, hyperlipidemia and family history.  Go to get a coronary CTA to further evaluate

## 2019-10-23 LAB — BASIC METABOLIC PANEL
BUN/Creatinine Ratio: 15 (ref 12–28)
BUN: 10 mg/dL (ref 8–27)
CO2: 21 mmol/L (ref 20–29)
Calcium: 9.7 mg/dL (ref 8.7–10.3)
Chloride: 104 mmol/L (ref 96–106)
Creatinine, Ser: 0.65 mg/dL (ref 0.57–1.00)
GFR calc Af Amer: 109 mL/min/{1.73_m2} (ref >59)
GFR calc non Af Amer: 94 mL/min/{1.73_m2} (ref >59)
Glucose: 163 mg/dL — ABNORMAL HIGH (ref 65–99)
Potassium: 4.4 mmol/L (ref 3.5–5.2)
Sodium: 144 mmol/L (ref 134–144)

## 2019-10-26 ENCOUNTER — Telehealth (HOSPITAL_COMMUNITY): Payer: Self-pay | Admitting: *Deleted

## 2019-10-26 NOTE — Telephone Encounter (Signed)
Reaching out to patient to offer assistance regarding upcoming cardiac imaging study; pt verbalizes understanding of appt date/time, parking situation and where to check in, pre-test NPO status and medications ordered, and verified current allergies; name and call back number provided for further questions should they arise ° °Merle Tai RN Navigator Cardiac Imaging °[facility] °[phone] office °[phone] cell ° °

## 2019-10-27 DIAGNOSIS — R0789 Other chest pain: Secondary | ICD-10-CM | POA: Diagnosis not present

## 2019-10-28 ENCOUNTER — Other Ambulatory Visit: Payer: Self-pay

## 2019-10-28 ENCOUNTER — Ambulatory Visit (HOSPITAL_COMMUNITY)
Admission: RE | Admit: 2019-10-28 | Discharge: 2019-10-28 | Disposition: A | Discharge status: Home / Self Care | Payer: 59 | Source: Ambulatory Visit | Attending: Cardiovascular Disease | Admitting: Cardiovascular Disease

## 2019-10-28 DIAGNOSIS — R0789 Other chest pain: Secondary | ICD-10-CM

## 2019-10-28 DIAGNOSIS — E1169 Type 2 diabetes mellitus with other specified complication: Secondary | ICD-10-CM | POA: Insufficient documentation

## 2019-10-28 DIAGNOSIS — E785 Hyperlipidemia, unspecified: Secondary | ICD-10-CM | POA: Diagnosis present

## 2019-10-28 MED ORDER — NITROGLYCERIN 0.4 MG SL SUBL
0.8000 mg | SUBLINGUAL_TABLET | Freq: Once | SUBLINGUAL | Status: AC
  Administered 2019-10-28: 0.8 mg via SUBLINGUAL

## 2019-10-28 MED ORDER — IOHEXOL 350 MG/ML SOLN
80.0000 mL | Freq: Once | INTRAVENOUS | Status: AC | PRN
  Administered 2019-10-28: 80 mL via INTRAVENOUS

## 2019-10-28 MED ORDER — NITROGLYCERIN 0.4 MG SL SUBL
SUBLINGUAL_TABLET | SUBLINGUAL | Status: AC
  Filled 2019-10-28: qty 2

## 2019-10-28 NOTE — Progress Notes (Signed)
CT scan completed. Tolerated well. D/C home ambulatory with husband, awake and alert. In no distress 

## 2019-11-01 ENCOUNTER — Emergency Department (HOSPITAL_COMMUNITY): Payer: 59

## 2019-11-01 ENCOUNTER — Other Ambulatory Visit: Payer: Self-pay

## 2019-11-01 ENCOUNTER — Encounter (HOSPITAL_COMMUNITY): Payer: Self-pay | Admitting: Emergency Medicine

## 2019-11-01 ENCOUNTER — Inpatient Hospital Stay (HOSPITAL_COMMUNITY)
Admission: EM | Admit: 2019-11-01 | Discharge: 2019-11-03 | DRG: 872 | Disposition: A | Discharge status: Home / Self Care | Payer: 59 | Attending: Internal Medicine | Admitting: Internal Medicine

## 2019-11-01 DIAGNOSIS — Z20822 Contact with and (suspected) exposure to covid-19: Secondary | ICD-10-CM | POA: Diagnosis present

## 2019-11-01 DIAGNOSIS — B029 Zoster without complications: Secondary | ICD-10-CM | POA: Diagnosis present

## 2019-11-01 DIAGNOSIS — Z807 Family history of other malignant neoplasms of lymphoid, hematopoietic and related tissues: Secondary | ICD-10-CM

## 2019-11-01 DIAGNOSIS — Z8042 Family history of malignant neoplasm of prostate: Secondary | ICD-10-CM

## 2019-11-01 DIAGNOSIS — A419 Sepsis, unspecified organism: Principal | ICD-10-CM | POA: Diagnosis present

## 2019-11-01 DIAGNOSIS — B028 Zoster with other complications: Secondary | ICD-10-CM | POA: Diagnosis not present

## 2019-11-01 DIAGNOSIS — K30 Functional dyspepsia: Secondary | ICD-10-CM | POA: Diagnosis present

## 2019-11-01 DIAGNOSIS — E872 Acidosis: Secondary | ICD-10-CM | POA: Diagnosis present

## 2019-11-01 DIAGNOSIS — I10 Essential (primary) hypertension: Secondary | ICD-10-CM | POA: Diagnosis present

## 2019-11-01 DIAGNOSIS — F419 Anxiety disorder, unspecified: Secondary | ICD-10-CM | POA: Diagnosis present

## 2019-11-01 DIAGNOSIS — Z8543 Personal history of malignant neoplasm of ovary: Secondary | ICD-10-CM | POA: Diagnosis not present

## 2019-11-01 DIAGNOSIS — Z8049 Family history of malignant neoplasm of other genital organs: Secondary | ICD-10-CM | POA: Diagnosis not present

## 2019-11-01 DIAGNOSIS — Z885 Allergy status to narcotic agent status: Secondary | ICD-10-CM

## 2019-11-01 DIAGNOSIS — Z800 Family history of malignant neoplasm of digestive organs: Secondary | ICD-10-CM | POA: Diagnosis not present

## 2019-11-01 DIAGNOSIS — E119 Type 2 diabetes mellitus without complications: Secondary | ICD-10-CM

## 2019-11-01 DIAGNOSIS — Z8542 Personal history of malignant neoplasm of other parts of uterus: Secondary | ICD-10-CM | POA: Diagnosis not present

## 2019-11-01 DIAGNOSIS — Z888 Allergy status to other drugs, medicaments and biological substances status: Secondary | ICD-10-CM

## 2019-11-01 DIAGNOSIS — Z7984 Long term (current) use of oral hypoglycemic drugs: Secondary | ICD-10-CM

## 2019-11-01 DIAGNOSIS — Z801 Family history of malignant neoplasm of trachea, bronchus and lung: Secondary | ICD-10-CM

## 2019-11-01 DIAGNOSIS — R6520 Severe sepsis without septic shock: Secondary | ICD-10-CM

## 2019-11-01 DIAGNOSIS — K219 Gastro-esophageal reflux disease without esophagitis: Secondary | ICD-10-CM | POA: Diagnosis present

## 2019-11-01 DIAGNOSIS — Z8249 Family history of ischemic heart disease and other diseases of the circulatory system: Secondary | ICD-10-CM

## 2019-11-01 DIAGNOSIS — Z90710 Acquired absence of both cervix and uterus: Secondary | ICD-10-CM

## 2019-11-01 DIAGNOSIS — L03115 Cellulitis of right lower limb: Secondary | ICD-10-CM | POA: Diagnosis present

## 2019-11-01 DIAGNOSIS — Z981 Arthrodesis status: Secondary | ICD-10-CM

## 2019-11-01 DIAGNOSIS — Z833 Family history of diabetes mellitus: Secondary | ICD-10-CM

## 2019-11-01 DIAGNOSIS — Z803 Family history of malignant neoplasm of breast: Secondary | ICD-10-CM

## 2019-11-01 DIAGNOSIS — Z8619 Personal history of other infectious and parasitic diseases: Secondary | ICD-10-CM

## 2019-11-01 DIAGNOSIS — I890 Lymphedema, not elsewhere classified: Secondary | ICD-10-CM | POA: Diagnosis present

## 2019-11-01 DIAGNOSIS — E785 Hyperlipidemia, unspecified: Secondary | ICD-10-CM | POA: Diagnosis present

## 2019-11-01 DIAGNOSIS — N179 Acute kidney failure, unspecified: Secondary | ICD-10-CM | POA: Diagnosis present

## 2019-11-01 DIAGNOSIS — Z8041 Family history of malignant neoplasm of ovary: Secondary | ICD-10-CM | POA: Diagnosis not present

## 2019-11-01 DIAGNOSIS — L0390 Cellulitis, unspecified: Secondary | ICD-10-CM | POA: Diagnosis present

## 2019-11-01 DIAGNOSIS — Z79899 Other long term (current) drug therapy: Secondary | ICD-10-CM

## 2019-11-01 LAB — CBC WITH DIFFERENTIAL/PLATELET
Abs Immature Granulocytes: 0.14 10*3/uL — ABNORMAL HIGH (ref 0.00–0.07)
Basophils Absolute: 0.1 10*3/uL (ref 0.0–0.1)
Basophils Relative: 0 %
Eosinophils Absolute: 0 10*3/uL (ref 0.0–0.5)
Eosinophils Relative: 0 %
HCT: 43.8 % (ref 36.0–46.0)
Hemoglobin: 14.7 g/dL (ref 12.0–15.0)
Immature Granulocytes: 1 %
Lymphocytes Relative: 3 %
Lymphs Abs: 0.5 10*3/uL — ABNORMAL LOW (ref 0.7–4.0)
MCH: 29.1 pg (ref 26.0–34.0)
MCHC: 33.6 g/dL (ref 30.0–36.0)
MCV: 86.6 fL (ref 80.0–100.0)
Monocytes Absolute: 0.4 10*3/uL (ref 0.1–1.0)
Monocytes Relative: 2 %
Neutro Abs: 15.9 10*3/uL — ABNORMAL HIGH (ref 1.7–7.7)
Neutrophils Relative %: 94 %
Platelets: 244 10*3/uL (ref 150–400)
RBC: 5.06 MIL/uL (ref 3.87–5.11)
RDW: 13.9 % (ref 11.5–15.5)
WBC: 17 10*3/uL — ABNORMAL HIGH (ref 4.0–10.5)
nRBC: 0 % (ref 0.0–0.2)

## 2019-11-01 LAB — CREATININE, SERUM
Creatinine, Ser: 0.99 mg/dL (ref 0.44–1.00)
GFR calc Af Amer: >60 mL/min (ref >60)
GFR calc non Af Amer: >60 mL/min (ref >60)

## 2019-11-01 LAB — CBG MONITORING, ED: Glucose-Capillary: 192 mg/dL — ABNORMAL HIGH (ref 70–99)

## 2019-11-01 LAB — COMPREHENSIVE METABOLIC PANEL
ALT: 21 U/L (ref 0–44)
AST: 16 U/L (ref 15–41)
Albumin: 3.3 g/dL — ABNORMAL LOW (ref 3.5–5.0)
Alkaline Phosphatase: 101 U/L (ref 38–126)
Anion gap: 12 (ref 5–15)
BUN: 18 mg/dL (ref 8–23)
CO2: 22 mmol/L (ref 22–32)
Calcium: 9 mg/dL (ref 8.9–10.3)
Chloride: 96 mmol/L — ABNORMAL LOW (ref 98–111)
Creatinine, Ser: 1.17 mg/dL — ABNORMAL HIGH (ref 0.44–1.00)
GFR calc Af Amer: 57 mL/min — ABNORMAL LOW (ref >60)
GFR calc non Af Amer: 49 mL/min — ABNORMAL LOW (ref >60)
Glucose, Bld: 258 mg/dL — ABNORMAL HIGH (ref 70–99)
Potassium: 3.5 mmol/L (ref 3.5–5.1)
Sodium: 130 mmol/L — ABNORMAL LOW (ref 135–145)
Total Bilirubin: 1.1 mg/dL (ref 0.3–1.2)
Total Protein: 6.6 g/dL (ref 6.5–8.1)

## 2019-11-01 LAB — URINALYSIS, ROUTINE W REFLEX MICROSCOPIC
Bilirubin Urine: NEGATIVE
Glucose, UA: 50 mg/dL — AB
Ketones, ur: NEGATIVE mg/dL
Nitrite: POSITIVE — AB
Protein, ur: NEGATIVE mg/dL
Specific Gravity, Urine: 1.006 (ref 1.005–1.030)
pH: 5 (ref 5.0–8.0)

## 2019-11-01 LAB — APTT: aPTT: 32 seconds (ref 24–36)

## 2019-11-01 LAB — PROTIME-INR
INR: 1 (ref 0.8–1.2)
Prothrombin Time: 13.1 seconds (ref 11.4–15.2)

## 2019-11-01 LAB — CBC
HCT: 41 % (ref 36.0–46.0)
Hemoglobin: 13.6 g/dL (ref 12.0–15.0)
MCH: 28.9 pg (ref 26.0–34.0)
MCHC: 33.2 g/dL (ref 30.0–36.0)
MCV: 87.2 fL (ref 80.0–100.0)
Platelets: 214 10*3/uL (ref 150–400)
RBC: 4.7 MIL/uL (ref 3.87–5.11)
RDW: 13.8 % (ref 11.5–15.5)
WBC: 11.9 10*3/uL — ABNORMAL HIGH (ref 4.0–10.5)
nRBC: 0 % (ref 0.0–0.2)

## 2019-11-01 LAB — TSH: TSH: 7.639 u[IU]/mL — ABNORMAL HIGH (ref 0.350–4.500)

## 2019-11-01 LAB — LACTIC ACID, PLASMA
Lactic Acid, Venous: 3 mmol/L (ref 0.5–1.9)
Lactic Acid, Venous: 2.5 mmol/L (ref 0.5–1.9)

## 2019-11-01 LAB — MAGNESIUM: Magnesium: 2.1 mg/dL (ref 1.7–2.4)

## 2019-11-01 LAB — PHOSPHORUS: Phosphorus: 1.8 mg/dL — ABNORMAL LOW (ref 2.5–4.6)

## 2019-11-01 MED ORDER — SODIUM CHLORIDE 0.9 % IV SOLN
1.0000 g | Freq: Once | INTRAVENOUS | Status: AC
  Administered 2019-11-01: 1 g via INTRAVENOUS
  Filled 2019-11-01: qty 10

## 2019-11-01 MED ORDER — ONDANSETRON HCL 4 MG/2ML IJ SOLN: 4.0000 mg | Freq: Four times a day (QID) | INTRAMUSCULAR | Status: DC | PRN

## 2019-11-01 MED ORDER — FENTANYL CITRATE (PF) 100 MCG/2ML IJ SOLN: 25.0000 ug | INTRAMUSCULAR | Status: DC | PRN

## 2019-11-01 MED ORDER — INSULIN ASPART 100 UNIT/ML ~~LOC~~ SOLN
0.0000 [IU] | Freq: Three times a day (TID) | SUBCUTANEOUS | Status: DC
  Administered 2019-11-02: 3 [IU] via SUBCUTANEOUS
  Administered 2019-11-02: 2 [IU] via SUBCUTANEOUS
  Administered 2019-11-03: 3 [IU] via SUBCUTANEOUS
  Administered 2019-11-03: 2 [IU] via SUBCUTANEOUS

## 2019-11-01 MED ORDER — SODIUM CHLORIDE 0.9% FLUSH: 3.0000 mL | Freq: Once | INTRAVENOUS | Status: DC

## 2019-11-01 MED ORDER — SODIUM CHLORIDE 0.9 % IV BOLUS
1000.0000 mL | Freq: Once | INTRAVENOUS | Status: AC
  Administered 2019-11-01: 1000 mL via INTRAVENOUS

## 2019-11-01 MED ORDER — ONDANSETRON HCL 4 MG PO TABS
4.0000 mg | ORAL_TABLET | Freq: Four times a day (QID) | ORAL | Status: DC | PRN
  Administered 2019-11-02: 4 mg via ORAL
  Filled 2019-11-01: qty 1

## 2019-11-01 MED ORDER — INSULIN ASPART 100 UNIT/ML ~~LOC~~ SOLN: 0.0000 [IU] | Freq: Every day | SUBCUTANEOUS | Status: DC

## 2019-11-01 MED ORDER — VANCOMYCIN HCL 500 MG/100ML IV SOLN
500.0000 mg | Freq: Two times a day (BID) | INTRAVENOUS | Status: DC
  Administered 2019-11-02 – 2019-11-03 (×3): 500 mg via INTRAVENOUS
  Filled 2019-11-01 (×4): qty 100

## 2019-11-01 MED ORDER — VANCOMYCIN HCL 1250 MG/250ML IV SOLN
1250.0000 mg | Freq: Once | INTRAVENOUS | Status: AC
  Administered 2019-11-02: 1250 mg via INTRAVENOUS
  Filled 2019-11-01: qty 250

## 2019-11-01 MED ORDER — SODIUM CHLORIDE 0.9 % IV SOLN
1.0000 g | INTRAVENOUS | Status: DC
  Administered 2019-11-02: 1 g via INTRAVENOUS
  Filled 2019-11-01: qty 10

## 2019-11-01 MED ORDER — ENOXAPARIN SODIUM 40 MG/0.4ML ~~LOC~~ SOLN
40.0000 mg | SUBCUTANEOUS | Status: DC
  Administered 2019-11-01 – 2019-11-02 (×2): 40 mg via SUBCUTANEOUS
  Filled 2019-11-01 (×2): qty 0.4

## 2019-11-01 NOTE — ED Notes (Signed)
Gave patient [place] sandwich and diet coke per [employer]

## 2019-11-01 NOTE — Progress Notes (Signed)
Pharmacy Antibiotic Note  Jamie [name] is a 64 y.o. female admitted on 11/01/2019 with sepsis and cellulitis.  Pharmacy has been consulted for vancomycin dosing.  Plan: Vancomycin 1250mg  x1 then 500mg  IV every 12 hours.  Goal trough 15-20 mcg/mL.  Height: 5\' 2"  (157.5 cm) Weight: 62.1 kg (137 lb) IBW/kg (Calculated) : 50.1  Temp (24hrs), Avg:98.5 F (36.9 C), Min:97.9 F (36.6 C), Max:99.5 F (37.5 C)  Recent Labs  Lab 11/01/19 1356 11/01/19 1357 11/01/19 1614 11/01/19 2230  WBC  --  17.0*  --  11.9*  CREATININE  --  1.17*  --  0.99  LATICACIDVEN 3.0*  --  2.5*  --     Estimated Creatinine Clearance: 49.8 mL/min (by C-G formula based on SCr of 0.99 mg/dL).    Allergies  Allergen Reactions  . Oxycodone-Acetaminophen Itching, Swelling and Other (See Comments)    Tylox- Face became swollen    Thank you for allowing pharmacy to be a part of this patient's care.  Wynona Neat, PharmD, BCPS  11/01/2019 11:12 PM

## 2019-11-01 NOTE — H&P (Signed)
History and Physical    Jamie [name] Jamie [name] DOB: 1956-04-19 DOA: 11/01/2019  PCP: Marin Olp, MD  Patient coming from: Home  I have personally briefly reviewed patient's old medical records in [facility]  Chief Complaint: Rash on right leg, fever & Chills HPI: Jamie [name] is a 64 y.o. female with medical history significant of well-controlled type 2 diabetes mellitus, hypertension, hyperlipidemia, anxiety, ovarian cancer, right leg lymphedema presents to emergency department due to rash on right leg and fever and chills since 1 week.  Patient tells me that she noticed rash on right thigh on Sunday which progressed to right leg this morning.  Reports subjective fever, chills, nausea, vomiting, generalized weakness, lethargy and decreased appetite as well.  She tells me that since she had hysterectomy due to history of ovarian/endometrial cancer she has right leg lymphedema.  She denies trauma, previous history of similar rash, joint pain, photosensitivity, abdominal pain, insect bite, change in detergent, diet, medicine.  Reports headache however denies blurry vision, lightheadedness, dizziness, chest pain, shortness of breath, palpitation, urinary symptoms.  She lives with her husband at home.  No history of smoking, illicit drug use however drinks alcohol occasionally.  ED Course: Upon arrival to ED: Patient tachycardic, temperature: 99.5, leukocytosis of 17,000, lactic acid: 3.0, CMP shows AKI, sodium 130, PT/INR, blood culture/urine culture: Pending, COVID-19 pending.  Chest x-ray negative.  Patient received Rocephin in ED.  [facility] hospitalist consulted for admission for sepsis secondary to cellulitis.  Review of Systems: As per HPI otherwise negative.    Past Medical History:  Diagnosis Date  . Allergy   . Diabetes mellitus    ORAL MEDS-NO INSULIN  . Endometrial cancer ([place]) 03/2011   03/2011  . GERD (gastroesophageal reflux disease)   . Hiatal  hernia   . History of cervical dysplasia   . Hyperlipidemia   . Malignant neoplasm of body of uterus ([place]) 03/21/2011   S.p hysterectomy  . Ovarian cancer ([place]) 03/2011   03/2011  . PONV (postoperative nausea and vomiting)    ONLY AFTER CERVICAL FUSION--NO PROB WITH OTHER SURGERIES  . Ulcer    history of gastritis    Past Surgical History:  Procedure Laterality Date  . ACHILLES TENDON SURGERY     MULTIPLE SURGERIES BECAUSE OF STAPH INFECTION  . CERVICAL FUSION  09/2003  . GYNECOLOGIC CRYOSURGERY  02/2003  . LUMBAR FUSION  11/1998  . LYMPH NODE DISSECTION  2012  . TOTAL VAGINAL HYSTERECTOMY  03/29/2011     reports that she has never smoked. She has never used smokeless tobacco. No history on file for alcohol use and drug use.  Allergies  Allergen Reactions  . Oxycodone-Acetaminophen Itching and Swelling    Tylox    Family History  Problem Relation Age of Onset  . Diabetes Father   . Heart disease Father   . Prostate cancer Father   . Lymphoma Father   . Lung cancer Mother        smoker  . Ovarian cancer Cousin   . Uterine cancer Cousin   . Breast cancer Maternal Aunt   . Colon cancer Paternal Grandmother 31  . Hemochromatosis Maternal Grandmother   . Diabetes Paternal Grandfather   . Lung cancer Sister   . Healthy Brother     Prior to Admission medications   Medication Sig Start Date End Date Taking? Authorizing Provider  ALPRAZolam Duanne Moron) 0.5 MG tablet Take 1 tablet (0.5 mg total) by mouth at bedtime as needed.  09/08/19   Marin Olp, MD  atorvastatin (LIPITOR) 40 MG tablet Take 1 tablet (40 mg total) by mouth daily. 09/08/19   Marin Olp, MD  benazepril (LOTENSIN) 10 MG tablet TAKE 1 TABLET BY MOUTH DAILY 03/09/19   Marin Olp, MD  benzonatate (TESSALON) 200 MG capsule Take 1 capsule (200 mg total) by mouth 2 (two) times daily as needed for cough. Patient not taking: Reported on 10/28/2019 09/08/19   Marin Olp, MD  cyclobenzaprine  (FLEXERIL) 10 MG tablet cyclobenzaprine 10 mg tablet    [provider]  glimepiride (AMARYL) 2 MG tablet Take 1 tablet (2 mg total) by mouth daily. 09/08/19   Marin Olp, MD  metFORMIN (GLUCOPHAGE) 1000 MG tablet TAKE 1 TABLET BY MOUTH TWICE A DAY WITH MEALS 03/02/19   Marin Olp, MD  metoprolol tartrate (LOPRESSOR) 100 MG tablet Take 1 tablet by mouth once for procedure. 09/23/19   Lorretta Harp, MD  ondansetron (ZOFRAN) 8 MG tablet Take 1 tablet (8 mg total) by mouth every 8 (eight) hours as needed for nausea or vomiting. Patient not taking: Reported on 10/28/2019 06/23/19   Raylene Everts, MD  rosuvastatin (CRESTOR) 40 MG tablet Take 1 tablet (40 mg total) by mouth daily. Patient not taking: Reported on 10/28/2019 09/15/19   Marin Olp, MD  triamcinolone cream (KENALOG) 0.1 % Apply 1 application topically 2 (two) times daily. For 10 days maximum and then take at least 10 day break 10/28/18   Marin Olp, MD    Physical Exam: Vitals:   11/01/19 1610 11/01/19 1616 11/01/19 1617 11/01/19 1618  BP: (!) 126/91 (!) 118/95    Pulse:  (!) 108 89 (!) 111  Resp:      Temp:      TempSrc:      SpO2:  97% 96% 97%    Constitutional: NAD, calm, comfortable, on room air, communicating well Eyes: PERRL, lids and conjunctivae normal ENMT: Mucous membranes are moist. Posterior pharynx clear of any exudate or lesions.Normal dentition.  Neck: normal, supple, no masses, no thyromegaly Respiratory: clear to auscultation bilaterally, no wheezing, no crackles. Normal respiratory effort. No accessory muscle use.  Cardiovascular: Tachycardic, no murmurs / rubs / gallops. No extremity edema. 2+ pedal pulses. No carotid bruits.  Abdomen: no tenderness, no masses palpated. No hepatosplenomegaly. Bowel sounds positive.  Musculoskeletal:       Neurologic: CN 2-12 grossly intact. Sensation intact, DTR normal. Strength 5/5 in all 4.  Psychiatric: Normal judgment and  insight. Alert and oriented x 3. Normal mood.    Labs on Admission: I have personally reviewed following labs and imaging studies  CBC: Recent Labs  Lab 11/01/19 1357  WBC 17.0*  NEUTROABS 15.9*  HGB 14.7  HCT 43.8  MCV 86.6  PLT 761   Basic Metabolic Panel: Recent Labs  Lab 11/01/19 1357  NA 130*  K 3.5  CL 96*  CO2 22  GLUCOSE 258*  BUN 18  CREATININE 1.17*  CALCIUM 9.0   GFR: CrCl cannot be calculated (Unknown ideal weight.). Liver Function Tests: Recent Labs  Lab 11/01/19 1357  AST 16  ALT 21  ALKPHOS 101  BILITOT 1.1  PROT 6.6  ALBUMIN 3.3*   No results for input(s): LIPASE, AMYLASE in the last 168 hours. No results for input(s): AMMONIA in the last 168 hours. Coagulation Profile: Recent Labs  Lab 11/01/19 1735  INR 1.0   Cardiac Enzymes: No results for input(s): CKTOTAL, CKMB,  CKMBINDEX, TROPONINI in the last 168 hours. BNP (last 3 results) No results for input(s): PROBNP in the last 8760 hours. HbA1C: No results for input(s): HGBA1C in the last 72 hours. CBG: No results for input(s): GLUCAP in the last 168 hours. Lipid Profile: No results for input(s): CHOL, HDL, LDLCALC, TRIG, CHOLHDL, LDLDIRECT in the last 72 hours. Thyroid Function Tests: No results for input(s): TSH, T4TOTAL, FREET4, T3FREE, THYROIDAB in the last 72 hours. Anemia Panel: No results for input(s): VITAMINB12, FOLATE, FERRITIN, TIBC, IRON, RETICCTPCT in the last 72 hours. Urine analysis:    Component Value Date/Time   COLORURINE YELLOW 08/29/2009 1642   APPEARANCEUR CLEAR 08/29/2009 1642   LABSPEC 1.025 08/29/2009 1642   PHURINE 5.5 08/29/2009 1642   GLUCOSEU NEGATIVE 08/29/2009 1642   GLUCOSEU NEGATIVE 07/22/2007 1547   HGBUR NEGATIVE 08/29/2009 1642   BILIRUBINUR N 03/27/2018 0934   KETONESUR 15 (A) 08/29/2009 1642   PROTEINUR Negative 03/27/2018 0934   PROTEINUR NEGATIVE 08/29/2009 1642   UROBILINOGEN 0.2 03/27/2018 0934   UROBILINOGEN 0.2 08/29/2009 1642    NITRITE N 03/27/2018 0934   NITRITE NEGATIVE 08/29/2009 1642   LEUKOCYTESUR Negative 03/27/2018 0934    Radiological Exams on Admission: DG Chest Port 1 View  Result Date: 11/01/2019 CLINICAL DATA:  Sepsis EXAM: PORTABLE CHEST 1 VIEW COMPARISON:  06/27/2019 FINDINGS: The heart size and mediastinal contours are within normal limits. Both lungs are clear. The visualized skeletal structures are unremarkable. IMPRESSION: No active disease. Electronically Signed   By: Constance Holster M.D.   On: 11/01/2019 17:14    EKG: Independently reviewed.  Sinus tachycardia, no ST elevation or depression noted.  Assessment/Plan Principal Problem:   Sepsis ([place]) Active Problems:   GERD   Diabetes mellitus, type 2 (HCC)   Rash   Hyperlipidemia   AKI (acute kidney injury) ([place])    Sepsis secondary to right leg cellulitis: -Patient presented with rash on right leg, tachycardia, leukocytosis of 17,000, lactic acid: 3.0, AKI. -Blood culture obtained and is pending.  PT/INR: Pending -Patient received Rocephin in ED. -Admit patient to stepdown unit for close monitoring. -Continue Rocephin, start on IV vancomycin for MRSA coverage. -Continue IV fluids. -Trend lactic acid.  Check procalcitonin level -Monitor vitals closely.  Diabetes mellitus: -Check A1c.  Hold Metformin due to lactic acidosis and Amaryl -Started on sliding scale insulin and monitor blood sugar closely  Hypertension: Blood pressure is on lower side -Hold benazepril and metoprolol for now -Monitor blood pressure closely.  AKI: -Hold nephrotoxic medication benazepril and Metformin -Continue IV fluids.  Repeat BMP tomorrow a.m.  Hyperlipidemia: Continue statin  History of endometrial/ovarian cancer: Status post hysterectomy -Has chronic right leg lymphedema.  Wears compression stockings at home  DVT prophylaxis: Lovenox/TED Code Status: Full code Family Communication: Patient's husband present at bedside.  Plan of care  discussed with patient and her husband at bedside in length and they verbalized understanding and agreed with it. Disposition Plan: Likely home in 2 to 3 days Consults called: None Admission status: Inpatient   Mckinley Jewel MD [facility]  If 7PM-7AM, please contact night-coverage [email or web address] Password [facility]  11/01/2019, 6:07 PM

## 2019-11-01 NOTE — ED Provider Notes (Signed)
[facility] EMERGENCY DEPARTMENT Provider Note   CSN: [id number] Arrival date & time: 11/01/19  1331     History Chief Complaint  Patient presents with  . Leg Pain    Jamie [name] is a 64 y.o. female.  HPI   This patient is a pleasant 64 year old female with a history of diabetes, endometrial cancer, hyperlipidemia as well as ovarian cancer.  She presents to the hospital with a complaint of right leg swelling and redness with fevers, vomiting and some stomach upset.  The vomiting seems to have resolved but she continues to have pain in the right leg and noticed over the last couple of days a progressive rash which started towards the groin and is moved distally towards her foot.  She has been known to have some lymphedema in that leg, she wears occasional compression stockings, she has been able to eat and drink today, denies urinary symptoms or diarrhea, denies coughing or shortness of breath.  Symptoms have been persistent gradually worsening and became severe today prompting her visit.  No recent antibiotics, no recent travel, no recent tick bites  The patient did notice that she had a spontaneous nosebleed last night that stopped spontaneously  Past Medical History:  Diagnosis Date  . Allergy   . Diabetes mellitus    ORAL MEDS-NO INSULIN  . Endometrial cancer ([place]) 03/2011   03/2011  . GERD (gastroesophageal reflux disease)   . Hiatal hernia   . History of cervical dysplasia   . Hyperlipidemia   . Malignant neoplasm of body of uterus ([place]) 03/21/2011   S.p hysterectomy  . Ovarian cancer ([place]) 03/2011   03/2011  . PONV (postoperative nausea and vomiting)    ONLY AFTER CERVICAL FUSION--NO PROB WITH OTHER SURGERIES  . Ulcer    history of gastritis    Patient Active Problem List   Diagnosis Date Noted  . Bunion of great toe 09/08/2019  . History of endometrial cancer 05/12/2019  . Rash 10/28/2018  . History of ovarian cancer 03/27/2018  .  Abdominal pain 05/22/2012  . Lymphedema of leg 08/27/2011  . Nonspecific elevation of levels of transaminase or lactic acid dehydrogenase (LDH) 08/03/2010  . Diabetes mellitus, type 2 ([place]) 04/25/2007  . Hyperlipidemia associated with type 2 diabetes mellitus ([place]) 01/27/2007  . Hypertension associated with diabetes ([place]) 01/27/2007  . Allergic rhinitis 01/27/2007  . GERD 01/27/2007  . MENOPAUSE-RELATED VASOMOTOR SYMPTOMS, HOT FLASHES 01/27/2007  . Fibromyalgia 01/27/2007  . Atypical chest pain 01/27/2007    Past Surgical History:  Procedure Laterality Date  . ACHILLES TENDON SURGERY     MULTIPLE SURGERIES BECAUSE OF STAPH INFECTION  . CERVICAL FUSION  09/2003  . GYNECOLOGIC CRYOSURGERY  02/2003  . LUMBAR FUSION  11/1998  . LYMPH NODE DISSECTION  2012  . TOTAL VAGINAL HYSTERECTOMY  03/29/2011     OB History   No obstetric history on file.     Family History  Problem Relation Age of Onset  . Diabetes Father   . Heart disease Father   . Prostate cancer Father   . Lymphoma Father   . Lung cancer Mother        smoker  . Ovarian cancer Cousin   . Uterine cancer Cousin   . Breast cancer Maternal Aunt   . Colon cancer Paternal Grandmother 65  . Hemochromatosis Maternal Grandmother   . Diabetes Paternal Grandfather   . Lung cancer Sister   . Healthy Brother     Social History  Tobacco Use  . Smoking status: Never Smoker  . Smokeless tobacco: Never Used  Substance Use Topics  . Alcohol use: Not on file    Comment: once every 2-3 months  . Drug use: Not on file    Home Medications Prior to Admission medications   Medication Sig Start Date End Date Taking? Authorizing Provider  ALPRAZolam Duanne Moron) 0.5 MG tablet Take 1 tablet (0.5 mg total) by mouth at bedtime as needed. 09/08/19   Marin Olp, MD  atorvastatin (LIPITOR) 40 MG tablet Take 1 tablet (40 mg total) by mouth daily. 09/08/19   Marin Olp, MD  benazepril (LOTENSIN) 10 MG tablet TAKE 1 TABLET BY  MOUTH DAILY 03/09/19   Marin Olp, MD  benzonatate (TESSALON) 200 MG capsule Take 1 capsule (200 mg total) by mouth 2 (two) times daily as needed for cough. Patient not taking: Reported on 10/28/2019 09/08/19   Marin Olp, MD  cyclobenzaprine (FLEXERIL) 10 MG tablet cyclobenzaprine 10 mg tablet    [provider]  glimepiride (AMARYL) 2 MG tablet Take 1 tablet (2 mg total) by mouth daily. 09/08/19   Marin Olp, MD  metFORMIN (GLUCOPHAGE) 1000 MG tablet TAKE 1 TABLET BY MOUTH TWICE A DAY WITH MEALS 03/02/19   Marin Olp, MD  metoprolol tartrate (LOPRESSOR) 100 MG tablet Take 1 tablet by mouth once for procedure. 09/23/19   Lorretta Harp, MD  ondansetron (ZOFRAN) 8 MG tablet Take 1 tablet (8 mg total) by mouth every 8 (eight) hours as needed for nausea or vomiting. Patient not taking: Reported on 10/28/2019 06/23/19   Raylene Everts, MD  rosuvastatin (CRESTOR) 40 MG tablet Take 1 tablet (40 mg total) by mouth daily. Patient not taking: Reported on 10/28/2019 09/15/19   Marin Olp, MD  triamcinolone cream (KENALOG) 0.1 % Apply 1 application topically 2 (two) times daily. For 10 days maximum and then take at least 10 day break 10/28/18   Marin Olp, MD    Allergies    Oxycodone-acetaminophen  Review of Systems   Review of Systems  All other systems reviewed and are negative.   Physical Exam Updated Vital Signs BP (!) 118/95   Pulse (!) 111   Temp 99.5 F (37.5 C) (Oral)   Resp 20   LMP  (LMP Unknown)   SpO2 97%   Physical Exam Vitals and nursing note reviewed.  Constitutional:      General: She is not in acute distress.    Appearance: She is well-developed.  HENT:     Head: Normocephalic and atraumatic.     Mouth/Throat:     Pharynx: No oropharyngeal exudate.  Eyes:     General: No scleral icterus.       Right eye: No discharge.        Left eye: No discharge.     Conjunctiva/sclera: Conjunctivae normal.     Pupils: Pupils are  equal, round, and reactive to light.  Neck:     Thyroid: No thyromegaly.     Vascular: No JVD.  Cardiovascular:     Rate and Rhythm: Regular rhythm. Tachycardia present.     Heart sounds: Normal heart sounds. No murmur heard.  No friction rub. No gallop.   Pulmonary:     Effort: Pulmonary effort is normal. No respiratory distress.     Breath sounds: Normal breath sounds. No wheezing or rales.  Abdominal:     General: Bowel sounds are normal. There is no distension.  Palpations: Abdomen is soft. There is no mass.     Tenderness: There is abdominal tenderness.  Musculoskeletal:        General: Swelling and tenderness present. No deformity or signs of injury. Normal range of motion.     Cervical back: Normal range of motion and neck supple.     Right lower leg: Edema present.     Left lower leg: No edema.  Lymphadenopathy:     Cervical: No cervical adenopathy.  Skin:    General: Skin is warm and dry.     Findings: Erythema and rash present.  Neurological:     Mental Status: She is alert.     Coordination: Coordination normal.     Comments: The patient is normal neurologically with speech gait coordination and strength  Psychiatric:        Behavior: Behavior normal.             ED Results / Procedures / Treatments   Labs (all labs ordered are listed, but only abnormal results are displayed) Labs Reviewed  LACTIC ACID, PLASMA - Abnormal; Notable for the following components:      Result Value   Lactic Acid, Venous 3.0 (*)    All other components within normal limits  LACTIC ACID, PLASMA - Abnormal; Notable for the following components:   Lactic Acid, Venous 2.5 (*)    All other components within normal limits  COMPREHENSIVE METABOLIC PANEL - Abnormal; Notable for the following components:   Sodium 130 (*)    Chloride 96 (*)    Glucose, Bld 258 (*)    Creatinine, Ser 1.17 (*)    Albumin 3.3 (*)    GFR calc non Af Amer 49 (*)    GFR calc Af Amer 57 (*)    All  other components within normal limits  CBC WITH DIFFERENTIAL/PLATELET - Abnormal; Notable for the following components:   WBC 17.0 (*)    Neutro Abs 15.9 (*)    Lymphs Abs 0.5 (*)    Abs Immature Granulocytes 0.14 (*)    All other components within normal limits  CULTURE, BLOOD (ROUTINE X 2)  CULTURE, BLOOD (ROUTINE X 2)  URINE CULTURE  URINALYSIS, ROUTINE W REFLEX MICROSCOPIC  APTT  PROTIME-INR    EKG EKG Interpretation  Date/Time:  Sunday November 01 2019 16:46:28 EDT Ventricular Rate:  101 PR Interval:    QRS Duration: 93 QT Interval:  340 QTC Calculation: 441 R Axis:   61 Text Interpretation: Sinus tachycardia Probable left atrial enlargement Probable anteroseptal infarct, old Since last tracing rate faster Confirmed by Noemi Chapel [phone]) on 11/01/2019 4:51:08 PM   Radiology DG Chest Port 1 View  Result Date: 11/01/2019 CLINICAL DATA:  Sepsis EXAM: PORTABLE CHEST 1 VIEW COMPARISON:  06/27/2019 FINDINGS: The heart size and mediastinal contours are within normal limits. Both lungs are clear. The visualized skeletal structures are unremarkable. IMPRESSION: No active disease. Electronically Signed   By: Constance Holster M.D.   On: 11/01/2019 17:14    Procedures .Critical Care Performed by: Noemi Chapel, MD Authorized by: Noemi Chapel, MD   Critical care provider statement:    Critical care time (minutes):  35   Critical care time was exclusive of:  Separately billable procedures and treating other patients and teaching time   Critical care was necessary to treat or prevent imminent or life-threatening deterioration of the following conditions:  Sepsis   Critical care was time spent personally by me on the following activities:  Blood  draw for specimens, development of treatment plan with patient or surrogate, discussions with consultants, evaluation of patient's response to treatment, examination of patient, obtaining history from patient or surrogate, ordering and  performing treatments and interventions, ordering and review of laboratory studies, ordering and review of radiographic studies, pulse oximetry, re-evaluation of patient's condition and review of old charts   (including critical care time)  Medications Ordered in ED Medications  sodium chloride flush (NS) 0.9 % injection 3 mL (3 mLs Intravenous Not Given 11/01/19 1747)  cefTRIAXone (ROCEPHIN) 1 g in sodium chloride 0.9 % 100 mL IVPB (1 g Intravenous New Bag/Given 11/01/19 1743)  sodium chloride 0.9 % bolus 1,000 mL (1,000 mLs Intravenous New Bag/Given 11/01/19 1745)    ED Course  I have reviewed the triage vital signs and the nursing notes.  Pertinent labs & imaging results that were available during my care of the patient were reviewed by me and considered in my medical decision making (see chart for details).    MDM Rules/Calculators/A&P                          At this time there appears to be a rash which has multiple different components some of which appear somewhat cellulitic some of which are purpuric or petechial, using reading glasses it is clear that it does not blanch, there does appear to be some tenderness to it, there is no lymphadenopathy in the right inguinal space, she does have some abdominal discomfort on exam but nothing focal  Overall she is borderline febrile, she is tachycardic, she has a leukocytosis of 17,000 and a lactic acid of 3 all suggestive of an underlying more aggressive process, we will treat as if this is sepsis from a soft tissue infection, blood cultures will be obtained and IV fluids given the patient will be given Rocephin to cover for staphylococcal species and strep species gram-positive.  There is no purulent collections.  Additionally this patient could have a vasculitis, this could be a primary hematologic disorder as well given her nosebleed last night, INR will be checked.  Glucose is elevated at 258, albumin is slightly low and sodium is also low at  130.  Thankfully platelets and hemoglobin are both normal  The patient will need to be admitted to the hospital to high level of care, she is critically ill with presumed sepsis.  Discussed with the hospitalist Dr. Doristine Bosworth who will admit the patient in the hospital, appreciate her collegial care  Final Clinical Impression(s) / ED Diagnoses Final diagnoses:  Sepsis, due to unspecified organism, unspecified whether acute organ dysfunction present [facility])    Rx / DC Orders ED Discharge Orders    None       Noemi Chapel, MD 11/01/19 1801

## 2019-11-01 NOTE — ED Triage Notes (Signed)
Pt reports vomiting and chills that started on Friday.  No vomiting today.  R leg redness and pain since Saturday.

## 2019-11-02 ENCOUNTER — Encounter (HOSPITAL_COMMUNITY): Payer: Self-pay | Admitting: Internal Medicine

## 2019-11-02 DIAGNOSIS — E785 Hyperlipidemia, unspecified: Secondary | ICD-10-CM

## 2019-11-02 DIAGNOSIS — E119 Type 2 diabetes mellitus without complications: Secondary | ICD-10-CM

## 2019-11-02 DIAGNOSIS — B028 Zoster with other complications: Secondary | ICD-10-CM

## 2019-11-02 LAB — PROTIME-INR
INR: 1.1 (ref 0.8–1.2)
Prothrombin Time: 13.7 seconds (ref 11.4–15.2)

## 2019-11-02 LAB — COMPREHENSIVE METABOLIC PANEL
ALT: 16 U/L (ref 0–44)
AST: 13 U/L — ABNORMAL LOW (ref 15–41)
Albumin: 2.6 g/dL — ABNORMAL LOW (ref 3.5–5.0)
Alkaline Phosphatase: 78 U/L (ref 38–126)
Anion gap: 10 (ref 5–15)
BUN: 12 mg/dL (ref 8–23)
CO2: 21 mmol/L — ABNORMAL LOW (ref 22–32)
Calcium: 8.5 mg/dL — ABNORMAL LOW (ref 8.9–10.3)
Chloride: 104 mmol/L (ref 98–111)
Creatinine, Ser: 0.89 mg/dL (ref 0.44–1.00)
GFR calc Af Amer: >60 mL/min (ref >60)
GFR calc non Af Amer: >60 mL/min (ref >60)
Glucose, Bld: 174 mg/dL — ABNORMAL HIGH (ref 70–99)
Potassium: 3.2 mmol/L — ABNORMAL LOW (ref 3.5–5.1)
Sodium: 135 mmol/L (ref 135–145)
Total Bilirubin: 0.7 mg/dL (ref 0.3–1.2)
Total Protein: 5.6 g/dL — ABNORMAL LOW (ref 6.5–8.1)

## 2019-11-02 LAB — CBC
HCT: 35.8 % — ABNORMAL LOW (ref 36.0–46.0)
Hemoglobin: 12.1 g/dL (ref 12.0–15.0)
MCH: 28.9 pg (ref 26.0–34.0)
MCHC: 33.8 g/dL (ref 30.0–36.0)
MCV: 85.6 fL (ref 80.0–100.0)
Platelets: 191 10*3/uL (ref 150–400)
RBC: 4.18 MIL/uL (ref 3.87–5.11)
RDW: 13.7 % (ref 11.5–15.5)
WBC: 10.9 10*3/uL — ABNORMAL HIGH (ref 4.0–10.5)
nRBC: 0 % (ref 0.0–0.2)

## 2019-11-02 LAB — PROCALCITONIN: Procalcitonin: 3.86 ng/mL

## 2019-11-02 LAB — GLUCOSE, CAPILLARY
Glucose-Capillary: 130 mg/dL — ABNORMAL HIGH (ref 70–99)
Glucose-Capillary: 153 mg/dL — ABNORMAL HIGH (ref 70–99)
Glucose-Capillary: 99 mg/dL (ref 70–99)
Glucose-Capillary: 114 mg/dL — ABNORMAL HIGH (ref 70–99)

## 2019-11-02 LAB — LACTIC ACID, PLASMA: Lactic Acid, Venous: 1.6 mmol/L (ref 0.5–1.9)

## 2019-11-02 LAB — SARS CORONAVIRUS 2 BY RT PCR (HOSPITAL ORDER, PERFORMED IN ~~LOC~~ HOSPITAL LAB): SARS Coronavirus 2: NEGATIVE

## 2019-11-02 LAB — HIV ANTIBODY (ROUTINE TESTING W REFLEX): HIV Screen 4th Generation wRfx: NONREACTIVE

## 2019-11-02 LAB — CORTISOL-AM, BLOOD: Cortisol - AM: 12.9 ug/dL (ref 6.7–22.6)

## 2019-11-02 MED ORDER — VALACYCLOVIR HCL 500 MG PO TABS
1000.0000 mg | ORAL_TABLET | Freq: Three times a day (TID) | ORAL | Status: DC
  Administered 2019-11-02 – 2019-11-03 (×4): 1000 mg via ORAL
  Filled 2019-11-02 (×6): qty 2

## 2019-11-02 MED ORDER — ATORVASTATIN CALCIUM 40 MG PO TABS
40.0000 mg | ORAL_TABLET | Freq: Every day | ORAL | Status: DC
  Administered 2019-11-02: 40 mg via ORAL
  Filled 2019-11-02: qty 1

## 2019-11-02 MED ORDER — ACETAMINOPHEN 325 MG PO TABS
650.0000 mg | ORAL_TABLET | Freq: Four times a day (QID) | ORAL | Status: DC | PRN
  Administered 2019-11-02 – 2019-11-03 (×3): 650 mg via ORAL
  Filled 2019-11-02 (×3): qty 2

## 2019-11-02 MED ORDER — POTASSIUM CHLORIDE CRYS ER 20 MEQ PO TBCR
40.0000 meq | EXTENDED_RELEASE_TABLET | ORAL | Status: AC
  Administered 2019-11-02 (×2): 40 meq via ORAL
  Filled 2019-11-02 (×2): qty 2

## 2019-11-02 NOTE — Progress Notes (Signed)
PROGRESS NOTE  Jamie [name] CXK:[id number] DOB: January 08, 1956 DOA: 11/01/2019 PCP: Marin Olp, MD   LOS: 1 day   Brief Narrative / Interim history: 64 year old female with history of well-controlled type 2 diabetes mellitus, hypertension, hyperlipidemia, ovarian cancer status post hysterectomy with a degree of right leg lymphedema, came to the hospital and was admitted with rash on the right leg, fever and chills.  Her rash appeared since Sunday.  She had a leukocytosis with a white count of 17, had a temp of 99.5, she was tachycardic in the low 120s.  She was placed on vancomycin and ceftriaxone and was admitted to the hospital  Subjective / 24h Interval events: She is doing better this morning, but does not appreciate any improvement in her rash.  Even states that there is a new area lateral to her right ankle that appears to be right now.  Reports burning sensation throughout the rash but especially around the knee.  Assessment & Plan: Principal Problem SIRS with rash, cellulitis versus shingles-patient was admitted and initiated on treatment for cellulitis with vancomycin and ceftriaxone.  Rash is somewhat unusual for cellulitic rash but cannot be fully excluded.  This morning she is developing some vesicular lesions on her right knee as well as right inner thigh raising concern for viral infection/shingles.  She has a history of shingles and she remembers it being on the right buttock few years back.  I will add Valtrex, maintain antibiotics and closely monitor.  Her leukocytosis has improved, fever curve is better and tachycardia improved.  Active Problems Type 2 diabetes mellitus - hold Metformin and Amaryl, placed on sliding scale, most recent A1c last month was 6.5.  Essential hypertension-hold home benazepril   Hyperlipidemia-resume atorvastatin  History of endometrial/ovarian cancer-status post hysterectomy   Scheduled Meds: . enoxaparin (LOVENOX) injection  40 mg  Subcutaneous Q24H  . insulin aspart  0-15 Units Subcutaneous TID WC  . insulin aspart  0-5 Units Subcutaneous QHS  . potassium chloride  40 mEq Oral Q3H  . sodium chloride flush  3 mL Intravenous Once  . valACYclovir  1,000 mg Oral TID   Continuous Infusions: . cefTRIAXone (ROCEPHIN)  IV    . vancomycin     PRN Meds:.acetaminophen, fentaNYL (SUBLIMAZE) injection, ondansetron **OR** ondansetron (ZOFRAN) IV  Diet Orders (From admission, onward)    Start     Ordered   11/01/19 1805  Diet Carb Modified Fluid consistency: Thin; Room service appropriate? Yes  Diet effective now       Question Answer Comment  Diet-HS Snack? Nothing   Calorie Level Medium 1600-2000   Fluid consistency: Thin   Room service appropriate? Yes      06 /27/21 1805          DVT prophylaxis: enoxaparin (LOVENOX) injection 40 mg Start: 11/01/19 2200 Place TED hose Start: 11/01/19 1802     Code Status: Full Code  Family Communication: no family at bedside   Status is: Inpatient  Remains inpatient appropriate because:Cellulitis/moderate to severe shingles involving more than 1 dermatome  Dispo:  Patient From: Home  Planned Disposition: Home  Expected discharge date: 11/04/19  Medically stable for discharge: No  Consultants:  None   Procedures:  None   Microbiology  None   Antimicrobials: Vancomycin / Ceftriaxone 6/27 >> Valtrex 6/28 >>    Objective: Vitals:   11/01/19 2101 11/01/19 2230 11/02/19 0132 11/02/19 0747  BP: 127/62 140/69 (!) 148/82 118/77  Pulse: (!) 109 98 (!) 120   Resp: Marland Kitchen)  22 16 17 16   Temp:   99.9 F (37.7 C) 98.2 F (36.8 C)  TempSrc:   Oral Oral  SpO2: 99% 98% 95% 98%  Weight:   61.4 kg   Height:        Intake/Output Summary (Last 24 hours) at 11/02/2019 0846 Last data filed at 11/02/2019 0141 Gross per 24 hour  Intake 1100 ml  Output 250 ml  Net 850 ml   Filed Weights   11/01/19 1945 11/02/19 0132  Weight: 62.1 kg 61.4 kg     Examination:  Constitutional: NAD Eyes: no scleral icterus ENMT: Mucous membranes are moist.  Neck: normal, supple Respiratory: clear to auscultation bilaterally, no wheezing, no crackles. Normal respiratory effort. No accessory muscle use.  Cardiovascular: Regular rate and rhythm, no murmurs / rubs / gallops. No LE edema. Good peripheral pulses Abdomen: non distended, no tenderness. Bowel sounds positive.  Musculoskeletal: no clubbing / cyanosis.  Skin: Diffuse rash on the right lower extremity with emerging vesicles, see below Neurologic: CN 2-12 grossly intact. Strength 5/5 in all 4.  Psychiatric: Normal judgment and insight. Alert and oriented x 3. Normal mood.            Data Reviewed: I have independently reviewed following labs and imaging studies   CBC: Recent Labs  Lab 11/01/19 1357 11/01/19 2230 11/02/19 0241  WBC 17.0* 11.9* 10.9*  NEUTROABS 15.9*  --   --   HGB 14.7 13.6 12.1  HCT 43.8 41.0 35.8*  MCV 86.6 87.2 85.6  PLT 244 214 025   Basic Metabolic Panel: Recent Labs  Lab 11/01/19 1357 11/01/19 2230 11/02/19 0241  NA 130*  --  135  K 3.5  --  3.2*  CL 96*  --  104  CO2 22  --  21*  GLUCOSE 258*  --  174*  BUN 18  --  12  CREATININE 1.17* 0.99 0.89  CALCIUM 9.0  --  8.5*  MG  --  2.1  --   PHOS  --  1.8*  --    Liver Function Tests: Recent Labs  Lab 11/01/19 1357 11/02/19 0241  AST 16 13*  ALT 21 16  ALKPHOS 101 78  BILITOT 1.1 0.7  PROT 6.6 5.6*  ALBUMIN 3.3* 2.6*   Coagulation Profile: Recent Labs  Lab 11/01/19 1735 11/02/19 0241  INR 1.0 1.1   HbA1C: No results for input(s): HGBA1C in the last 72 hours. CBG: Recent Labs  Lab 11/01/19 2146  GLUCAP 192*    Recent Results (from the past 240 hour(s))  SARS Coronavirus 2 by RT PCR (hospital order, performed in [facility] lab) Nasopharyngeal Nasopharyngeal Swab     Status: None   Collection Time: 11/01/19 10:34 PM   Specimen: Nasopharyngeal Swab  Result  Value Ref Range Status   SARS Coronavirus 2 NEGATIVE NEGATIVE Final    Comment: (NOTE) SARS-CoV-2 target nucleic acids are NOT DETECTED.  The SARS-CoV-2 RNA is generally detectable in upper and lower respiratory specimens during the acute phase of infection. The lowest concentration of SARS-CoV-2 viral copies this assay can detect is 250 copies / mL. A negative result does not preclude SARS-CoV-2 infection and should not be used as the sole basis for treatment or other patient management decisions.  A negative result may occur with improper specimen collection / handling, submission of specimen other than nasopharyngeal swab, presence of viral mutation(s) within the areas targeted by this assay, and inadequate number of viral copies (<250 copies / mL).  A negative result must be combined with clinical observations, patient history, and epidemiological information.  Fact Sheet for Patients:   StrictlyIdeas.no  Fact Sheet for Healthcare Providers: BankingDealers.co.za  This test is not yet approved or  cleared by the [place] FDA and has been authorized for detection and/or diagnosis of SARS-CoV-2 by FDA under an Emergency Use Authorization (EUA).  This EUA will remain in effect (meaning this test can be used) for the duration of the COVID-19 declaration under Section 564(b)(1) of the Act, 21 U.S.C. section 360bbb-3(b)(1), unless the authorization is terminated or revoked sooner.  Performed at [facility], [place] [address] [place], [place] [address]      Radiology Studies: DG Chest Port 1 View  Result Date: 11/01/2019 CLINICAL DATA:  Sepsis EXAM: PORTABLE CHEST 1 VIEW COMPARISON:  06/27/2019 FINDINGS: The heart size and mediastinal contours are within normal limits. Both lungs are clear. The visualized skeletal structures are unremarkable. IMPRESSION: No active disease. Electronically Signed   By: Constance Holster M.D.    On: 11/01/2019 17:14   Marzetta Board, MD, PhD [facility]  Between 7 am - 7 pm I am available, please contact me via Amion or Securechat  Between 7 pm - 7 am I am not available, please contact night coverage MD/APP via Amion

## 2019-11-02 NOTE — Progress Notes (Signed)
Patient arrived from the ED. Alert and orientedx4. C/o of any pain,, states just "Burning" sensation to right leg. Oriented patient to unit and procedures. VSS. IV vanc infusing. No acute needs voiced at this time, call bell in reach.

## 2019-11-03 DIAGNOSIS — L03115 Cellulitis of right lower limb: Secondary | ICD-10-CM

## 2019-11-03 LAB — COMPREHENSIVE METABOLIC PANEL
ALT: 35 U/L (ref 0–44)
AST: 32 U/L (ref 15–41)
Albumin: 2.4 g/dL — ABNORMAL LOW (ref 3.5–5.0)
Alkaline Phosphatase: 134 U/L — ABNORMAL HIGH (ref 38–126)
Anion gap: 8 (ref 5–15)
BUN: 8 mg/dL (ref 8–23)
CO2: 22 mmol/L (ref 22–32)
Calcium: 8.6 mg/dL — ABNORMAL LOW (ref 8.9–10.3)
Chloride: 109 mmol/L (ref 98–111)
Creatinine, Ser: 0.87 mg/dL (ref 0.44–1.00)
GFR calc Af Amer: >60 mL/min (ref >60)
GFR calc non Af Amer: >60 mL/min (ref >60)
Glucose, Bld: 124 mg/dL — ABNORMAL HIGH (ref 70–99)
Potassium: 4.5 mmol/L (ref 3.5–5.1)
Sodium: 139 mmol/L (ref 135–145)
Total Bilirubin: 0.6 mg/dL (ref 0.3–1.2)
Total Protein: 5.3 g/dL — ABNORMAL LOW (ref 6.5–8.1)

## 2019-11-03 LAB — GLUCOSE, CAPILLARY
Glucose-Capillary: 126 mg/dL — ABNORMAL HIGH (ref 70–99)
Glucose-Capillary: 138 mg/dL — ABNORMAL HIGH (ref 70–99)
Glucose-Capillary: 157 mg/dL — ABNORMAL HIGH (ref 70–99)

## 2019-11-03 LAB — CBC
HCT: 36.3 % (ref 36.0–46.0)
Hemoglobin: 12 g/dL (ref 12.0–15.0)
MCH: 29 pg (ref 26.0–34.0)
MCHC: 33.1 g/dL (ref 30.0–36.0)
MCV: 87.7 fL (ref 80.0–100.0)
Platelets: 230 10*3/uL (ref 150–400)
RBC: 4.14 MIL/uL (ref 3.87–5.11)
RDW: 14.2 % (ref 11.5–15.5)
WBC: 8.1 10*3/uL (ref 4.0–10.5)
nRBC: 0 % (ref 0.0–0.2)

## 2019-11-03 LAB — URINE CULTURE

## 2019-11-03 MED ORDER — CEPHALEXIN 500 MG PO CAPS: 500.0000 mg | ORAL_CAPSULE | Freq: Four times a day (QID) | ORAL | 0 refills | Status: AC

## 2019-11-03 MED ORDER — VALACYCLOVIR HCL 1 G PO TABS: 1000.0000 mg | ORAL_TABLET | Freq: Three times a day (TID) | ORAL | 0 refills | Status: AC

## 2019-11-03 NOTE — Discharge Instructions (Signed)
Follow with Marin Olp, MD in 5-7 days  Please get a complete blood count and chemistry panel checked by your Primary MD at your next visit, and again as instructed by your Primary MD. Please get your medications reviewed and adjusted by your Primary MD.  Please request your Primary MD to go over all Hospital Tests and Procedure/Radiological results at the follow up, please get all Hospital records sent to your Prim MD by signing hospital release before you go home.  In some cases, there will be blood work, cultures and biopsy results pending at the time of your discharge. Please request that your primary care M.D. goes through all the records of your hospital data and follows up on these results.  If you had Pneumonia of Lung problems at the Hospital: Please get a 2 view Chest X ray done in 6-8 weeks after hospital discharge or sooner if instructed by your Primary MD.  If you have Congestive Heart Failure: Please call your Cardiologist or Primary MD anytime you have any of the following symptoms:  1) 3 pound weight gain in 24 hours or 5 pounds in 1 week  2) shortness of breath, with or without a dry hacking cough  3) swelling in the hands, feet or stomach  4) if you have to sleep on extra pillows at night in order to breathe  Follow cardiac low salt diet and 1.5 lit/day fluid restriction.  If you have diabetes Accuchecks 4 times/day, Once in AM empty stomach and then before each meal. Log in all results and show them to your primary doctor at your next visit. If any glucose reading is under 80 or above 300 call your primary MD immediately.  If you have Seizure/Convulsions/Epilepsy: Please do not drive, operate heavy machinery, participate in activities at heights or participate in high speed sports until you have seen by Primary MD or a Neurologist and advised to do so again. Per [facility] statutes, patients with seizures are not allowed to drive until they have been  seizure-free for six months.  Use caution when using heavy equipment or power tools. Avoid working on ladders or at heights. Take showers instead of baths. Ensure the water temperature is not too high on the home water heater. Do not go swimming alone. Do not lock yourself in a room alone (i.e. bathroom). When caring for infants or small children, sit down when holding, feeding, or changing them to minimize risk of injury to the child in the event you have a seizure. Maintain good sleep hygiene. Avoid alcohol.   If you had Gastrointestinal Bleeding: Please ask your Primary MD to check a complete blood count within one week of discharge or at your next visit. Your endoscopic/colonoscopic biopsies that are pending at the time of discharge, will also need to followed by your Primary MD.  Get Medicines reviewed and adjusted. Please take all your medications with you for your next visit with your Primary MD  Please request your Primary MD to go over all hospital tests and procedure/radiological results at the follow up, please ask your Primary MD to get all Hospital records sent to his/her office.  If you experience worsening of your admission symptoms, develop shortness of breath, life threatening emergency, suicidal or homicidal thoughts you must seek medical attention immediately by calling 911 or calling your MD immediately  if symptoms less severe.  You must read complete instructions/literature along with all the possible adverse reactions/side effects for all the Medicines you  take and that have been prescribed to you. Take any new Medicines after you have completely understood and accpet all the possible adverse reactions/side effects.   Do not drive or operate heavy machinery when taking Pain medications.   Do not take more than prescribed Pain, Sleep and Anxiety Medications  Special Instructions: If you have smoked or chewed Tobacco  in the last 2 yrs please stop smoking, stop any regular  Alcohol  and or any Recreational drug use.  Wear Seat belts while driving.  Please note You were cared for by a hospitalist during your hospital stay. If you have any questions about your discharge medications or the care you received while you were in the hospital after you are discharged, you can call the unit and asked to speak with the hospitalist on call if the hospitalist that took care of you is not available. Once you are discharged, your primary care physician will handle any further medical issues. Please note that NO REFILLS for any discharge medications will be authorized once you are discharged, as it is imperative that you return to your primary care physician (or establish a relationship with a primary care physician if you do not have one) for your aftercare needs so that they can reassess your need for medications and monitor your lab values.  You can reach the hospitalist office at phone [phone] or fax [phone]   If you do not have a primary care physician, you can call [phone] for a physician referral.  Activity: As tolerated with Full fall precautions use walker/cane & assistance as needed    Diet: regular  Disposition Home

## 2019-11-03 NOTE — Discharge Summary (Signed)
Physician Discharge Summary  Jamie [name] GHW:[id number] DOB: [birth year] DOA: 11/01/2019  PCP: Jamie Olp, MD  Admit date: 11/01/2019 Discharge date: 11/03/2019  Admitted From: home Disposition:  home  Recommendations for Outpatient Follow-up:  1. Follow up with PCP in 1-2 weeks  Home Health: none Equipment/Devices: none  Discharge Condition: stable CODE STATUS: Full code Diet recommendation: regular  HPI: Per admitting MD, Jamie [name] is a 64 y.o. female with medical history significant of well-controlled type 2 diabetes mellitus, hypertension, hyperlipidemia, anxiety, ovarian cancer, right leg lymphedema presents to emergency department due to rash on right leg and fever and chills since 1 week. Patient tells me that she noticed rash on right thigh on Sunday which progressed to right leg this morning.  Reports subjective fever, chills, nausea, vomiting, generalized weakness, lethargy and decreased appetite as well. She tells me that since she had hysterectomy due to history of ovarian/endometrial cancer she has right leg lymphedema.  She denies trauma, previous history of similar rash, joint pain, photosensitivity, abdominal pain, insect bite, change in detergent, diet, medicine. Reports headache however denies blurry vision, lightheadedness, dizziness, chest pain, shortness of breath, palpitation, urinary symptoms. She lives with her husband at home.  No history of smoking, illicit drug use however drinks alcohol occasionally.  Hospital Course / Discharge diagnoses: Sepsis due to cellulitis-patient from a medical hospital with sepsis and a right lower extremity rash.  She was started on broad-spectrum antibiotics with vancomycin and ceftriaxone.  On hospital day 2, patient started forming some blisters on the inner aspect of the knee and inner thigh.  Given burning quality of the pain and vesicles, shingles could not be entirely excluded and she was started on Valtrex  as well.  Case was discussed over the phone with Dr. Megan Salon with infectious disease, pictures reviewed and he inclines to feel like this is more consistent with cellulitis.  With antibiotics and Valtrex patient has improved, rash is getting better, and she will be discharged home in stable condition with outpatient follow-up.  Her antibiotics will be transitioned to Keflex for 7 additional days, and due to few vesicles I would favor continue Valtrex for 3 additional days. Diabetes mellitus type 2-well-controlled, A1c last month was 6.5, continue home medications Essential hypertension-resume home medications Hyperlipidemia-resume statin History of endometrial/ovarian cancer-status post hysterectomy, outpatient follow-up.  Discharge Instructions  Allergies as of 11/03/2019      Reactions   Oxycodone-acetaminophen Itching, Swelling, Other (See Comments)   Tylox- Face became swollen      Medication List    STOP taking these medications   rosuvastatin 40 MG tablet Commonly known as: CRESTOR     TAKE these medications   ALPRAZolam 0.5 MG tablet Commonly known as: XANAX Take 1 tablet (0.5 mg total) by mouth at bedtime as needed. What changed: reasons to take this   atorvastatin 40 MG tablet Commonly known as: LIPITOR Take 1 tablet (40 mg total) by mouth daily. What changed: when to take this   benazepril 10 MG tablet Commonly known as: LOTENSIN TAKE 1 TABLET BY MOUTH DAILY What changed: when to take this   benzonatate 200 MG capsule Commonly known as: TESSALON Take 1 capsule (200 mg total) by mouth 2 (two) times daily as needed for cough.   cephALEXin 500 MG capsule Commonly known as: KEFLEX Take 1 capsule (500 mg total) by mouth 4 (four) times daily for 7 days.   cyclobenzaprine 10 MG tablet Commonly known as: FLEXERIL Take 10 mg by  mouth at bedtime.   glimepiride 2 MG tablet Commonly known as: AMARYL Take 1 tablet (2 mg total) by mouth daily.   metFORMIN 1000 MG  tablet Commonly known as: GLUCOPHAGE TAKE 1 TABLET BY MOUTH TWICE A DAY WITH MEALS What changed:   how much to take  how to take this  when to take this  additional instructions   metoprolol tartrate 100 MG tablet Commonly known as: LOPRESSOR Take 1 tablet by mouth once for procedure.   ondansetron 8 MG tablet Commonly known as: Zofran Take 1 tablet (8 mg total) by mouth every 8 (eight) hours as needed for nausea or vomiting.   triamcinolone cream 0.1 % Commonly known as: KENALOG Apply 1 application topically 2 (two) times daily. For 10 days maximum and then take at least 10 day break What changed:   when to take this  reasons to take this  additional instructions   valACYclovir 1000 MG tablet Commonly known as: VALTREX Take 1 tablet (1,000 mg total) by mouth 3 (three) times daily for 3 days.       Follow-up Information    Jamie Olp, MD. Schedule an appointment as soon as possible for a visit in 1 week(s).   Specialty: Family Medicine Contact information: [place] [address] [phone]               Consultations:  None   Procedures/Studies:  CT CORONARY MORPH W/CTA COR W/SCORE W/CA W/CM &/OR WO/CM  Addendum Date: 10/28/2019   ADDENDUM REPORT: 10/28/2019 13:56 CLINICAL DATA:  Chest pain EXAM: Cardiac CTA MEDICATIONS: Sub lingual nitro. 4 mg TECHNIQUE: The patient was scanned on a [employer] 192 scanner. Gantry rotation speed was 250 msecs. Collimation was. 6 mm . A 120 kV prospective scan was triggered in the ascending thoracic aorta at 140 HU's with full mA between 30-70% of the R-R interval . Average HR during the scan was 58 bpm. The 3D data set was interpreted on a dedicated work station using MPR, MIP and VRT modes. A total of 80 cc of contrast was used. FINDINGS: Non-cardiac: See separate report from [facility]. No significant findings on limited lung and soft tissue windows. Calcium score: Calcium noted  in LM Coronary Arteries: Right dominant with no anomalies LM: 1-24% calcific plaque that does not encroach on lumen exophytic remodeling LAD: Normal D1: Normal D2: Normal Circumflex: Normal OM1: AV Groove: Normal RCA: Normal PDA: Normal PLA: Normal IMPRESSION: 1. Normal aortic root 3.0 cm 2.  Calcium score 31 which is 71% percentile for age and sex 54.  CAD RAD 1 non obstructive CAD see description above [place] Electronically Signed   By: [place] Rouge M.D.   On: 10/28/2019 13:56   Result Date: 10/28/2019 EXAM: OVER-READ INTERPRETATION  CT CHEST The following report is an over-read performed by radiologist Dr. Vinnie Langton of [facility], [place] on 10/28/2019. This over-read does not include interpretation of cardiac or coronary anatomy or pathology. The coronary calcium score/coronary CTA interpretation by the cardiologist is attached. COMPARISON:  None. FINDINGS: Aortic atherosclerosis. Within the visualized portions of the thorax there are no suspicious appearing pulmonary nodules or masses, there is no acute consolidative airspace disease, no pleural effusions, no pneumothorax and no lymphadenopathy. Visualized portions of the upper abdomen are unremarkable. There are no aggressive appearing lytic or blastic lesions noted in the visualized portions of the skeleton. IMPRESSION: 1.  Aortic Atherosclerosis (ICD10-I70.0). Electronically Signed: By: Vinnie Langton M.D. On: 10/28/2019  11:05   DG Chest Port 1 View  Result Date: 11/01/2019 CLINICAL DATA:  Sepsis EXAM: PORTABLE CHEST 1 VIEW COMPARISON:  06/27/2019 FINDINGS: The heart size and mediastinal contours are within normal limits. Both lungs are clear. The visualized skeletal structures are unremarkable. IMPRESSION: No active disease. Electronically Signed   By: Constance Holster M.D.   On: 11/01/2019 17:14     Subjective: - no chest pain, shortness of breath, no abdominal pain, nausea or vomiting.   Discharge Exam: BP 117/68 (BP  Location: Left Arm)   Pulse 92   Temp 98 F (36.7 C) (Oral)   Resp 12   Ht 5\' 2"  (1.575 m)   Wt 61.6 kg   LMP  (LMP Unknown)   SpO2 98%   BMI 24.82 kg/m   General: Pt is alert, awake, not in acute distress Cardiovascular: RRR, S1/S2 +, no rubs, no gallops Respiratory: CTA bilaterally, no wheezing, no rhonchi Abdominal: Soft, NT, ND, bowel sounds + Extremities: rash improving    The results of significant diagnostics from this hospitalization (including imaging, microbiology, ancillary and laboratory) are listed below for reference.     Microbiology: Recent Results (from the past 240 hour(s))  Blood Culture (routine x 2)     Status: None (Preliminary result)   Collection Time: 11/01/19  5:38 PM   Specimen: BLOOD RIGHT FOREARM  Result Value Ref Range Status   Specimen Description BLOOD RIGHT FOREARM  Final   Special Requests   Final    BOTTLES DRAWN AEROBIC AND ANAEROBIC Blood Culture results may not be optimal due to an inadequate volume of blood received in culture bottles   Culture   Final    NO GROWTH 2 DAYS Performed at [facility], [place] [address] [place], [place] [address]    Report Status PENDING  Incomplete  Urine culture     Status: Abnormal   Collection Time: 11/01/19  7:50 PM   Specimen: Urine, Random  Result Value Ref Range Status   Specimen Description URINE, RANDOM  Final   Special Requests   Final    GRAY TOP URINE SENT Performed at [facility], [place] [address], [place], [place] [address]    Culture MULTIPLE SPECIES PRESENT, SUGGEST RECOLLECTION (A)  Final   Report Status 11/03/2019 FINAL  Final  Blood Culture (routine x 2)     Status: None (Preliminary result)   Collection Time: 11/01/19  9:39 PM   Specimen: BLOOD RIGHT ARM  Result Value Ref Range Status   Specimen Description BLOOD RIGHT ARM  Final   Special Requests   Final    BOTTLES DRAWN AEROBIC AND ANAEROBIC Blood Culture results may not be optimal due to an inadequate volume of  blood received in culture bottles   Culture   Final    NO GROWTH 2 DAYS Performed at [facility], [place] [address], [place],  [address]    Report Status PENDING  Incomplete  SARS Coronavirus 2 by RT PCR (hospital order, performed in [facility] hospital lab) Nasopharyngeal Nasopharyngeal Swab     Status: None   Collection Time: 11/01/19 10:34 PM   Specimen: Nasopharyngeal Swab  Result Value Ref Range Status   SARS Coronavirus 2 NEGATIVE NEGATIVE Final    Comment: (NOTE) SARS-CoV-2 target nucleic acids are NOT DETECTED.  The SARS-CoV-2 RNA is generally detectable in upper and lower respiratory specimens during the acute phase of infection. The lowest concentration of SARS-CoV-2 viral copies this assay can  detect is 250 copies / mL. A negative result does not preclude SARS-CoV-2 infection and should not be used as the sole basis for treatment or other patient management decisions.  A negative result may occur with improper specimen collection / handling, submission of specimen other than nasopharyngeal swab, presence of viral mutation(s) within the areas targeted by this assay, and inadequate number of viral copies (<250 copies / mL). A negative result must be combined with clinical observations, patient history, and epidemiological information.  Fact Sheet for Patients:   StrictlyIdeas.no  Fact Sheet for Healthcare Providers: BankingDealers.co.za  This test is not yet approved or  cleared by the [place] FDA and has been authorized for detection and/or diagnosis of SARS-CoV-2 by FDA under an Emergency Use Authorization (EUA).  This EUA will remain in effect (meaning this test can be used) for the duration of the COVID-19 declaration under Section 564(b)(1) of the Act, 21 U.S.C. section 360bbb-3(b)(1), unless the authorization is terminated or revoked sooner.  Performed at [facility], [place] [address],  [place], [place] [address]      Labs: Basic Metabolic Panel: Recent Labs  Lab 11/01/19 1357 11/01/19 2230 11/02/19 0241 11/03/19 0517  NA 130*  --  135 139  K 3.5  --  3.2* 4.5  CL 96*  --  104 109  CO2 22  --  21* 22  GLUCOSE 258*  --  174* 124*  BUN 18  --  12 8  CREATININE 1.17* 0.99 0.89 0.87  CALCIUM 9.0  --  8.5* 8.6*  MG  --  2.1  --   --   PHOS  --  1.8*  --   --    Liver Function Tests: Recent Labs  Lab 11/01/19 1357 11/02/19 0241 11/03/19 0517  AST 16 13* 32  ALT 21 16 35  ALKPHOS 101 78 134*  BILITOT 1.1 0.7 0.6  PROT 6.6 5.6* 5.3*  ALBUMIN 3.3* 2.6* 2.4*   CBC: Recent Labs  Lab 11/01/19 1357 11/01/19 2230 11/02/19 0241 11/03/19 0517  WBC 17.0* 11.9* 10.9* 8.1  NEUTROABS 15.9*  --   --   --   HGB 14.7 13.6 12.1 12.0  HCT 43.8 41.0 35.8* 36.3  MCV 86.6 87.2 85.6 87.7  PLT 244 214 191 230   CBG: Recent Labs  Lab 11/02/19 1718 11/02/19 2057 11/03/19 0628 11/03/19 0731 11/03/19 1145  GLUCAP 99 114* 126* 138* 157*   Hgb A1c No results for input(s): HGBA1C in the last 72 hours. Lipid Profile No results for input(s): CHOL, HDL, LDLCALC, TRIG, CHOLHDL, LDLDIRECT in the last 72 hours. Thyroid function studies Recent Labs    11/01/19 2230  TSH 7.639*   Urinalysis    Component Value Date/Time   COLORURINE YELLOW 11/01/2019 1950   APPEARANCEUR HAZY (A) 11/01/2019 1950   LABSPEC 1.006 11/01/2019 1950   PHURINE 5.0 11/01/2019 1950   GLUCOSEU 50 (A) 11/01/2019 1950   GLUCOSEU NEGATIVE 07/22/2007 1547   HGBUR MODERATE (A) 11/01/2019 1950   BILIRUBINUR NEGATIVE 11/01/2019 1950   BILIRUBINUR N 03/27/2018 0934   KETONESUR NEGATIVE 11/01/2019 1950   PROTEINUR NEGATIVE 11/01/2019 1950   UROBILINOGEN 0.2 03/27/2018 0934   UROBILINOGEN 0.2 08/29/2009 1642   NITRITE POSITIVE (A) 11/01/2019 1950   LEUKOCYTESUR SMALL (A) 11/01/2019 1950    FURTHER DISCHARGE INSTRUCTIONS:   Get Medicines reviewed and adjusted: Please take all your medications  with you for your next visit with your Primary MD   Laboratory/radiological data: Please  request your Primary MD to go over all hospital tests and procedure/radiological results at the follow up, please ask your Primary MD to get all Hospital records sent to his/her office.   In some cases, they will be blood work, cultures and biopsy results pending at the time of your discharge. Please request that your primary care M.D. goes through all the records of your hospital data and follows up on these results.   Also Note the following: If you experience worsening of your admission symptoms, develop shortness of breath, life threatening emergency, suicidal or homicidal thoughts you must seek medical attention immediately by calling 911 or calling your MD immediately  if symptoms less severe.   You must read complete instructions/literature along with all the possible adverse reactions/side effects for all the Medicines you take and that have been prescribed to you. Take any new Medicines after you have completely understood and accpet all the possible adverse reactions/side effects.    Do not drive when taking Pain medications or sleeping medications (Benzodaizepines)   Do not take more than prescribed Pain, Sleep and Anxiety Medications. It is not advisable to combine anxiety,sleep and pain medications without talking with your primary care practitioner   Special Instructions: If you have smoked or chewed Tobacco  in the last 2 yrs please stop smoking, stop any regular Alcohol  and or any Recreational drug use.   Wear Seat belts while driving.   Please note: You were cared for by a hospitalist during your hospital stay. Once you are discharged, your primary care physician will handle any further medical issues. Please note that NO REFILLS for any discharge medications will be authorized once you are discharged, as it is imperative that you return to your primary care physician (or establish a  relationship with a primary care physician if you do not have one) for your post hospital discharge needs so that they can reassess your need for medications and monitor your lab values.  Time coordinating discharge: 40 minutes  SIGNED:  Marzetta Board, MD, PhD 11/03/2019, 2:06 PM

## 2019-11-04 ENCOUNTER — Other Ambulatory Visit: Payer: Self-pay

## 2019-11-04 ENCOUNTER — Telehealth: Payer: Self-pay

## 2019-11-04 DIAGNOSIS — E785 Hyperlipidemia, unspecified: Secondary | ICD-10-CM

## 2019-11-04 NOTE — Telephone Encounter (Signed)
Called patient left message on personal voice mail Dr.Berry advised to have a fasting lipid panel in August.No appointment needed.No food after midnight.Water only.Lab opens 8:00 am to 12:00 noon.Order placed.Also he advised to follow up with him in 1 year.

## 2019-11-06 ENCOUNTER — Telehealth: Payer: Self-pay | Admitting: Family Medicine

## 2019-11-06 LAB — CULTURE, BLOOD (ROUTINE X 2)
Culture: NO GROWTH
Culture: NO GROWTH

## 2019-11-06 NOTE — Telephone Encounter (Signed)
Pt called stating she was discharged from hospital and is now at home. Pt is still experiencing leg pain and it is swollen. Pt had thought about going back to work but states her work told her she would lose the pay from the days she was out if she comes back. Pt states her work is recommending she takes a short term disability. Pt would like to speak with Dr. Yong Channel about this and get his recommendation on what she should do and also talk about her leg. Please advise.

## 2019-11-06 NOTE — Telephone Encounter (Signed)
Called pt back. Pt stated she was able to get her ted hose on and was elevating her legs. Swelling has decreased. Pt stated she is going to continue wearing the ted hose over the weekend and see how her leg feels on Monday.

## 2019-11-06 NOTE — Telephone Encounter (Signed)
Please schedule pt an OV or virtual to discuss this with Dr. Yong Channel.

## 2019-11-13 ENCOUNTER — Encounter: Payer: Self-pay | Admitting: Family Medicine

## 2019-11-13 ENCOUNTER — Ambulatory Visit (INDEPENDENT_AMBULATORY_CARE_PROVIDER_SITE_OTHER): Payer: 59 | Admitting: Family Medicine

## 2019-11-13 ENCOUNTER — Other Ambulatory Visit: Payer: Self-pay

## 2019-11-13 VITALS — BP 126/72 | HR 81 | Temp 98.9°F | Ht 62.0 in | Wt 136.0 lb

## 2019-11-13 DIAGNOSIS — N179 Acute kidney failure, unspecified: Secondary | ICD-10-CM

## 2019-11-13 DIAGNOSIS — I10 Essential (primary) hypertension: Secondary | ICD-10-CM

## 2019-11-13 DIAGNOSIS — Z8619 Personal history of other infectious and parasitic diseases: Secondary | ICD-10-CM

## 2019-11-13 DIAGNOSIS — E119 Type 2 diabetes mellitus without complications: Secondary | ICD-10-CM | POA: Diagnosis not present

## 2019-11-13 DIAGNOSIS — E1159 Type 2 diabetes mellitus with other circulatory complications: Secondary | ICD-10-CM

## 2019-11-13 DIAGNOSIS — I152 Hypertension secondary to endocrine disorders: Secondary | ICD-10-CM

## 2019-11-13 DIAGNOSIS — E785 Hyperlipidemia, unspecified: Secondary | ICD-10-CM

## 2019-11-13 DIAGNOSIS — L03115 Cellulitis of right lower limb: Secondary | ICD-10-CM

## 2019-11-13 DIAGNOSIS — E1169 Type 2 diabetes mellitus with other specified complication: Secondary | ICD-10-CM

## 2019-11-13 DIAGNOSIS — I890 Lymphedema, not elsewhere classified: Secondary | ICD-10-CM

## 2019-11-13 LAB — CBC WITH DIFFERENTIAL/PLATELET
Absolute Monocytes: 367 cells/uL (ref 200–950)
Basophils Absolute: 58 cells/uL (ref 0–200)
Basophils Relative: 0.8 %
Eosinophils Absolute: 526 cells/uL — ABNORMAL HIGH (ref 15–500)
Eosinophils Relative: 7.3 %
HCT: 38.9 % (ref 35.0–45.0)
Hemoglobin: 12.8 g/dL (ref 11.7–15.5)
Lymphs Abs: 2102 cells/uL (ref 850–3900)
MCH: 29.1 pg (ref 27.0–33.0)
MCHC: 32.9 g/dL (ref 32.0–36.0)
MCV: 88.4 fL (ref 80.0–100.0)
MPV: 10.1 fL (ref 7.5–12.5)
Monocytes Relative: 5.1 %
Neutro Abs: 4147 cells/uL (ref 1500–7800)
Neutrophils Relative %: 57.6 %
Platelets: 406 10*3/uL — ABNORMAL HIGH (ref 140–400)
RBC: 4.4 10*6/uL (ref 3.80–5.10)
RDW: 13.9 % (ref 11.0–15.0)
Total Lymphocyte: 29.2 %
WBC: 7.2 10*3/uL (ref 3.8–10.8)

## 2019-11-13 LAB — COMPREHENSIVE METABOLIC PANEL
AG Ratio: 2.2 (calc) (ref 1.0–2.5)
ALT: 22 U/L (ref 6–29)
AST: 15 U/L (ref 10–35)
Albumin: 4.2 g/dL (ref 3.6–5.1)
Alkaline phosphatase (APISO): 117 U/L (ref 37–153)
BUN: 11 mg/dL (ref 7–25)
CO2: 27 mmol/L (ref 20–32)
Calcium: 9.6 mg/dL (ref 8.6–10.4)
Chloride: 103 mmol/L (ref 98–110)
Creat: 0.89 mg/dL (ref 0.50–0.99)
Globulin: 1.9 g/dL (calc) (ref 1.9–3.7)
Glucose, Bld: 114 mg/dL — ABNORMAL HIGH (ref 65–99)
Potassium: 4.2 mmol/L (ref 3.5–5.3)
Sodium: 140 mmol/L (ref 135–146)
Total Bilirubin: 0.4 mg/dL (ref 0.2–1.2)
Total Protein: 6.1 g/dL (ref 6.1–8.1)

## 2019-11-13 MED ORDER — ROSUVASTATIN CALCIUM 40 MG PO TABS: 40.0000 mg | ORAL_TABLET | Freq: Every day | ORAL | 3 refills | Status: DC

## 2019-11-13 NOTE — Patient Instructions (Addendum)
Health Maintenance Due  Topic Date Due  . PAP SMEAR- Sign release of information at the check out desk for last pap smear 07/26/2019   Please stop by lab before you go If you have mychart- we will send your results within 3 business days of [place] receiving them.  If you do not have mychart- we will call you about results within 5 business days of [place] receiving them.   Stop atorvastatin 40mg  and start rosuvastatin 40mg  for cholesterol. If you would like you can double up on atorvastatin until you run out- as that is similar to rosuvastatin 40mg .   Recommended follow up: Return for as needed for new, worsening, persistent symptoms. otherwise keep september visit to recheck a1c.

## 2019-11-13 NOTE — Addendum Note (Signed)
Addended by: Doran Clay A on: 11/13/2019 02:57 PM   Modules accepted: Orders

## 2019-11-13 NOTE — Addendum Note (Signed)
Addended by: Doran Clay A on: 11/13/2019 02:56 PM   Modules accepted: Orders

## 2019-11-13 NOTE — Addendum Note (Signed)
Addended by: Marin Olp on: 11/13/2019 02:51 PM   Modules accepted: Orders

## 2019-11-13 NOTE — Assessment & Plan Note (Signed)
plan at last visit was to change to rosuvastatin but does not appear team sent that in- we will update today . Push for LDL at least under 100 prefer under 70

## 2019-11-13 NOTE — Progress Notes (Signed)
Phone [phone]   Subjective:  Jamie [name] is a 64 y.o. year old very pleasant female patient who presents for transitional care management and hospital follow up for Sepsis. Patient was hospitalized from 11/01/19 to 11/03/19. A TCM phone call was not completed-so TCM will not be billed  Patient presented to the hospital with rash on right leg and fever and chills for 1 week.  Started with rash on her right thigh which progressed until hospitalization and also associated with fever chills nausea vomiting generalized weakness lethargy and decreased appetite. Thought she had food poisoning until noted rash next day -Patient with history of lymphedema in right leg related to ovarian/endometrial cancer and surgery.  No recent trauma.  Denies recent insect bite or other entry point intially- later realized has slight split in right 5th toe. Had hard time walking due to pain.  -Patient was treated with vancomycin and ceftriaxone-on day 2 of hospitalization developed some blisters on inner aspect of the knee and inner thigh.  There was some concern for shingles so Valtrex was started. -Phone consult with Dr. Megan Salon with infectious disease-he thought this was more consistent with cellulitis -He was transitioned to Keflex at discharge and treated with 7-day course of Valtrex - leg is drastically improved today -she is trying massage and compression stockings and leg elevation and that is helping some with lymphedema  For her diabetes.  A1c has recently been well controlled.  She was continued on home medications  Lab Results  Component Value Date   HGBA1C 6.5 (A) 09/08/2019   HGBA1C 9.1 (H) 11/17/2018   HGBA1C 7.6 (A) 03/27/2018   For hypertension she was maintained on home medication  For hyperlipidemia she was maintained on home statin- atorvastatin 40mg -  Lab Results  Component Value Date   CHOL 187 11/17/2018   HDL 30.20 (L) 11/17/2018   LDLCALC 105 07/21/2015   LDLDIRECT 101.0  09/08/2019   TRIG (H) 11/17/2018    443.0 Triglyceride is over 400; calculations on Lipids are invalid.   CHOLHDL 6 11/17/2018    Had acute kidney injury-this improved by hospital discharge-we will update CMP today   See problem oriented charting as well  Past Medical History-  Patient Active Problem List   Diagnosis Date Noted  . History of sepsis 11/01/2019    Priority: High  . Diabetes mellitus, type 2 ([place]) 04/25/2007    Priority: High  . Lymphedema of leg 08/27/2011    Priority: Medium  . Hyperlipidemia associated with type 2 diabetes mellitus ([place]) 01/27/2007    Priority: Medium  . Hypertension associated with diabetes ([place]) 01/27/2007    Priority: Medium  . Fibromyalgia 01/27/2007    Priority: Medium  . Bunion of great toe 09/08/2019    Priority: Low  . Rash 10/28/2018    Priority: Low  . History of ovarian cancer 03/27/2018    Priority: Low  . Abdominal pain 05/22/2012    Priority: Low  . Nonspecific elevation of levels of transaminase or lactic acid dehydrogenase (LDH) 08/03/2010    Priority: Low  . Allergic rhinitis 01/27/2007    Priority: Low  . GERD 01/27/2007    Priority: Low  . MENOPAUSE-RELATED VASOMOTOR SYMPTOMS, HOT FLASHES 01/27/2007    Priority: Low  . Atypical chest pain 01/27/2007    Priority: Low  . AKI (acute kidney injury) ([place])   . History of endometrial cancer 05/12/2019    Medications- reviewed and updated  A medical reconciliation was performed comparing current medicines to hospital discharge  medications. Current Outpatient Medications  Medication Sig Dispense Refill  . ALPRAZolam (XANAX) 0.5 MG tablet Take 1 tablet (0.5 mg total) by mouth at bedtime as needed. (Patient taking differently: Take 0.5 mg by mouth at bedtime as needed for anxiety or sleep. ) 30 tablet 2  . atorvastatin (LIPITOR) 40 MG tablet Take 1 tablet (40 mg total) by mouth daily. (Patient taking differently: Take 40 mg by mouth at bedtime. ) 90 tablet 3  . benazepril  (LOTENSIN) 10 MG tablet TAKE 1 TABLET BY MOUTH DAILY (Patient taking differently: Take 10 mg by mouth at bedtime. ) 90 tablet 2  . benzonatate (TESSALON) 200 MG capsule Take 1 capsule (200 mg total) by mouth 2 (two) times daily as needed for cough. 20 capsule 0  . cyclobenzaprine (FLEXERIL) 10 MG tablet Take 10 mg by mouth at bedtime.     Marland Kitchen glimepiride (AMARYL) 2 MG tablet Take 1 tablet (2 mg total) by mouth daily. 90 tablet 3  . metFORMIN (GLUCOPHAGE) 1000 MG tablet TAKE 1 TABLET BY MOUTH TWICE A DAY WITH MEALS (Patient taking differently: Take 1,000 mg by mouth See admin instructions. Take 1,000 mg by mouth two times a day- with lunch and supper) 180 tablet 2  . metoprolol tartrate (LOPRESSOR) 100 MG tablet Take 1 tablet by mouth once for procedure. 1 tablet 0  . ondansetron (ZOFRAN) 8 MG tablet Take 1 tablet (8 mg total) by mouth every 8 (eight) hours as needed for nausea or vomiting. 20 tablet 0  . triamcinolone cream (KENALOG) 0.1 % Apply 1 application topically 2 (two) times daily. For 10 days maximum and then take at least 10 day break (Patient taking differently: Apply 1 application topically 2 (two) times daily as needed (to affected area(s)- For 10 days maximum and then take at least 10 day's break). ) 30 g 3    Objective  Objective:  BP 126/72   Pulse 81   Temp 98.9 F (37.2 C) (Temporal)   Ht 5\' 2"  (1.575 m)   Wt 136 lb (61.7 kg)   LMP  (LMP Unknown)   SpO2 95%   BMI 24.87 kg/m  Gen: NAD, resting comfortably CV: RRR no murmurs rubs or gallops Lungs: CTAB no crackles, wheeze, rhonchi Abdomen: soft/nontender/nondistended/normal bowel sounds. No rebound or guarding.  Ext: no edema Skin: warm, dry, mild warmth and erythema on right leg compared to left but drastically improved from hospital pictures. Mild tenderness. Trace to 1+ edema      Assessment and Plan:    # Hospital follow up for cellulitis.   1. Cellulitis of right leg Patient in hospital 6/27 to 6/29 for Sepsis  due to cellulitis. Doing much better. May remain off antbiotics after finishing keflex. She also completed valtrex course for possible shingles. Discussed possibly doing shingrix in 6 months - she reflects back and had a slight cut on 5th toe and that could have been entry point  2. Lymphedema of right lower extremity Stable- wonder if poor blood flow may have contributed to cellulitis. continue elevation, compression, massage  3. Type 2 diabetes mellitus without complication, without long-term current use of insulin (HCC) Diabetes has been well controlled-continue Metformin and glimepiride  4. Hypertension associated with diabetes ([place]) Blood pressure looks excellent on benazepril 10 mg-continue current medication  5. Hyperlipidemia associated with type 2 diabetes mellitus ([place]) plan at last visit was to change to rosuvastatin but does not appear team sent that in- we will update today . Push for LDL  at least under 100 prefer under 70  6. History of sepsis Sepsis has fully resolved.  Update CBC and CMP. Blood cultures negative. Urine culture with multiple species- she has rare dysuria- If worsening symptoms she will let us know.   7. AKI (acute kidney injury) ([place]) Mild worsening of kidney function while hospitalized-update CMP today  Recommended follow up: Return for as needed for new, worsening, persistent symptoms. otherwise keep september visit to recheck a1c. Future Appointments  Date Time Provider [place]  01/14/2020  4:00 PM Marin Olp, MD [facility]   Lab/Order associations:   ICD-10-CM   1. Cellulitis of right leg  L03.115 CBC with Differential/Platelet    Comprehensive metabolic panel  2. Lymphedema of right lower extremity  I89.0   3. Type 2 diabetes mellitus without complication, without long-term current use of insulin (HCC)  E11.9 CBC with Differential/Platelet    Comprehensive metabolic panel  4. Hypertension associated with diabetes ([place])  E11.59     I10   5. Hyperlipidemia associated with type 2 diabetes mellitus (HCC)  E11.69    E78.5   6. History of sepsis  Z86.19   7. AKI (acute kidney injury) ([place])  N17.9     Meds ordered this encounter  Medications  . rosuvastatin (CRESTOR) 40 MG tablet    Sig: Take 1 tablet (40 mg total) by mouth daily.    Dispense:  90 tablet    Refill:  3    Return precautions advised.  Garret Reddish, MD

## 2019-11-16 ENCOUNTER — Other Ambulatory Visit: Payer: Self-pay | Admitting: Family Medicine

## 2019-11-27 ENCOUNTER — Other Ambulatory Visit: Payer: Self-pay | Admitting: Family Medicine

## 2020-01-07 ENCOUNTER — Other Ambulatory Visit: Payer: Self-pay | Admitting: Family Medicine

## 2020-01-13 NOTE — Patient Instructions (Addendum)
Health Maintenance Due  Topic Date Due  . INFLUENZA VACCINE Will get in office today  12/06/2019   Schedule your bone density test at check out desk. You may also call directly to X-ray at [phone] to schedule an appointment that is convenient for you.  - located [address] across the street from [place] - in the basement - you do need an appointment for the bone density tests.   The plan is to be on rosuvastatin 40mg . If you have any atorvastatin 40mg  left take two of these until completed and then take rosuvastatin all the time. If they refill atorvastatin please return it- rosuvastatin only from now on  Thanks for doing labs If you have mychart- we will send your results within 3 business days of [place] receiving them.  If you do not have mychart- we will call you about results within 5 business days of [place] receiving them.  *please note we are currently using [facility] which has a longer processing time than [facility] typically so labs may not come back as quickly as in the past *please also note that you will see labs on mychart as soon as they post. I will later go in and write notes on them- will say "notes from Dr. Yong Channel"

## 2020-01-13 NOTE — Progress Notes (Signed)
Phone [phone]   Subjective:  Patient presents today for their annual physical. Chief complaint-noted.   See problem oriented charting- Review of Systems  Constitutional: Negative for chills and fever.  HENT: Negative for congestion, hearing loss and sore throat.   Eyes: Positive for blurred vision (if sugar gets really high- improves with improved sugar). Negative for double vision.  Respiratory: Negative for cough.   Cardiovascular: Negative for chest pain and palpitations.  Gastrointestinal: Positive for heartburn (prilosec helps- takes tums as well at times). Negative for constipation, diarrhea, nausea and vomiting.  Genitourinary: Negative for dysuria and frequency.  Musculoskeletal: Positive for joint pain and myalgias. Negative for back pain.  Skin: Negative for itching and rash.  Neurological: Negative for dizziness and headaches.  Endo/Heme/Allergies: Positive for polydipsia (particularly at night). Bruises/bleeds easily.  Psychiatric/Behavioral: Negative for depression, substance abuse and suicidal ideas.   The following were reviewed and entered/updated in epic: Past Medical History:  Diagnosis Date  . Allergy   . Diabetes mellitus    ORAL MEDS-NO INSULIN  . Endometrial cancer ([place]) 03/2011   03/2011  . GERD (gastroesophageal reflux disease)   . Hiatal hernia   . History of cervical dysplasia   . Hyperlipidemia   . Malignant neoplasm of body of uterus ([place]) 03/21/2011   S.p hysterectomy  . Ovarian cancer ([place]) 03/2011   03/2011  . PONV (postoperative nausea and vomiting)    ONLY AFTER CERVICAL FUSION--NO PROB WITH OTHER SURGERIES  . Ulcer    history of gastritis   Patient Active Problem List   Diagnosis Date Noted  . History of sepsis 11/01/2019    Priority: High  . Diabetes mellitus, type 2 ([place]) 04/25/2007    Priority: High  . Lymphedema of leg 08/27/2011    Priority: Medium  . Hyperlipidemia associated with type 2 diabetes mellitus ([place]) 01/27/2007     Priority: Medium  . Hypertension associated with diabetes ([place]) 01/27/2007    Priority: Medium  . Fibromyalgia 01/27/2007    Priority: Medium  . Bunion of great toe 09/08/2019    Priority: Low  . Rash 10/28/2018    Priority: Low  . History of ovarian cancer 03/27/2018    Priority: Low  . Abdominal pain 05/22/2012    Priority: Low  . Nonspecific elevation of levels of transaminase or lactic acid dehydrogenase (LDH) 08/03/2010    Priority: Low  . Allergic rhinitis 01/27/2007    Priority: Low  . GERD 01/27/2007    Priority: Low  . MENOPAUSE-RELATED VASOMOTOR SYMPTOMS, HOT FLASHES 01/27/2007    Priority: Low  . Atypical chest pain 01/27/2007    Priority: Low  . AKI (acute kidney injury) ([place])   . History of endometrial cancer 05/12/2019   Past Surgical History:  Procedure Laterality Date  . ACHILLES TENDON SURGERY     MULTIPLE SURGERIES BECAUSE OF STAPH INFECTION  . CERVICAL FUSION  09/2003  . GYNECOLOGIC CRYOSURGERY  02/2003  . LUMBAR FUSION  11/1998  . LYMPH NODE DISSECTION  2012  . TOTAL VAGINAL HYSTERECTOMY  03/29/2011    Family History  Problem Relation Age of Onset  . Diabetes Father   . Heart disease Father   . Prostate cancer Father   . Lymphoma Father   . Lung cancer Mother        smoker  . Ovarian cancer Cousin   . Uterine cancer Cousin   . Breast cancer Maternal Aunt   . Colon cancer Paternal Grandmother 86  . Hemochromatosis Maternal Grandmother   .  Diabetes Paternal Grandfather   . Lung cancer Sister   . Healthy Brother     Medications- reviewed and updated Current Outpatient Medications  Medication Sig Dispense Refill  . ALPRAZolam (XANAX) 0.5 MG tablet Take 1 tablet (0.5 mg total) by mouth at bedtime as needed. (Patient taking differently: Take 0.5 mg by mouth at bedtime as needed for anxiety or sleep. ) 30 tablet 2  . benazepril (LOTENSIN) 10 MG tablet TAKE 1 TABLET BY MOUTH DAILY 90 tablet 1  . cyclobenzaprine (FLEXERIL) 10 MG tablet Take 10  mg by mouth at bedtime.     Marland Kitchen glimepiride (AMARYL) 2 MG tablet TAKE 1 TABLET(2 MG) BY MOUTH DAILY 90 tablet 3  . metFORMIN (GLUCOPHAGE) 1000 MG tablet TAKE 1 TABLET BY MOUTH TWICE A DAY WITH MEALS 180 tablet 1  . omeprazole (PRILOSEC) 20 MG capsule Take 20 mg by mouth daily.    Marland Kitchen triamcinolone cream (KENALOG) 0.1 % Apply 1 application topically 2 (two) times daily. For 10 days maximum and then take at least 10 day break (Patient taking differently: Apply 1 application topically 2 (two) times daily as needed (to affected area(s)- For 10 days maximum and then take at least 10 day's break). ) 30 g 3  . ondansetron (ZOFRAN) 8 MG tablet Take 1 tablet (8 mg total) by mouth every 8 (eight) hours as needed for nausea or vomiting. (Patient not taking: Reported on 01/14/2020) 20 tablet 0  . rosuvastatin (CRESTOR) 40 MG tablet Take 1 tablet (40 mg total) by mouth daily. 90 tablet 3   No current facility-administered medications for this visit.    Allergies-reviewed and updated Allergies  Allergen Reactions  . Oxycodone-Acetaminophen Itching, Swelling and Other (See Comments)    Tylox- Face became swollen    Social History   Social History Narrative   Married. 2 children (biological son 64, 1 step son 6 in 2017). 4 grandkids.       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 [place], canoes.      Hobbies: roller skating- works at [employer] too. Also walks regularly. Thinking about doing bicycling on trainer.    Objective  Objective:  BP 124/70   Pulse 71   Temp 98.7 F (37.1 C) (Temporal)   Ht 5\' 2"  (1.575 m)   Wt 136 lb (61.7 kg)   LMP  (LMP Unknown)   SpO2 98%   BMI 24.87 kg/m  Gen: NAD, resting comfortably HEENT: Mucous membranes are moist. Oropharynx normal Neck: no thyromegaly CV: RRR no murmurs rubs or gallops Lungs: CTAB no crackles,  wheeze, rhonchi Abdomen: soft/nontender/nondistended/normal bowel sounds. No rebound or guarding.  Ext: no edema Skin: warm, dry Neuro: grossly normal, moves all extremities, PERRLA   Assessment and Plan   64 y.o. female presenting for annual physical.  Health Maintenance counseling: 1. Anticipatory guidance: Patient counseled regarding regular dental exams -q6 months, eye exams - yearly,  avoiding smoking and second hand smoke (some outdoor second hand smoke exposure at camp ground) , limiting alcohol to 1 beverage per day- once a month .   2. Risk factor reduction:  Advised patient of need for regular exercise and diet rich and fruits and vegetables to reduce risk of heart attack and stroke. Exercise-  Walking at lunch about 20 minutes 3-4 days a week. Diet-some mild nausea with many  foods and feels like not eating as much but tries to eat healthy when she eats, calories still adequate but from not best source- dr. Malachi Bonds feels like helps with indigestion.  Wt Readings from Last 3 Encounters:  01/14/20 136 lb (61.7 kg)  11/13/19 136 lb (61.7 kg)  11/03/19 135 lb 11.2 oz (61.6 kg)  3. Immunizations/screenings/ancillary studies- flu shot today . Declines shingrix for now.  Immunization History  Administered Date(s) Administered  . Influenza,inj,Quad PF,6+ Mos 03/19/2013, 03/27/2018  . PFIZER SARS-COV-2 Vaccination 08/13/2019, 09/07/2019  . Pneumococcal Polysaccharide-23 04/28/2013  . Tdap 07/26/2014  4. Cervical cancer screening- 07/2016 with 5 year repeat due to normal pap plus HPV negative. She will be passed 64 at time due next- she will discuss with GYN 5. Breast cancer screening-  breast exam with Dr. Harrington Challenger planned and mammogram scheduled oct 1 and have on file from 10/01/2018 6. Colon cancer screening - 05/20/15 with 5 year follow up due to polyps and family history 7. Skin cancer screening- rare dermatology. advised regular sunscreen use. Denies worrisome, changing, or new skin lesions.    8. Birth control/STD check- postmenopausal/monogamous  9. Osteoporosis screening at 40- we will order bone density at this time.  -never smoker  Status of chronic or acute concerns   # history of sepsis due to cellulitis in June 2021- no recurrence. Leg with lymphedema likely increasing risk  # 3 surgeries for bone spurs and then later infection after each one and then later tore achilles and got a staph infection after that. Since cellulitis has had more tenderness along achilles. We discussed possible sports med referral but wants to hold off for now  # Diabetes S: Medication:metformin 1000mg  bid, amaryl 2 mg  CBGs- 172 this Am- feels ran higher due to eating out last night due to pepsi Exercise and diet- see above- concern about dr. Malachi Bonds. Lost 81 year old dog recently Lab Results  Component Value Date   HGBA1C 6.5 (A) 09/08/2019   HGBA1C 9.1 (H) 11/17/2018   HGBA1C 7.6 (A) 03/27/2018   A/P: hopefully controlled- update a1c- may have to adjust meds if a1c above 7.5- otherwise work on lifestyle  #hypertension S: medication: benazepril 10mg  BP Readings from Last 3 Encounters:  01/14/20 124/70  11/13/19 126/72  11/03/19 117/68  A/P: Stable. Continue current medications.   #hyperlipidemia S: Medication:  planned atorvastatin 40mg --> rosuvastatin 40mg  in 09/2019 with LDL over 100 Lab Results  Component Value Date   CHOL 187 11/17/2018   HDL 30.20 (L) 11/17/2018   LDLCALC 105 07/21/2015   LDLDIRECT 101.0 09/08/2019   TRIG (H) 11/17/2018    443.0 Triglyceride is over 400; calculations on Lipids are invalid.   CHOLHDL 6 11/17/2018   A/P: No some confusion based off prior prescriptions-I believe patient and I discussed being on rosuvastatin 40 mg last visit but a few days after our visit someone sent in atorvastatin again and she thinks she is on medicine starting with a- +From AVS:  " The plan is to be on rosuvastatin 40mg . If you have any atorvastatin 40mg  left take two of  these until completed and then take rosuvastatin all the time. If they refill atorvastatin please return it- rosuvastatin only from now on " . We will not change meds other than this change today- recheck LDL next visit  # Fibromyalgia- noted 2008- uses alprazolam for sleep up to 3x a week- this helps her deal with stress and insomnia provoke dby ongoing pain. Reports fatigue and  and some more muscle pains- discussed increasing exercise again.   Recommended follow up: Return in about 4 months (around 05/15/2020) for follow up (if a1c above 7)- or sooner if needed. may stretch to 6 months if a1c less than 7  Lab/Order associations: not fasting   ICD-10-CM   1. Preventative health care  Z00.00 CBC With Differential/Platelet    COMPLETE METABOLIC PANEL WITH GFR    Lipid Panel (Refl)    Hemoglobin A1c    Hemoglobin A1c    CBC With Differential/Platelet    Lipid Panel (Refl)    COMPLETE METABOLIC PANEL WITH GFR    CANCELED: Hemoglobin A1c  2. Hypertension associated with diabetes ([place])  E11.59 CBC With Differential/Platelet   Z61 COMPLETE METABOLIC PANEL WITH GFR    Lipid Panel (Refl)    CBC With Differential/Platelet    Lipid Panel (Refl)    COMPLETE METABOLIC PANEL WITH GFR  3. Type 2 diabetes mellitus without complication, without long-term current use of insulin (HCC)  E11.9 CBC With Differential/Platelet    COMPLETE METABOLIC PANEL WITH GFR    Lipid Panel (Refl)    Hemoglobin A1c    Hemoglobin A1c    CBC With Differential/Platelet    Lipid Panel (Refl)    COMPLETE METABOLIC PANEL WITH GFR    CANCELED: Hemoglobin A1c  4. Hyperlipidemia associated with type 2 diabetes mellitus ([place])  E11.69    E78.5   5. High risk medication use  Z79.899 Vitamin B12    Vitamin B12    CANCELED: Vitamin B12  6. History of total hysterectomy  Z90.710   7. Postmenopausal  Z78.0 DG Bone Density    Meds ordered this encounter  Medications  . rosuvastatin (CRESTOR) 40 MG tablet    Sig: Take 1 tablet  (40 mg total) by mouth daily.    Dispense:  90 tablet    Refill:  3    Return precautions advised.  Garret Reddish, MD

## 2020-01-14 ENCOUNTER — Ambulatory Visit (INDEPENDENT_AMBULATORY_CARE_PROVIDER_SITE_OTHER): Payer: 59 | Admitting: Family Medicine

## 2020-01-14 ENCOUNTER — Encounter: Payer: Self-pay | Admitting: Family Medicine

## 2020-01-14 ENCOUNTER — Other Ambulatory Visit: Payer: Self-pay

## 2020-01-14 VITALS — BP 124/70 | HR 71 | Temp 98.7°F | Ht 62.0 in | Wt 136.0 lb

## 2020-01-14 DIAGNOSIS — I10 Essential (primary) hypertension: Secondary | ICD-10-CM

## 2020-01-14 DIAGNOSIS — Z79899 Other long term (current) drug therapy: Secondary | ICD-10-CM

## 2020-01-14 DIAGNOSIS — D692 Other nonthrombocytopenic purpura: Secondary | ICD-10-CM | POA: Insufficient documentation

## 2020-01-14 DIAGNOSIS — E1169 Type 2 diabetes mellitus with other specified complication: Secondary | ICD-10-CM | POA: Diagnosis not present

## 2020-01-14 DIAGNOSIS — I152 Hypertension secondary to endocrine disorders: Secondary | ICD-10-CM

## 2020-01-14 DIAGNOSIS — Z780 Asymptomatic menopausal state: Secondary | ICD-10-CM

## 2020-01-14 DIAGNOSIS — E785 Hyperlipidemia, unspecified: Secondary | ICD-10-CM

## 2020-01-14 DIAGNOSIS — Z23 Encounter for immunization: Secondary | ICD-10-CM | POA: Diagnosis not present

## 2020-01-14 DIAGNOSIS — E1159 Type 2 diabetes mellitus with other circulatory complications: Secondary | ICD-10-CM | POA: Diagnosis not present

## 2020-01-14 DIAGNOSIS — Z0000 Encounter for general adult medical examination without abnormal findings: Secondary | ICD-10-CM

## 2020-01-14 DIAGNOSIS — E119 Type 2 diabetes mellitus without complications: Secondary | ICD-10-CM | POA: Diagnosis not present

## 2020-01-14 DIAGNOSIS — Z90710 Acquired absence of both cervix and uterus: Secondary | ICD-10-CM

## 2020-01-14 MED ORDER — ROSUVASTATIN CALCIUM 40 MG PO TABS
40.0000 mg | ORAL_TABLET | Freq: Every day | ORAL | 3 refills | Status: DC
Start: 2020-01-14 — End: 2021-01-02

## 2020-01-15 LAB — CBC WITH DIFFERENTIAL/PLATELET
Absolute Monocytes: 530 cells/uL (ref 200–950)
Basophils Absolute: 90 cells/uL (ref 0–200)
Basophils Relative: 0.9 %
Eosinophils Absolute: 870 cells/uL — ABNORMAL HIGH (ref 15–500)
Eosinophils Relative: 8.7 %
HCT: 40.1 % (ref 35.0–45.0)
Hemoglobin: 13.6 g/dL (ref 11.7–15.5)
Lymphs Abs: 2740 cells/uL (ref 850–3900)
MCH: 29.2 pg (ref 27.0–33.0)
MCHC: 33.9 g/dL (ref 32.0–36.0)
MCV: 86.2 fL (ref 80.0–100.0)
MPV: 10.7 fL (ref 7.5–12.5)
Monocytes Relative: 5.3 %
Neutro Abs: 5770 cells/uL (ref 1500–7800)
Neutrophils Relative %: 57.7 %
Platelets: 289 10*3/uL (ref 140–400)
RBC: 4.65 10*6/uL (ref 3.80–5.10)
RDW: 13.8 % (ref 11.0–15.0)
Total Lymphocyte: 27.4 %
WBC: 10 10*3/uL (ref 3.8–10.8)

## 2020-01-15 LAB — COMPLETE METABOLIC PANEL WITH GFR
AG Ratio: 2.5 (calc) (ref 1.0–2.5)
ALT: 15 U/L (ref 6–29)
AST: 17 U/L (ref 10–35)
Albumin: 4.3 g/dL (ref 3.6–5.1)
Alkaline phosphatase (APISO): 81 U/L (ref 37–153)
BUN/Creatinine Ratio: 7 (calc) (ref 6–22)
BUN: 9 mg/dL (ref 7–25)
CO2: 24 mmol/L (ref 20–32)
Calcium: 9.4 mg/dL (ref 8.6–10.4)
Chloride: 107 mmol/L (ref 98–110)
Creat: 1.3 mg/dL — ABNORMAL HIGH (ref 0.50–0.99)
GFR, Est African American: 50 mL/min/{1.73_m2} — ABNORMAL LOW (ref >=60)
GFR, Est Non African American: 43 mL/min/{1.73_m2} — ABNORMAL LOW (ref >=60)
Globulin: 1.7 g/dL (calc) — ABNORMAL LOW (ref 1.9–3.7)
Glucose, Bld: 111 mg/dL — ABNORMAL HIGH (ref 65–99)
Potassium: 4.3 mmol/L (ref 3.5–5.3)
Sodium: 140 mmol/L (ref 135–146)
Total Bilirubin: 0.4 mg/dL (ref 0.2–1.2)
Total Protein: 6 g/dL — ABNORMAL LOW (ref 6.1–8.1)

## 2020-01-15 LAB — VITAMIN B12: Vitamin B-12: 363 pg/mL (ref 200–1100)

## 2020-01-15 LAB — HEMOGLOBIN A1C
Hgb A1c MFr Bld: 6.8 % of total Hgb — ABNORMAL HIGH (ref <5.7)
Mean Plasma Glucose: 148 (calc)
eAG (mmol/L): 8.2 (calc)

## 2020-01-15 LAB — LIPID PANEL (REFL)
Cholesterol: 133 mg/dL (ref <200)
HDL: 36 mg/dL — ABNORMAL LOW (ref >=50)
LDL Cholesterol (Calc): 64 mg/dL (calc)
Non-HDL Cholesterol (Calc): 97 mg/dL (calc) (ref <130)
Total CHOL/HDL Ratio: 3.7 (calc) (ref <5.0)
Triglycerides: 282 mg/dL — ABNORMAL HIGH (ref <150)

## 2020-02-08 ENCOUNTER — Encounter: Payer: Self-pay | Admitting: Family Medicine

## 2020-02-08 ENCOUNTER — Other Ambulatory Visit: Payer: Self-pay

## 2020-02-08 MED ORDER — BENAZEPRIL HCL 10 MG PO TABS
10.0000 mg | ORAL_TABLET | Freq: Every day | ORAL | 1 refills | Status: DC
Start: 2020-02-08 — End: 2020-08-15

## 2020-02-29 ENCOUNTER — Other Ambulatory Visit: Payer: Self-pay | Admitting: Orthopaedic Surgery

## 2020-02-29 DIAGNOSIS — M542 Cervicalgia: Secondary | ICD-10-CM

## 2020-03-20 ENCOUNTER — Ambulatory Visit
Admission: RE | Admit: 2020-03-20 | Discharge: 2020-03-20 | Disposition: A | Discharge status: Home / Self Care | Payer: 59 | Source: Ambulatory Visit | Attending: Orthopaedic Surgery | Admitting: Orthopaedic Surgery

## 2020-03-20 DIAGNOSIS — M542 Cervicalgia: Secondary | ICD-10-CM

## 2020-08-15 ENCOUNTER — Other Ambulatory Visit: Payer: Self-pay | Admitting: Family Medicine

## 2020-08-15 NOTE — Telephone Encounter (Signed)
Schedule an appointment with Dr.Hunter for further refills.

## 2020-08-22 ENCOUNTER — Encounter: Payer: Self-pay | Admitting: Gastroenterology

## 2020-09-05 ENCOUNTER — Telehealth: Payer: Self-pay

## 2020-09-05 NOTE — Telephone Encounter (Signed)
FYI Case manager with [facility] states she is having a hard time reaching patient.    Is requesting that if patient makes an appointment to be seen, that we notify patient to reach out.

## 2020-09-05 NOTE — Telephone Encounter (Signed)
Will do!

## 2020-10-10 ENCOUNTER — Encounter: Payer: Self-pay | Admitting: Family Medicine

## 2020-10-11 ENCOUNTER — Other Ambulatory Visit: Payer: Self-pay

## 2020-10-11 ENCOUNTER — Ambulatory Visit (INDEPENDENT_AMBULATORY_CARE_PROVIDER_SITE_OTHER): Payer: HMO | Admitting: Family Medicine

## 2020-10-11 ENCOUNTER — Encounter: Payer: Self-pay | Admitting: Family Medicine

## 2020-10-11 ENCOUNTER — Telehealth: Payer: Self-pay

## 2020-10-11 VITALS — BP 136/76 | HR 70 | Temp 99.2°F | Ht 62.0 in | Wt 128.4 lb

## 2020-10-11 DIAGNOSIS — E119 Type 2 diabetes mellitus without complications: Secondary | ICD-10-CM

## 2020-10-11 DIAGNOSIS — M797 Fibromyalgia: Secondary | ICD-10-CM | POA: Diagnosis not present

## 2020-10-11 DIAGNOSIS — E1159 Type 2 diabetes mellitus with other circulatory complications: Secondary | ICD-10-CM | POA: Diagnosis not present

## 2020-10-11 DIAGNOSIS — E1169 Type 2 diabetes mellitus with other specified complication: Secondary | ICD-10-CM

## 2020-10-11 DIAGNOSIS — I152 Hypertension secondary to endocrine disorders: Secondary | ICD-10-CM

## 2020-10-11 DIAGNOSIS — Z780 Asymptomatic menopausal state: Secondary | ICD-10-CM

## 2020-10-11 DIAGNOSIS — I890 Lymphedema, not elsewhere classified: Secondary | ICD-10-CM

## 2020-10-11 DIAGNOSIS — E785 Hyperlipidemia, unspecified: Secondary | ICD-10-CM

## 2020-10-11 MED ORDER — ONDANSETRON 4 MG PO TBDP: 4.0000 mg | ORAL_TABLET | Freq: Three times a day (TID) | ORAL | 0 refills | Status: DC | PRN

## 2020-10-11 NOTE — Telephone Encounter (Signed)
On patients last visit patient states Dr. Yong Channel wanted her to have a DEXA scan.  I have scheduled that appt but do not see a order.  Please advise.

## 2020-10-11 NOTE — Patient Instructions (Addendum)
Health Maintenance Due  Topic Date Due  . Prevnar 20 (final pneumonia shot)Eligible, We are currently out of this immunization in office.  Never done  . Zoster Vaccines- Shingrix (1 of 2) Please check with your pharmacy to see if they have the shingrix vaccine. If they do- please get this immunization and update [place] by phone call or mychart with dates you receive the vaccine  Never done  . COVID-19 Vaccine (3 - Pfizer risk 4-dose series) Will schedule this appointment.  10/05/2019  . OPHTHALMOLOGY EXAM Sign release form at checkout.  08/25/2020  . MAMMOGRAM Sign release form at checkout.  09/30/2020   Thanks for doing labs If you have mychart- we will send your results within 3 business days of [place] receiving them.  If you do not have mychart- we will call you about results within 5 business days of [place] receiving them.  *please also note that you will see labs on mychart as soon as they post. I will later go in and write notes on them- will say "notes from Dr. Yong Channel"  Reduce metformin to 500 mg twice daily with meals- if still having nausea let me know and can change to extended release  I want you to consistently use Flonase for the next 3 weeks-if no improvement with the right ear please let us know and we can place a referral to ear nose and throat.  I think your allergies have irritated your eustachian tubes-if you have worsening symptoms please let us know and we can go ahead and refer you.  Recommended follow up: Return in about 4 months (around 02/10/2021) for physical or sooner if needed.

## 2020-10-11 NOTE — Progress Notes (Signed)
Phone [phone] In person visit   Subjective:   Jamie [name] is a 65 y.o. year old very pleasant female patient who presents for/with See problem oriented charting Chief Complaint  Patient presents with  . Laceration    On her right calf, patient states that she cut her leg about 2 weeks ago at first it was healing then she scratched herself in her sleep and her leg has been oozing since. When she woke up Sunday morning her leg was kind of purple.     This visit occurred during the SARS-CoV-2 public health emergency.  Safety protocols were in place, including screening questions prior to the visit, additional usage of staff PPE, and extensive cleaning of exam room while observing appropriate contact time as indicated for disinfecting solutions.   Past Medical History-  Patient Active Problem List   Diagnosis Date Noted  . History of sepsis 11/01/2019    Priority: High  . History of ovarian cancer 03/27/2018    Priority: High  . Diabetes mellitus, type 2 ([place]) 04/25/2007    Priority: High  . Lymphedema of leg 08/27/2011    Priority: Medium  . Hyperlipidemia associated with type 2 diabetes mellitus ([place]) 01/27/2007    Priority: Medium  . Hypertension associated with diabetes ([place]) 01/27/2007    Priority: Medium  . Fibromyalgia 01/27/2007    Priority: Medium  . Bunion of great toe 09/08/2019    Priority: Low  . Rash 10/28/2018    Priority: Low  . Abdominal pain 05/22/2012    Priority: Low  . Nonspecific elevation of levels of transaminase or lactic acid dehydrogenase (LDH) 08/03/2010    Priority: Low  . Allergic rhinitis 01/27/2007    Priority: Low  . GERD 01/27/2007    Priority: Low  . MENOPAUSE-RELATED VASOMOTOR SYMPTOMS, HOT FLASHES 01/27/2007    Priority: Low  . Atypical chest pain 01/27/2007    Priority: Low  . Senile purpura ([place]) 01/14/2020  . AKI (acute kidney injury) ([place])   . History of endometrial cancer 05/12/2019    Medications- reviewed and  updated Current Outpatient Medications  Medication Sig Dispense Refill  . ALPRAZolam (XANAX) 0.5 MG tablet Take 1 tablet (0.5 mg total) by mouth at bedtime as needed. (Patient taking differently: Take 0.5 mg by mouth at bedtime as needed for anxiety or sleep.) 30 tablet 2  . benazepril (LOTENSIN) 10 MG tablet Take 1 tablet (10 mg total) by mouth daily. 30 tablet 1  . cyclobenzaprine (FLEXERIL) 10 MG tablet Take 10 mg by mouth at bedtime.     Marland Kitchen glimepiride (AMARYL) 2 MG tablet TAKE 1 TABLET(2 MG) BY MOUTH DAILY 90 tablet 3  . metFORMIN (GLUCOPHAGE) 1000 MG tablet TAKE 1 TABLET BY MOUTH TWICE A DAY WITH MEALS (Patient taking differently: TAKE 1 TABLET BY MOUTH A DAY) 180 tablet 1  . ondansetron (ZOFRAN ODT) 4 MG disintegrating tablet Take 1 tablet (4 mg total) by mouth every 8 (eight) hours as needed for nausea or vomiting. 20 tablet 0  . rosuvastatin (CRESTOR) 40 MG tablet Take 1 tablet (40 mg total) by mouth daily. 90 tablet 3  . triamcinolone cream (KENALOG) 0.1 % Apply 1 application topically 2 (two) times daily. For 10 days maximum and then take at least 10 day break (Patient taking differently: Apply 1 application topically 2 (two) times daily as needed (to affected area(s)- For 10 days maximum and then take at least 10 day's break).) 30 g 3  . omeprazole (PRILOSEC) 20 MG capsule  Take 20 mg by mouth daily. (Patient not taking: Reported on 10/11/2020)     No current facility-administered medications for this visit.     Objective:  BP 136/76   Pulse 70   Temp 99.2 F (37.3 C) (Temporal)   Ht 5\' 2"  (1.575 m)   Wt 128 lb 6.4 oz (58.2 kg)   LMP  (LMP Unknown)   SpO2 98%   BMI 23.48 kg/m  Gen: NAD, resting comfortably HEENT: slight bulge of right TM without erythema CV: RRR no murmurs rubs or gallops Lungs: CTAB no crackles, wheeze, rhonchi Abdomen: soft/nontender/nondistended/normal bowel sounds. No rebound or guarding.  Ext: no edema Skin: warm, dry Neuro: grossly normal, moves all  extremities Diabetic Foot Exam - Simple   Simple Foot Form Diabetic Foot exam was performed with the following findings: Yes 10/11/2020  4:28 PM  Visual Inspection No deformities, no ulcerations, no other skin breakdown bilaterally: Yes Sensation Testing Intact to touch and monofilament testing bilaterally: Yes Pulse Check Posterior Tibialis and Dorsalis pulse intact bilaterally: Yes Comments        Assessment and Plan   # Laceration on Right Leg  # Lymphedema on Right Lower Extremity S:She was painting on her deck and hit her leg on the stairs. She was cleaning the wound and wrapping it.  She scratched the scab off on Saturday and it was leaking. She had spider like red extensions from the wound as well. There was warmth on Sunday and Monday. Present on the leg with lymphedema-mild edema in right leg but this is common.. Tetanus shot is up-to-date.  She reports symptoms have improved in the last day or 2. A/P: Laceration on right leg appears to be healing-sounds like has some serous drainage.  We discussed warning signs for worsening symptoms  # Right ear hearing Loss S: She was handling her grandson and he yelled in her ear and now she is hard of hearing. She states that it feels as if there is a tunnel. She was experiencing pain before but reports it is improving/essentially resolved. Hearing is also improving slightly-she is using intermittent Flonase A/P: Possible otitis media with effusion-trial Flonsae for 3 weeks, if there is no improvement we will place a referral to ear, nose, and throat provider.  # Diabetes S: Medication: Metformin 1000 mg BID, Amaryl 2 mg- Metformin makes her very nauseas lately and recently reduced the intake to once a day for about a month and she will take it during dinner with plans to do nothing after. CBGs-has had some variations in blood sugar-mostly under 140 but has had occasional up to 200.  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 Down 8 lbs- trying to lose weight and eat a healthier diet Lab Results  Component Value Date   HGBA1C 6.8 (H) 01/14/2020   HGBA1C 6.5 (A) 09/08/2019   HGBA1C 9.1 (H) 11/17/2018    A/P: Hopefully controlled- I am concerned about some of her highs as well as the fact she has had some lows-this was before reducing her dose of metformin in regards to lows.  Aslong as A1c is controlled, change metformin 1000 mg daily to 500 mg twice daily with meals.  She can continue Amaryl for now but if has recurrent low blood sugars she should reach out to [place] and we will consider stopping this -We also discussed potentially changing metformin to extended release which may help with nausea issues-she asked  for a short-term supply of Zofran which I provided  #hyperlipidemia S: Medication: planned Atorvastatin 40mg --> Rosuvastatin 40mg  in 09/2019 with LDL over 100 Lab Results  Component Value Date   CHOL 133 01/14/2020   HDL 36 (L) 01/14/2020   LDLCALC 64 01/14/2020   LDLDIRECT 101.0 09/08/2019   TRIG 282 (H) 01/14/2020   CHOLHDL 3.7 01/14/2020  A/P: Very well controlled on last check in September-not yet due for full lipid panel.  LDL was under ideal goal of 70 or less-continue current medication.   #hypertension S: medication: Benazepril 10 mg BP Readings from Last 3 Encounters:  10/11/20 136/76  01/14/20 124/70  11/13/19 126/72  A/P: Stable. Continue current medications.  # Fibromyalgia- noted 2008- uses alprazolam for sleep up to 2 x a week down from 3 times a week- this helps her deal with stress and insomnia provoked by ongoing pain. Reported fatigue and some more muscle pains- discussed increasing exercise again.   #Prevnar 20-  held off until next visit as we do not have any available  #Shingrix- she wants to wait until she feels better which is understandable  #Colonoscopy- she received a 2-year extension (7  years-2024)  Recommended follow up: Return in about 4 months (around 02/10/2021) for physical or sooner if needed.   Lab/Order associations:   ICD-10-CM   1. Type 2 diabetes mellitus without complication, without long-term current use of insulin (HCC)  E11.9 CBC with Differential/Platelet    Comprehensive metabolic panel    Hemoglobin A1c  2. Hypertension associated with diabetes ([place])  E11.59    I15.2   3. Hyperlipidemia associated with type 2 diabetes mellitus ([place])  E11.69    E78.5   4. Fibromyalgia  M79.7   5. Lymphedema of right lower extremity  I89.0     Meds ordered this encounter  Medications  . ondansetron (ZOFRAN ODT) 4 MG disintegrating tablet    Sig: Take 1 tablet (4 mg total) by mouth every 8 (eight) hours as needed for nausea or vomiting.    Dispense:  20 tablet    Refill:  0   I,Harris Phan,acting as a scribe for Garret Reddish, MD.,have documented all relevant documentation on the behalf of Garret Reddish, MD,as directed by  Garret Reddish, MD while in the presence of Garret Reddish, MD.    I, Garret Reddish, MD, have reviewed all documentation for this visit. The documentation on 10/11/20 for the exam, diagnosis, procedures, and orders are all accurate and complete.   Return precautions advised.  Garret Reddish, MD

## 2020-10-12 ENCOUNTER — Encounter: Payer: Self-pay | Admitting: Family Medicine

## 2020-10-12 DIAGNOSIS — E1122 Type 2 diabetes mellitus with diabetic chronic kidney disease: Secondary | ICD-10-CM | POA: Insufficient documentation

## 2020-10-12 LAB — CBC WITH DIFFERENTIAL/PLATELET
Basophils Absolute: 0.1 10*3/uL (ref 0.0–0.1)
Basophils Relative: 0.9 % (ref 0.0–3.0)
Eosinophils Absolute: 1.2 10*3/uL — ABNORMAL HIGH (ref 0.0–0.7)
Eosinophils Relative: 11.5 % — ABNORMAL HIGH (ref 0.0–5.0)
HCT: 41.8 % (ref 36.0–46.0)
Hemoglobin: 13.9 g/dL (ref 12.0–15.0)
Lymphocytes Relative: 21.6 % (ref 12.0–46.0)
Lymphs Abs: 2.3 10*3/uL (ref 0.7–4.0)
MCHC: 33.3 g/dL (ref 30.0–36.0)
MCV: 87 fl (ref 78.0–100.0)
Monocytes Absolute: 0.6 10*3/uL (ref 0.1–1.0)
Monocytes Relative: 5.5 % (ref 3.0–12.0)
Neutro Abs: 6.3 10*3/uL (ref 1.4–7.7)
Neutrophils Relative %: 60.5 % (ref 43.0–77.0)
Platelets: 270 10*3/uL (ref 150.0–400.0)
RBC: 4.8 Mil/uL (ref 3.87–5.11)
RDW: 14 % (ref 11.5–15.5)
WBC: 10.5 10*3/uL (ref 4.0–10.5)

## 2020-10-12 LAB — COMPREHENSIVE METABOLIC PANEL
ALT: 32 U/L (ref 0–35)
AST: 24 U/L (ref 0–37)
Albumin: 4.4 g/dL (ref 3.5–5.2)
Alkaline Phosphatase: 81 U/L (ref 39–117)
BUN: 9 mg/dL (ref 6–23)
CO2: 24 mEq/L (ref 19–32)
Calcium: 9.4 mg/dL (ref 8.4–10.5)
Chloride: 109 mEq/L (ref 96–112)
Creatinine, Ser: 1.29 mg/dL — ABNORMAL HIGH (ref 0.40–1.20)
GFR: 43.53 mL/min — ABNORMAL LOW (ref >60.00)
Glucose, Bld: 103 mg/dL — ABNORMAL HIGH (ref 70–99)
Potassium: 4.2 mEq/L (ref 3.5–5.1)
Sodium: 140 mEq/L (ref 135–145)
Total Bilirubin: 0.5 mg/dL (ref 0.2–1.2)
Total Protein: 6.5 g/dL (ref 6.0–8.3)

## 2020-10-12 LAB — HEMOGLOBIN A1C: Hgb A1c MFr Bld: 6.7 % — ABNORMAL HIGH (ref 4.6–6.5)

## 2020-10-12 NOTE — Telephone Encounter (Signed)
Order has been placed.

## 2020-10-21 ENCOUNTER — Other Ambulatory Visit: Payer: Self-pay | Admitting: Family Medicine

## 2020-10-26 ENCOUNTER — Other Ambulatory Visit: Payer: Self-pay

## 2020-10-26 ENCOUNTER — Ambulatory Visit (INDEPENDENT_AMBULATORY_CARE_PROVIDER_SITE_OTHER)
Admission: RE | Admit: 2020-10-26 | Discharge: 2020-10-26 | Disposition: A | Discharge status: Home / Self Care | Payer: HMO | Source: Ambulatory Visit | Attending: Family Medicine | Admitting: Family Medicine

## 2020-10-26 DIAGNOSIS — Z780 Asymptomatic menopausal state: Secondary | ICD-10-CM | POA: Diagnosis not present

## 2020-10-31 ENCOUNTER — Encounter: Payer: Self-pay | Admitting: Family Medicine

## 2020-10-31 DIAGNOSIS — M8580 Other specified disorders of bone density and structure, unspecified site: Secondary | ICD-10-CM | POA: Insufficient documentation

## 2020-11-09 ENCOUNTER — Encounter: Payer: Self-pay | Admitting: Family Medicine

## 2020-11-09 ENCOUNTER — Other Ambulatory Visit: Payer: Self-pay

## 2020-11-09 MED ORDER — TRIAMCINOLONE ACETONIDE 0.1 % EX CREA
1.0000 | TOPICAL_CREAM | Freq: Two times a day (BID) | CUTANEOUS | 3 refills | Status: DC
Start: 2020-11-09 — End: 2021-12-07

## 2020-11-14 ENCOUNTER — Other Ambulatory Visit: Payer: Self-pay | Admitting: Family Medicine

## 2020-12-21 ENCOUNTER — Telehealth: Payer: Self-pay

## 2020-12-21 NOTE — Telephone Encounter (Signed)
It depends on the dates on her paper needed for work- please check with patient on this (also would need the same info for any future requests like this for work physicals)  Also has visit 02/10/21- could potentially change that to 40 mins to make cpe

## 2020-12-21 NOTE — Telephone Encounter (Signed)
Ok to use last years CPE? Pt not currently scheduled for CPE for this year.

## 2020-12-21 NOTE — Telephone Encounter (Signed)
Pt called needing a physical for her new job. She had a physical 01/14/20. Can we use that one or does she need a new physical? If we can use the one from last year she would like to come pick up next week.

## 2020-12-21 NOTE — Telephone Encounter (Signed)
See below

## 2020-12-22 NOTE — Telephone Encounter (Signed)
Called and spoke with pt and she will bring the needed form with her when she comes to have her TB skin test on Tuesday.

## 2020-12-22 NOTE — Telephone Encounter (Signed)
Talked to pt and she said that there is no date on her papers and her job will go ahead and take the one from September of last year. She did want to go ahead want to make her appt in October a physical.

## 2020-12-27 ENCOUNTER — Ambulatory Visit: Payer: HMO

## 2020-12-29 ENCOUNTER — Ambulatory Visit: Payer: HMO

## 2020-12-30 ENCOUNTER — Other Ambulatory Visit: Payer: Self-pay | Admitting: Family Medicine

## 2021-01-03 ENCOUNTER — Ambulatory Visit (INDEPENDENT_AMBULATORY_CARE_PROVIDER_SITE_OTHER): Payer: HMO

## 2021-01-03 ENCOUNTER — Other Ambulatory Visit: Payer: Self-pay

## 2021-01-03 DIAGNOSIS — Z111 Encounter for screening for respiratory tuberculosis: Secondary | ICD-10-CM | POA: Diagnosis not present

## 2021-01-03 NOTE — Progress Notes (Signed)
Per orders of Dr. Yong Channel, injection of Tuberculin was given by Tobe Sos in right forearm. Patient tolerated injection well. Patient will make appointment to have site read in 2 days.

## 2021-01-05 ENCOUNTER — Ambulatory Visit: Payer: HMO

## 2021-01-05 ENCOUNTER — Other Ambulatory Visit: Payer: Self-pay

## 2021-01-05 DIAGNOSIS — Z111 Encounter for screening for respiratory tuberculosis: Secondary | ICD-10-CM

## 2021-01-05 LAB — TB SKIN TEST
Induration: 0 mm
TB Skin Test: NEGATIVE

## 2021-01-11 ENCOUNTER — Telehealth: Payer: Self-pay

## 2021-01-11 ENCOUNTER — Encounter: Payer: Self-pay | Admitting: Family Medicine

## 2021-01-11 NOTE — Telephone Encounter (Signed)
Unable to reach patient [facility]. I was calling the patient in reference to her TB testing that she had done on 01/03/2021. Was calling to see if she came back in the office  on 01/05/2021 to have her TB test read.

## 2021-01-12 NOTE — Progress Notes (Signed)
TB Read Negative 0 mm

## 2021-01-13 NOTE — Telephone Encounter (Signed)
This paperwork has been filled out and signed. Patient is aware! This was handled on Wednesday 01/11/2021

## 2021-01-20 ENCOUNTER — Ambulatory Visit: Payer: HMO | Admitting: Family Medicine

## 2021-01-27 ENCOUNTER — Other Ambulatory Visit: Payer: Self-pay | Admitting: Family Medicine

## 2021-02-04 ENCOUNTER — Emergency Department (HOSPITAL_COMMUNITY): Payer: HMO

## 2021-02-04 ENCOUNTER — Emergency Department (HOSPITAL_COMMUNITY)
Admission: EM | Admit: 2021-02-04 | Discharge: 2021-02-04 | Disposition: A | Discharge status: Home / Self Care | Payer: HMO | Attending: Emergency Medicine | Admitting: Emergency Medicine

## 2021-02-04 ENCOUNTER — Ambulatory Visit (HOSPITAL_COMMUNITY)
Admission: EM | Admit: 2021-02-04 | Discharge: 2021-02-04 | Disposition: A | Discharge status: Home / Self Care | Payer: HMO

## 2021-02-04 ENCOUNTER — Encounter (HOSPITAL_COMMUNITY): Payer: Self-pay | Admitting: Emergency Medicine

## 2021-02-04 ENCOUNTER — Other Ambulatory Visit: Payer: Self-pay

## 2021-02-04 DIAGNOSIS — R109 Unspecified abdominal pain: Secondary | ICD-10-CM | POA: Diagnosis not present

## 2021-02-04 DIAGNOSIS — Z8543 Personal history of malignant neoplasm of ovary: Secondary | ICD-10-CM | POA: Insufficient documentation

## 2021-02-04 DIAGNOSIS — R42 Dizziness and giddiness: Secondary | ICD-10-CM | POA: Diagnosis not present

## 2021-02-04 DIAGNOSIS — R112 Nausea with vomiting, unspecified: Secondary | ICD-10-CM

## 2021-02-04 DIAGNOSIS — E1122 Type 2 diabetes mellitus with diabetic chronic kidney disease: Secondary | ICD-10-CM | POA: Diagnosis not present

## 2021-02-04 DIAGNOSIS — Z8542 Personal history of malignant neoplasm of other parts of uterus: Secondary | ICD-10-CM | POA: Diagnosis not present

## 2021-02-04 DIAGNOSIS — Z7984 Long term (current) use of oral hypoglycemic drugs: Secondary | ICD-10-CM | POA: Diagnosis not present

## 2021-02-04 DIAGNOSIS — I129 Hypertensive chronic kidney disease with stage 1 through stage 4 chronic kidney disease, or unspecified chronic kidney disease: Secondary | ICD-10-CM | POA: Insufficient documentation

## 2021-02-04 DIAGNOSIS — R519 Headache, unspecified: Secondary | ICD-10-CM

## 2021-02-04 DIAGNOSIS — U071 COVID-19: Secondary | ICD-10-CM | POA: Insufficient documentation

## 2021-02-04 DIAGNOSIS — N183 Chronic kidney disease, stage 3 unspecified: Secondary | ICD-10-CM | POA: Diagnosis not present

## 2021-02-04 DIAGNOSIS — H66001 Acute suppurative otitis media without spontaneous rupture of ear drum, right ear: Secondary | ICD-10-CM | POA: Insufficient documentation

## 2021-02-04 DIAGNOSIS — Z79899 Other long term (current) drug therapy: Secondary | ICD-10-CM | POA: Diagnosis not present

## 2021-02-04 DIAGNOSIS — H539 Unspecified visual disturbance: Secondary | ICD-10-CM

## 2021-02-04 HISTORY — DX: Disorder of kidney and ureter, unspecified: N28.9

## 2021-02-04 LAB — BASIC METABOLIC PANEL
Anion gap: 12 (ref 5–15)
BUN: 16 mg/dL (ref 8–23)
CO2: 20 mmol/L — ABNORMAL LOW (ref 22–32)
Calcium: 9.1 mg/dL (ref 8.9–10.3)
Chloride: 103 mmol/L (ref 98–111)
Creatinine, Ser: 1.17 mg/dL — ABNORMAL HIGH (ref 0.44–1.00)
GFR, Estimated: 52 mL/min — ABNORMAL LOW (ref >60)
Glucose, Bld: 120 mg/dL — ABNORMAL HIGH (ref 70–99)
Potassium: 3.8 mmol/L (ref 3.5–5.1)
Sodium: 135 mmol/L (ref 135–145)

## 2021-02-04 LAB — RESP PANEL BY RT-PCR (FLU A&B, COVID) ARPGX2
Influenza A by PCR: NEGATIVE
Influenza B by PCR: NEGATIVE
SARS Coronavirus 2 by RT PCR: POSITIVE — AB

## 2021-02-04 LAB — CBC WITH DIFFERENTIAL/PLATELET
Abs Immature Granulocytes: 0.02 10*3/uL (ref 0.00–0.07)
Basophils Absolute: 0 10*3/uL (ref 0.0–0.1)
Basophils Relative: 0 %
Eosinophils Absolute: 0 10*3/uL (ref 0.0–0.5)
Eosinophils Relative: 0 %
HCT: 47 % — ABNORMAL HIGH (ref 36.0–46.0)
Hemoglobin: 15.5 g/dL — ABNORMAL HIGH (ref 12.0–15.0)
Immature Granulocytes: 0 %
Lymphocytes Relative: 20 %
Lymphs Abs: 1 10*3/uL (ref 0.7–4.0)
MCH: 29.2 pg (ref 26.0–34.0)
MCHC: 33 g/dL (ref 30.0–36.0)
MCV: 88.7 fL (ref 80.0–100.0)
Monocytes Absolute: 0.8 10*3/uL (ref 0.1–1.0)
Monocytes Relative: 16 %
Neutro Abs: 3.1 10*3/uL (ref 1.7–7.7)
Neutrophils Relative %: 64 %
Platelets: 228 10*3/uL (ref 150–400)
RBC: 5.3 MIL/uL — ABNORMAL HIGH (ref 3.87–5.11)
RDW: 13.2 % (ref 11.5–15.5)
WBC: 5 10*3/uL (ref 4.0–10.5)
nRBC: 0 % (ref 0.0–0.2)

## 2021-02-04 LAB — TROPONIN I (HIGH SENSITIVITY): Troponin I (High Sensitivity): 12 ng/L (ref <18)

## 2021-02-04 LAB — C-REACTIVE PROTEIN: CRP: 0.5 mg/dL (ref <1.0)

## 2021-02-04 LAB — SEDIMENTATION RATE: Sed Rate: 0 mm/hr (ref 0–22)

## 2021-02-04 MED ORDER — AMOXICILLIN-POT CLAVULANATE 875-125 MG PO TABS: 1.0000 | ORAL_TABLET | Freq: Two times a day (BID) | ORAL | 0 refills | Status: DC

## 2021-02-04 MED ORDER — SODIUM CHLORIDE 0.9 % IV BOLUS
1000.0000 mL | Freq: Once | INTRAVENOUS | Status: AC
  Administered 2021-02-04: 1000 mL via INTRAVENOUS

## 2021-02-04 MED ORDER — PROCHLORPERAZINE EDISYLATE 10 MG/2ML IJ SOLN
10.0000 mg | Freq: Once | INTRAMUSCULAR | Status: AC
  Administered 2021-02-04: 10 mg via INTRAVENOUS
  Filled 2021-02-04: qty 2

## 2021-02-04 MED ORDER — NIRMATRELVIR/RITONAVIR (PAXLOVID) TABLET (RENAL DOSING): 2.0000 | ORAL_TABLET | Freq: Two times a day (BID) | ORAL | 0 refills | Status: AC

## 2021-02-04 MED ORDER — LORAZEPAM 2 MG/ML IJ SOLN: 1.0000 mg | Freq: Once | INTRAMUSCULAR | Status: DC

## 2021-02-04 MED ORDER — DIPHENHYDRAMINE HCL 50 MG/ML IJ SOLN
25.0000 mg | Freq: Once | INTRAMUSCULAR | Status: AC
  Administered 2021-02-04: 25 mg via INTRAVENOUS
  Filled 2021-02-04: qty 1

## 2021-02-04 NOTE — ED Provider Notes (Signed)
[facility] Provider Note   CSN: [id number] Arrival date & time: 02/04/21  1042     History No chief complaint on file.   CHARISH SCHROEPFER is a 65 y.o. female.  HPI     100.4 fever today, was the highest, 99 for last few days Nausea and vomiting Wednesday into Thursday nausea today but no vomiting Wednesday vomited every  30 min, Thursday frequently but not as much, took nausea pills Thursday Headache started Wednesday, worsened THursday, eased off Friday but took tylenol.  Back this morning.  Excrutiating pain, headache, starts in front and goes to back Mild abdominal pain, left side always hurting. Lower back pain, new since this started.  No diarrhea. No constipation but has not gone in 4 days because not eating Ear pain right ear, decreased hearing, for a few weeks Right eye vision fuzzy started Wednesday, eye watering on that side Denies numbness, weakness, difficulty talking or walking, or facial droop. Has to get up slowly, mild lightheadedness when going to stand up, not feeling off balance or having trouble coordinating    Past Medical History:  Diagnosis Date   Allergy    Diabetes mellitus    ORAL MEDS-NO INSULIN   Endometrial cancer ([place]) 03/2011   03/2011   GERD (gastroesophageal reflux disease)    Hiatal hernia    History of cervical dysplasia    Hyperlipidemia    Malignant neoplasm of body of uterus ([place]) 03/21/2011   S.p hysterectomy   Ovarian cancer ([place]) 03/2011   03/2011   PONV (postoperative nausea and vomiting)    ONLY AFTER CERVICAL FUSION--NO PROB WITH OTHER SURGERIES   Renal disorder    Ulcer    history of gastritis    Patient Active Problem List   Diagnosis Date Noted   Osteopenia 10/31/2020   CKD stage 3 due to type 2 diabetes mellitus ([place]) 10/12/2020   Senile purpura ([place]) 01/14/2020   History of sepsis 11/01/2019   AKI (acute kidney injury) ([place])    Bunion of great toe 09/08/2019   History  of endometrial cancer 05/12/2019   Rash 10/28/2018   History of ovarian cancer 03/27/2018   Abdominal pain 05/22/2012   Lymphedema of leg 08/27/2011   Nonspecific elevation of levels of transaminase or lactic acid dehydrogenase (LDH) 08/03/2010   Diabetes mellitus, type 2 ([place]) 04/25/2007   Hyperlipidemia associated with type 2 diabetes mellitus ([place]) 01/27/2007   Hypertension associated with diabetes ([place]) 01/27/2007   Allergic rhinitis 01/27/2007   GERD 01/27/2007   MENOPAUSE-RELATED VASOMOTOR SYMPTOMS, HOT FLASHES 01/27/2007   Fibromyalgia 01/27/2007   Atypical chest pain 01/27/2007    Past Surgical History:  Procedure Laterality Date   ACHILLES TENDON SURGERY     MULTIPLE SURGERIES BECAUSE OF STAPH INFECTION   CERVICAL FUSION  09/2003   GYNECOLOGIC CRYOSURGERY  02/2003   LUMBAR FUSION  11/1998   LYMPH NODE DISSECTION  2012   TOTAL VAGINAL HYSTERECTOMY  03/29/2011     OB History   No obstetric history on file.     Family History  Problem Relation Age of Onset   Diabetes Father    Heart disease Father    Prostate cancer Father    Lymphoma Father    Lung cancer Mother        smoker   Ovarian cancer Cousin    Uterine cancer Cousin    Breast cancer Maternal Aunt    Colon cancer Paternal Grandmother 75   Hemochromatosis Maternal  Grandmother    Diabetes Paternal Grandfather    Lung cancer Sister    Healthy Brother     Social History   Tobacco Use   Smoking status: Never   Smokeless tobacco: Never  Vaping Use   Vaping Use: Never used  Substance Use Topics   Alcohol use: Yes    Alcohol/week: 0.0 standard drinks    Comment: once every 2-3 months   Drug use: Never    Home Medications Prior to Admission medications   Medication Sig Start Date End Date Taking? Authorizing Provider  amoxicillin-clavulanate (AUGMENTIN) 875-125 MG tablet Take 1 tablet by mouth every 12 (twelve) hours. 02/04/21  Yes Gareth Morgan, MD  nirmatrelvir/ritonavir EUA, renal dosing,  (PAXLOVID) 10 x 150 MG & 10 x 100MG  TABS Take 2 tablets by mouth 2 (two) times daily for 5 days. Patient GFR is 52. Take nirmatrelvir (150 mg) one tablet twice daily for 5 days and ritonavir (100 mg) one tablet twice daily for 5 days. 02/04/21 02/09/21 Yes Gareth Morgan, MD  ALPRAZolam Duanne Moron) 0.5 MG tablet Take 1 tablet (0.5 mg total) by mouth at bedtime as needed. Patient taking differently: Take 0.5 mg by mouth at bedtime as needed for anxiety or sleep. 09/08/19   Marin Olp, MD  benazepril (LOTENSIN) 10 MG tablet TAKE 1 TABLET(10 MG) BY MOUTH DAILY 01/27/21   Marin Olp, MD  cyclobenzaprine (FLEXERIL) 10 MG tablet Take 10 mg by mouth at bedtime.     [provider]  glimepiride (AMARYL) 2 MG tablet TAKE 1 TABLET(2 MG) BY MOUTH DAILY 11/14/20   Marin Olp, MD  metFORMIN (GLUCOPHAGE) 1000 MG tablet TAKE 1 TABLET BY MOUTH TWICE A DAY WITH MEALS Patient taking differently: TAKE 1 TABLET BY MOUTH A DAY 11/16/19   Marin Olp, MD  omeprazole (PRILOSEC) 20 MG capsule Take 20 mg by mouth daily. Patient not taking: Reported on 10/11/2020    [provider]  ondansetron (ZOFRAN ODT) 4 MG disintegrating tablet Take 1 tablet (4 mg total) by mouth every 8 (eight) hours as needed for nausea or vomiting. 10/11/20   Marin Olp, MD  rosuvastatin (CRESTOR) 40 MG tablet TAKE 1 TABLET(40 MG) BY MOUTH DAILY 01/02/21   Marin Olp, MD  triamcinolone cream (KENALOG) 0.1 % Apply 1 application topically 2 (two) times daily. For 10 days maximum and then take at least 10 day break 11/09/20   Marin Olp, MD    Allergies    Oxycodone-acetaminophen  Review of Systems   Review of Systems  Constitutional:  Positive for fatigue and fever.  HENT:  Positive for sore throat. Negative for congestion and rhinorrhea.   Eyes:  Positive for discharge and visual disturbance.  Respiratory:  Positive for cough and shortness of breath.   Cardiovascular:  Positive for chest pain.   Gastrointestinal:  Positive for abdominal pain, nausea and vomiting. Negative for constipation and diarrhea.  Genitourinary:  Negative for dysuria.  Musculoskeletal:  Positive for back pain.  Skin:  Negative for rash.  Neurological:  Positive for light-headedness and headaches. Negative for syncope, facial asymmetry, speech difficulty, weakness and numbness.   Physical Exam Updated Vital Signs BP 134/77 (BP Location: Right Arm)   Pulse 89   Temp 98.7 F (37.1 C) (Oral)   Resp 18   Ht 5\' 2"  (1.575 m)   Wt 59.9 kg   LMP  (LMP Unknown)   SpO2 100%   BMI 24.14 kg/m   Physical Exam Vitals  and nursing note reviewed.  Constitutional:      General: She is not in acute distress.    Appearance: Normal appearance. She is well-developed. She is not ill-appearing or diaphoretic.  HENT:     Head: Normocephalic and atraumatic.  Eyes:     General: No visual field deficit.    Extraocular Movements: Extraocular movements intact.     Conjunctiva/sclera: Conjunctivae normal.     Pupils: Pupils are equal, round, and reactive to light.  Cardiovascular:     Rate and Rhythm: Normal rate and regular rhythm.     Pulses: Normal pulses.     Heart sounds: Normal heart sounds. No murmur heard.   No friction rub. No gallop.  Pulmonary:     Effort: Pulmonary effort is normal. No respiratory distress.     Breath sounds: Normal breath sounds. No wheezing or rales.  Abdominal:     General: There is no distension.     Palpations: Abdomen is soft.     Tenderness: There is no abdominal tenderness. There is no guarding.  Musculoskeletal:        General: No swelling or tenderness.     Cervical back: Normal range of motion.  Skin:    General: Skin is warm and dry.     Findings: No erythema or rash.  Neurological:     General: No focal deficit present.     Mental Status: She is alert and oriented to person, place, and time.     GCS: GCS eye subscore is 4. GCS verbal subscore is 5. GCS motor subscore is  6.     Cranial Nerves: No cranial nerve deficit, dysarthria or facial asymmetry.     Sensory: Sensory deficit (reports altered sensation right side of face) present.     Motor: No weakness (initially reported difficulty lifting right leg but repeated and with normal strength) or tremor.     Coordination: Coordination normal. Finger-Nose-Finger Test normal.     Gait: Gait normal.    ED Results / Procedures / Treatments   Labs (all labs ordered are listed, but only abnormal results are displayed) Labs Reviewed  RESP PANEL BY RT-PCR (FLU A&B, COVID) ARPGX2 - Abnormal; Notable for the following components:      Result Value   SARS Coronavirus 2 by RT PCR POSITIVE (*)    All other components within normal limits  CBC WITH DIFFERENTIAL/PLATELET - Abnormal; Notable for the following components:   RBC 5.30 (*)    Hemoglobin 15.5 (*)    HCT 47.0 (*)    All other components within normal limits  BASIC METABOLIC PANEL - Abnormal; Notable for the following components:   CO2 20 (*)    Glucose, Bld 120 (*)    Creatinine, Ser 1.17 (*)    GFR, Estimated 52 (*)    All other components within normal limits  SEDIMENTATION RATE  C-REACTIVE PROTEIN  URINALYSIS, ROUTINE W REFLEX MICROSCOPIC  TROPONIN I (HIGH SENSITIVITY)  TROPONIN I (HIGH SENSITIVITY)    EKG EKG Interpretation  Date/Time:  Saturday February 04 2021 11:01:54 EDT Ventricular Rate:  111 PR Interval:  128 QRS Duration: 78 QT Interval:  342 QTC Calculation: 465 R Axis:   65 Text Interpretation: Sinus tachycardia Right atrial enlargement Anterior infarct , age undetermined Abnormal ECG No significant change since last tracing Confirmed by Gareth Morgan [phone]) on 02/04/2021 11:16:08 AM  Radiology CT Head Wo Contrast  Result Date: 02/04/2021 CLINICAL DATA:  Severe headache for 3 days. Hearing loss  in the right ear. EXAM: CT HEAD WITHOUT CONTRAST TECHNIQUE: Contiguous axial images were obtained from the base of the skull through  the vertex without intravenous contrast. COMPARISON:  None. FINDINGS: Brain: No evidence of acute infarction, hemorrhage, hydrocephalus, extra-axial collection or mass lesion/mass effect. Vascular: There are vascular calcifications in the carotid siphons. Skull: Normal. Negative for fracture or focal lesion. Sinuses/Orbits: No acute finding. Other: None. IMPRESSION: No acute intracranial process. Electronically Signed   By: Zerita Boers M.D.   On: 02/04/2021 12:02   DG Chest Portable 1 View  Result Date: 02/04/2021 CLINICAL DATA:  Shortness of breath. Headache and RIGHT ear pain for 4 days. EXAM: PORTABLE CHEST 1 VIEW COMPARISON:  11/01/2019 FINDINGS: The heart size and mediastinal contours are within normal limits. Both lungs are clear. The visualized skeletal structures are unremarkable. IMPRESSION: No active disease. Electronically Signed   By: Nolon Nations M.D.   On: 02/04/2021 13:15    Procedures Procedures   Medications Ordered in ED Medications  LORazepam (ATIVAN) injection 1 mg (has no administration in time range)  prochlorperazine (COMPAZINE) injection 10 mg (10 mg Intravenous Given 02/04/21 1242)  diphenhydrAMINE (BENADRYL) injection 25 mg (25 mg Intravenous Given 02/04/21 1242)  sodium chloride 0.9 % bolus 1,000 mL (0 mLs Intravenous Stopped 02/04/21 1606)    ED Course  I have reviewed the triage vital signs and the nursing notes.  Pertinent labs & imaging results that were available during my care of the patient were reviewed by me and considered in my medical decision making (see chart for details).    MDM Rules/Calculators/A&P                           65 year old female with a history of diabetes, hyperlipidemia, uterine and ovarian cancer, hypertension, presents with concern for nausea, vomiting, headache, right ear pain, also reporting cough dyspnea and chest pain.  CT head completed in triage shows no evidence of intracranial hemorrhage or any other acute  abnormalities.  Labs show similar renal function to prior, no evidence of diabetic ketoacidosis, no anemia.  EKG was abided by me and shows no acute ST changes or signs of pericarditis.  Troponin is within normal limits and have low suspicion for ACS.  Have low suspicion by history and exam at this time for acute pulmonary embolus.  She does report altered sensation of the right side of her face on my exam, and given this finding as well as cancer history, discussed ordering MRI with and without contrast for further evaluation.  Her COVID test returned positive.  Feel this is likely the etiology of her headache and other symptoms.  She declines MRI at this time and would like to follow-up with her primary care doctor.  In addition, she does have some findings consistent with otitis media on exam.  Given prescription for Augmentin and paxlovid. Advised to stop her statin while she is on paxlovid and for 3 additional days. Recommend PCP follow up. Patient discharged in stable condition with understanding of reasons to return.   Final Clinical Impression(s) / ED Diagnoses Final diagnoses:  Acute suppurative otitis media of right ear without spontaneous rupture of tympanic membrane, recurrence not specified  COVID-19  Nonintractable headache, unspecified chronicity pattern, unspecified headache type    Rx / DC Orders ED Discharge Orders          Ordered    nirmatrelvir/ritonavir EUA, renal dosing, (PAXLOVID) 10 x 150 MG & 10  x 100MG  TABS  2 times daily        02/04/21 1540    amoxicillin-clavulanate (AUGMENTIN) 875-125 MG tablet  Every 12 hours        02/04/21 1540             Gareth Morgan, MD 02/04/21 1635

## 2021-02-04 NOTE — ED Provider Notes (Signed)
[facility]    CSN: [id number] Arrival date & time: 02/04/21  1006      History   Chief Complaint Chief Complaint  Patient presents with   Headache   Emesis   Otalgia   Fever    HPI Jamie [name] is a 65 y.o. female.   Patient presents today for evaluation of severe headache, decreased hearing in her right ear, blurry vision, nausea, and vomiting that she has had for the last few days.  She reports she is also had a fever for about 3 days, and her last temperature was 100.4 Fahrenheit.  She reports she is also had some dizziness.  She does have history of hypertension, stage III kidney disease, and diabetes.  She reports there is a new vein that is protruding to the right side of her temporal scalp as well.   Headache Associated symptoms: dizziness, ear pain, fever, nausea and vomiting   Emesis Associated symptoms: fever and headaches   Otalgia Associated symptoms: fever, headaches and vomiting   Fever Associated symptoms: ear pain, headaches, nausea and vomiting    Past Medical History:  Diagnosis Date   Allergy    Diabetes mellitus    ORAL MEDS-NO INSULIN   Endometrial cancer ([place]) 03/2011   03/2011   GERD (gastroesophageal reflux disease)    Hiatal hernia    History of cervical dysplasia    Hyperlipidemia    Malignant neoplasm of body of uterus ([place]) 03/21/2011   S.p hysterectomy   Ovarian cancer ([place]) 03/2011   03/2011   PONV (postoperative nausea and vomiting)    ONLY AFTER CERVICAL FUSION--NO PROB WITH OTHER SURGERIES   Ulcer    history of gastritis    Patient Active Problem List   Diagnosis Date Noted   Osteopenia 10/31/2020   CKD stage 3 due to type 2 diabetes mellitus ([place]) 10/12/2020   Senile purpura ([place]) 01/14/2020   History of sepsis 11/01/2019   AKI (acute kidney injury) ([place])    Bunion of great toe 09/08/2019   History of endometrial cancer 05/12/2019   Rash 10/28/2018   History of ovarian cancer 03/27/2018   Abdominal  pain 05/22/2012   Lymphedema of leg 08/27/2011   Nonspecific elevation of levels of transaminase or lactic acid dehydrogenase (LDH) 08/03/2010   Diabetes mellitus, type 2 ([place]) 04/25/2007   Hyperlipidemia associated with type 2 diabetes mellitus ([place]) 01/27/2007   Hypertension associated with diabetes ([place]) 01/27/2007   Allergic rhinitis 01/27/2007   GERD 01/27/2007   MENOPAUSE-RELATED VASOMOTOR SYMPTOMS, HOT FLASHES 01/27/2007   Fibromyalgia 01/27/2007   Atypical chest pain 01/27/2007    Past Surgical History:  Procedure Laterality Date   ACHILLES TENDON SURGERY     MULTIPLE SURGERIES BECAUSE OF STAPH INFECTION   CERVICAL FUSION  09/2003   GYNECOLOGIC CRYOSURGERY  02/2003   LUMBAR FUSION  11/1998   LYMPH NODE DISSECTION  2012   TOTAL VAGINAL HYSTERECTOMY  03/29/2011    OB History   No obstetric history on file.      Home Medications    Prior to Admission medications   Medication Sig Start Date End Date Taking? Authorizing Provider  benazepril (LOTENSIN) 10 MG tablet TAKE 1 TABLET(10 MG) BY MOUTH DAILY 01/27/21  Yes Marin Olp, MD  glimepiride (AMARYL) 2 MG tablet TAKE 1 TABLET(2 MG) BY MOUTH DAILY 11/14/20  Yes Marin Olp, MD  ondansetron (ZOFRAN ODT) 4 MG disintegrating tablet Take 1 tablet (4 mg total) by mouth every 8 (eight)  hours as needed for nausea or vomiting. 10/11/20  Yes Marin Olp, MD  rosuvastatin (CRESTOR) 40 MG tablet TAKE 1 TABLET(40 MG) BY MOUTH DAILY 01/02/21  Yes Marin Olp, MD  ALPRAZolam Duanne Moron) 0.5 MG tablet Take 1 tablet (0.5 mg total) by mouth at bedtime as needed. Patient taking differently: Take 0.5 mg by mouth at bedtime as needed for anxiety or sleep. 09/08/19   Marin Olp, MD  cyclobenzaprine (FLEXERIL) 10 MG tablet Take 10 mg by mouth at bedtime.     [provider]  metFORMIN (GLUCOPHAGE) 1000 MG tablet TAKE 1 TABLET BY MOUTH TWICE A DAY WITH MEALS Patient taking differently: TAKE 1 TABLET BY MOUTH A DAY  11/16/19   Marin Olp, MD  omeprazole (PRILOSEC) 20 MG capsule Take 20 mg by mouth daily. Patient not taking: Reported on 10/11/2020    [provider]  triamcinolone cream (KENALOG) 0.1 % Apply 1 application topically 2 (two) times daily. For 10 days maximum and then take at least 10 day break 11/09/20   Marin Olp, MD    Family History Family History  Problem Relation Age of Onset   Diabetes Father    Heart disease Father    Prostate cancer Father    Lymphoma Father    Lung cancer Mother        smoker   Ovarian cancer Cousin    Uterine cancer Cousin    Breast cancer Maternal Aunt    Colon cancer Paternal Grandmother 75   Hemochromatosis Maternal Grandmother    Diabetes Paternal Grandfather    Lung cancer Sister    Healthy Brother     Social History Social History   Tobacco Use   Smoking status: Never   Smokeless tobacco: Never  Vaping Use   Vaping Use: Never used  Substance Use Topics   Alcohol use: Yes    Alcohol/week: 0.0 standard drinks    Comment: once every 2-3 months   Drug use: Never     Allergies   Oxycodone-acetaminophen   Review of Systems Review of Systems  Constitutional:  Positive for fever.  HENT:  Positive for ear pain.   Eyes:  Positive for visual disturbance.  Respiratory:  Negative for shortness of breath.   Gastrointestinal:  Positive for nausea and vomiting.  Neurological:  Positive for dizziness and headaches.    Physical Exam Triage Vital Signs ED Triage Vitals  Enc Vitals Group     BP 02/04/21 1029 135/85     Pulse Rate 02/04/21 1029 (!) 114     Resp --      Temp 02/04/21 1029 98.4 F (36.9 C)     Temp Source 02/04/21 1029 Oral     SpO2 02/04/21 1029 97 %     Weight --      Height --      Head Circumference --      Peak Flow --      Pain Score 02/04/21 1026 8     Pain Loc --      Pain Edu? --      Excl. in [place]? --    No data found.  Updated Vital Signs BP 135/85 (BP Location: Left Arm)   Pulse (!)  114   Temp 98.4 F (36.9 C) (Oral)   LMP  (LMP Unknown)   SpO2 97%     Physical Exam Vitals and nursing note reviewed.  Constitutional:      General: She is in acute distress (  appears uncomfortable).     Appearance: She is well-developed.  HENT:     Head: Normocephalic and atraumatic.  Cardiovascular:     Rate and Rhythm: Tachycardia present.  Pulmonary:     Effort: Pulmonary effort is normal.  Neurological:     Mental Status: She is alert.  Psychiatric:        Mood and Affect: Mood normal.        Behavior: Behavior normal.     UC Treatments / Results  Labs (all labs ordered are listed, but only abnormal results are displayed) Labs Reviewed - No data to display  EKG   Radiology No results found.  Procedures Procedures (including critical care time)  Medications Ordered in UC Medications - No data to display  Initial Impression / Assessment and Plan / UC Course  I have reviewed the triage vital signs and the nursing notes.  Pertinent labs & imaging results that were available during my care of the patient were reviewed by me and considered in my medical decision making (see chart for details).  Suspect likely viral etiology of symptoms, however given her past medical history, severe headache, and vision changes recommended further evaluation in the ED.  I suspect she will need stat labs and imaging.  Patient is agreeable to same and is stable to be transported by her husband [place] the ED [place].  Final Clinical Impressions(s) / UC Diagnoses   Final diagnoses:  Acute nonintractable headache, unspecified headache type  Nausea and vomiting, unspecified vomiting type  Vision changes  Dizziness   Discharge Instructions   None    ED Prescriptions   None    PDMP not reviewed this encounter.   Francene Finders, PA-C 02/04/21 1043

## 2021-02-04 NOTE — ED Triage Notes (Addendum)
Pt reports headache and R ear pain since Wednesday afternoon.  Reports vomiting since Wednesday night- nausea today but no vomiting.  States she can't hear out of R ear and has a "knot" behind L ear.  Reports low grade fever since Wednesday.  Seen at [facility] tested and sent to ED.

## 2021-02-04 NOTE — Discharge Instructions (Addendum)
Stop taking your statin while you are on paxlovid and for 3 more days. Do not take your xanax.

## 2021-02-04 NOTE — ED Provider Notes (Signed)
Emergency Medicine Provider Triage Evaluation Note  Jamie [name] , a 65 y.o. female  was evaluated in triage.  Pt complains of of a headache over the last 4 days.  Is been constant worsening.  Initially started globally but is now primarily on the right temple and forehead.  She reports associated blurred vision in the right eye, nausea and intractable vomiting, and fever.  She also complains of chest pain shortness of breath but denies any abdominal pain, diarrhea, focal weakness/numbness to the upper and lower extremities.  She also does report some trouble chewing.  Review of Systems  Positive:  Negative: See above  Physical Exam  BP (!) 144/86 (BP Location: Left Arm)   Pulse (!) 110   Temp 98.5 F (36.9 C) (Oral)   Resp 20   Ht 5\' 2"  (1.575 m)   Wt 59.9 kg   LMP  (LMP Unknown)   SpO2 100%   BMI 24.14 kg/m  Gen:   Awake, no distress   Resp:  Normal effort  MSK:   Moves extremities without difficulty  Other:    Medical Decision Making  Medically screening exam initiated at 11:00 AM.  Appropriate orders placed.  Earlyne Feeser Froh was informed that the remainder of the evaluation will be completed by another provider, this initial triage assessment does not replace that evaluation, and the importance of remaining in the ED until their evaluation is complete.     Myna Bright [place], PA-C 02/04/21 1103    Gareth Morgan, MD 02/04/21 1635

## 2021-02-04 NOTE — ED Triage Notes (Signed)
Pt c/o of HA and right ear pain x 4 days, vomiting x 2 days and a fever that developed this morning.

## 2021-02-06 NOTE — Progress Notes (Signed)
Phone [phone]   Subjective:  Patient presents today for their annual physical. Chief complaint-noted.   See problem oriented charting- ROS- full  review of systems was completed and negative except for: appetite change with covid, fatigue after covid along with ear pain, sore throat. Hearing loss worse with covid but had been worsening prior, leg swelling always on right leg/lymphedema. Intermittent urinary frequency, back pain, headaches with covid, bruises easily, right hand numbness- has been told carpal tunnel  The following were reviewed and entered/updated in epic: Past Medical History:  Diagnosis Date   Allergy    Diabetes mellitus    ORAL MEDS-NO INSULIN   Endometrial cancer ([place]) 03/2011   03/2011   GERD (gastroesophageal reflux disease)    Hiatal hernia    History of cervical dysplasia    Hyperlipidemia    Malignant neoplasm of body of uterus ([place]) 03/21/2011   S.p hysterectomy   Ovarian cancer ([place]) 03/2011   03/2011   PONV (postoperative nausea and vomiting)    ONLY AFTER CERVICAL FUSION--NO PROB WITH OTHER SURGERIES   Renal disorder    Ulcer    history of gastritis   Patient Active Problem List   Diagnosis Date Noted   History of sepsis 11/01/2019    Priority: 1.   History of ovarian cancer 03/27/2018    Priority: 1.   Diabetes mellitus, type 2 ([place]) 04/25/2007    Priority: 1.   CKD stage 3 due to type 2 diabetes mellitus ([place]) 10/12/2020    Priority: 2.   Lymphedema of leg 08/27/2011    Priority: 2.   Hyperlipidemia associated with type 2 diabetes mellitus ([place]) 01/27/2007    Priority: 2.   Hypertension associated with diabetes ([place]) 01/27/2007    Priority: 2.   Fibromyalgia 01/27/2007    Priority: 2.   Bunion of great toe 09/08/2019    Priority: 3.   Rash 10/28/2018    Priority: 3.   Abdominal pain 05/22/2012    Priority: 3.   Nonspecific elevation of levels of transaminase or lactic acid dehydrogenase (LDH) 08/03/2010    Priority: 3.    Allergic rhinitis 01/27/2007    Priority: 3.   GERD 01/27/2007    Priority: 3.   MENOPAUSE-RELATED VASOMOTOR SYMPTOMS, HOT FLASHES 01/27/2007    Priority: 3.   Atypical chest pain 01/27/2007    Priority: 3.   Osteopenia 10/31/2020   Senile purpura ([place]) 01/14/2020   AKI (acute kidney injury) ([place])    History of endometrial cancer 05/12/2019   Past Surgical History:  Procedure Laterality Date   ACHILLES TENDON SURGERY     MULTIPLE SURGERIES BECAUSE OF STAPH INFECTION   CERVICAL FUSION  09/2003   GYNECOLOGIC CRYOSURGERY  02/2003   LUMBAR FUSION  11/1998   LYMPH NODE DISSECTION  2012   TOTAL VAGINAL HYSTERECTOMY  03/29/2011    Family History  Problem Relation Age of Onset   Diabetes Father    Heart disease Father    Prostate cancer Father    Lymphoma Father    Lung cancer Mother        smoker   Ovarian cancer Cousin    Uterine cancer Cousin    Breast cancer Maternal Aunt    Colon cancer Paternal Grandmother 16   Hemochromatosis Maternal Grandmother    Diabetes Paternal Grandfather    Lung cancer Sister    Healthy Brother     Medications- reviewed and updated Current Outpatient Medications  Medication Sig Dispense Refill   amoxicillin-clavulanate (AUGMENTIN) 875-125  MG tablet Take 1 tablet by mouth every 12 (twelve) hours. 14 tablet 0   benazepril (LOTENSIN) 10 MG tablet TAKE 1 TABLET(10 MG) BY MOUTH DAILY 90 tablet 0   cyclobenzaprine (FLEXERIL) 10 MG tablet Take 10 mg by mouth at bedtime.      glimepiride (AMARYL) 2 MG tablet TAKE 1 TABLET(2 MG) BY MOUTH DAILY 90 tablet 3   ondansetron (ZOFRAN ODT) 4 MG disintegrating tablet Take 1 tablet (4 mg total) by mouth every 8 (eight) hours as needed for nausea or vomiting. 20 tablet 0   rosuvastatin (CRESTOR) 40 MG tablet TAKE 1 TABLET(40 MG) BY MOUTH DAILY 90 tablet 3   triamcinolone cream (KENALOG) 0.1 % Apply 1 application topically 2 (two) times daily. For 10 days maximum and then take at least 10 day break 30 g 3    ALPRAZolam (XANAX) 0.5 MG tablet Take 1 tablet (0.5 mg total) by mouth at bedtime as needed for anxiety or sleep. 30 tablet 2   No current facility-administered medications for this visit.    Allergies-reviewed and updated Allergies  Allergen Reactions   Oxycodone-Acetaminophen Itching, Swelling and Other (See Comments)    Tylox- Face became swollen    Social History   Social History Narrative   Married. 2 children (biological son 83, 1 step son 91 in 2017). 4 grandkids.       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    Retiring age 59.    [place]ground in [place]. Kayaks, canoes, cabins- rents out.       Hobbies: roller skating- works at [employer] too. Also walks regularly. Thinking about doing bicycling on trainer.    Objective  Objective:  BP 114/70   Pulse 70   Temp 98.6 F (37 C) (Temporal)   Ht 5\' 2"  (1.575 m)   Wt 121 lb 6.4 oz (55.1 kg)   LMP  (LMP Unknown)   SpO2 99%   BMI 22.20 kg/m  Gen: NAD, resting comfortably HEENT: Mucous membranes are moist. Oropharynx normal Neck: no thyromegaly CV: RRR no murmurs rubs or gallops Lungs: CTAB no crackles, wheeze, rhonchi Abdomen: soft/nontender/nondistended/normal bowel sounds. No rebound or guarding.  Ext: no edema Skin: warm, dry Neuro: grossly normal, moves all extremities, PERRLA   Assessment and Plan   65 y.o. female presenting for annual physical.  Health Maintenance counseling: 1. Anticipatory guidance: Patient counseled regarding regular dental exams -q6 months, eye exams - yearly,  avoiding smoking and second hand smoke , limiting alcohol to 1 beverage per day , no illicit drugs.   2. Risk factor reduction:  Advised patient of need for regular exercise and diet rich and fruits and vegetables to reduce risk of heart attack and stroke. Exercise- active with school job, rests at home- advised to try  for 150 mins a week outside of work- has new dog so is going to walk some. Diet-has improved diet and eating at home now- weight looks better.  Wt Readings from Last 3 Encounters:  02/10/21 121 lb 6.4 oz (55.1 kg)  02/04/21 132 lb (59.9 kg)  10/11/20 128 lb 6.4 oz (58.2 kg)   3. Immunizations/screenings/ancillary studies Immunization History  Administered Date(s) Administered   Fluad Quad(high Dose 65+) 01/14/2020   Influenza,inj,Quad PF,6+ Mos 03/19/2013, 03/27/2018   PFIZER(Purple Top)SARS-COV-2 Vaccination 08/13/2019, 09/07/2019   PPD Test 01/03/2021   Pneumococcal Polysaccharide-23 04/28/2013  Tdap 07/26/2014   Health Maintenance Due  Topic Date Due   COVID-19 Vaccine (3 - Pfizer risk series)-advised to consider bivalent booster but has to wait 3 months 10/05/2019   OPHTHALMOLOGY EXAM-agrees to be scheduled  08/25/2020   INFLUENZA VACCINE-flu shot declines until she is feeling better 12/05/2020   4. Cervical cancer screening- follows with GYN still-technically pastAge based screening recommendations-does have history of uterine and ovarian cancer. Sees nov 7 5. Breast cancer screening-  breast exam with GYN and mammogram scheduled on November 7-we will have them give this copy 6. Colon cancer screening - on May 20, 2015 with 7-year repeat 7. Skin cancer screening- intermittent derm visitsadvised regular sunscreen use. Denies worrisome, changing, or new skin lesions.  8. Birth control/STD check- monogamous/postmenopausal 9. Osteoporosis screening at 65- low bone density/osteopenia noted earlier this year.  RecommendedWeightbearing exercise/calcium/vitamin D-recheck in 2 years 10. Smoking associated screening -never smoker  Status of chronic or acute concerns   # Urgent care/ED visit for headaches/nausea/vomiting/fever S:  Urgent Care:  -Pt was seen 02/04/21 for an evaluation of headache, emesis, otalgia and fever. She also reported decreased hearing in her right ear and  blurry vision. She does report there was a new vein that was protruding to the right side of her temporal scalp as well. Was advised to be seen in ED.  Pt was seen in ED on 02/04/2021 for an evaluation of covid symptoms. She had a fever of 100.4 the day of visit and 99 for the last few days. She reported nausea and vomiting as well but denied day of visit. Pt took tylenol for headache but came back the next morning. She described excruciating pain, and started from the front and goes to the back. She also had to get up slowly due to mild lightheadedness when going to stand.   From assessment and plan "65 year old female with a history of diabetes, hyperlipidemia, uterine and ovarian cancer, hypertension, presents with concern for nausea, vomiting, headache, right ear pain, also reporting cough dyspnea and chest pain.   CT head completed in triage shows no evidence of intracranial hemorrhage or any other acute abnormalities.  Labs show similar renal function to prior, no evidence of diabetic ketoacidosis, no anemia.  EKG was abided by me and shows no acute ST changes or signs of pericarditis.  Troponin is within normal limits and have low suspicion for ACS.  Have low suspicion by history and exam at this time for acute pulmonary embolus.   She does report altered sensation of the right side of her face on my exam, and given this finding as well as cancer history, discussed ordering MRI with and without contrast for further evaluation.  Her COVID test returned positive.  Feel this is likely the etiology of her headache and other symptoms.  She declines MRI at this time and would like to follow-up with her primary care doctor.  In addition, she does have some findings consistent with otitis media on exam.  Given prescription for Augmentin and paxlovid. Advised to stop her statin while she is on paxlovid and for 3 additional days. Recommend PCP follow up." A/P: she states improving overall- didn't tolerate the  paxlovid (nausea/vomiting/headache- had seemed to already get over this when she started) but finished the antbiotics   # Right ear hearing Loss S: She was handling her grandson and he yelled in her ear and she was hard of hearing. She stated that it felt as if there was a tunnel. She  was experiencing pain before but reported it was improving/essentially resolved. Hearing was also improving slightly-she was using intermittent Flonase  Issues last year but has progressed- had thought could be related to fluid previously.  A/P: with hearing loss progressing over last year- will refer to ENT  # Diabetes S: Medication: Amaryl 2 mg daily -Metformin 1000 mg twice daily last visit- even when she cut in half was still getting bad nausea CBGs- states actually improved despite stopping metformin Exercise and diet- cooking at home more, more active- has helped sugar Lab Results  Component Value Date   HGBA1C 6.7 (H) 10/11/2020   HGBA1C 6.8 (H) 01/14/2020   HGBA1C 6.5 (A) 09/08/2019   A/P: hopefully a1c stable without metformin . She wants to see #s before talking about meds  #hyperlipidemia S: Medication: planned Atorvastatin 40mg --> Rosuvastatin 40mg  daily in 09/2019 with LDL over 100- did great with that change Lab Results  Component Value Date   CHOL 133 01/14/2020   HDL 36 (L) 01/14/2020   LDLCALC 64 01/14/2020   LDLDIRECT 101.0 09/08/2019   TRIG 282 (H) 01/14/2020   CHOLHDL 3.7 01/14/2020   A/P: off rosuvastatin intermittently in last week with covid but otherwise consistent- started back on Wednesday night of this week - update lipids but unliekly to make changes unless really high  #hypertension S: medication: Benazepril 10 mg daily BP Readings from Last 3 Encounters:  02/10/21 114/70  02/04/21 134/77  02/04/21 135/85  A/P: Controlled. Continue current medications.   # Fibromyalgia- slightly worse with more sedentary activity since covid. noted 2008- used alprazolam for sleep up to  2 x a week down from 3 times a week- this helped her deal with stress and insomnia provoked by ongoing pain. She ran out but hasnt needed that often- would like refill.    Recommended follow up: Return in about 6 months (around 08/11/2021) for follow-up or sooner if needed.  Lab/Order associations: NOT fasting   ICD-10-CM   1. Preventative health care  Z00.00 CBC with Differential/Platelet    Comprehensive metabolic panel    Lipid panel    Hemoglobin A1c    2. Hypertension associated with diabetes ([place])  E11.59 CBC with Differential/Platelet   I15.2 Comprehensive metabolic panel    Lipid panel    3. Hyperlipidemia associated with type 2 diabetes mellitus ([facility])  E11.69 CBC with Differential/Platelet   E78.5 Comprehensive metabolic panel    Lipid panel    4. Type 2 diabetes mellitus without complication, without long-term current use of insulin ([facility])  E11.9 Hemoglobin A1c    5. Fibromyalgia  M79.7     6. Senile purpura ([facility]) Chronic D69.2     7. Hearing loss, unspecified hearing loss type, unspecified laterality  H91.90 Ambulatory referral to ENT    Senile purpura stable-continued Mild issues mildIssues with bruising/bleeding Meds ordered this encounter  Medications   ALPRAZolam (XANAX) 0.5 MG tablet    Sig: Take 1 tablet (0.5 mg total) by mouth at bedtime as needed for anxiety or sleep.    Dispense:  30 tablet    Refill:  2     I,Jada Bradford,acting as a scribe for Garret Reddish, MD.,have documented all relevant documentation on the behalf of Garret Reddish, MD,as directed by  Garret Reddish, MD while in the presence of Garret Reddish, MD.   I, Garret Reddish, MD, have reviewed all documentation for this visit. The documentation on 02/10/21 for the exam, diagnosis, procedures, and orders are all accurate and complete.  Return precautions advised.  Garret Reddish, MD

## 2021-02-10 ENCOUNTER — Other Ambulatory Visit: Payer: Self-pay

## 2021-02-10 ENCOUNTER — Ambulatory Visit (INDEPENDENT_AMBULATORY_CARE_PROVIDER_SITE_OTHER): Payer: HMO | Admitting: Family Medicine

## 2021-02-10 ENCOUNTER — Encounter: Payer: Self-pay | Admitting: Family Medicine

## 2021-02-10 VITALS — BP 114/70 | HR 70 | Temp 98.6°F | Ht 62.0 in | Wt 121.4 lb

## 2021-02-10 DIAGNOSIS — E1159 Type 2 diabetes mellitus with other circulatory complications: Secondary | ICD-10-CM | POA: Diagnosis not present

## 2021-02-10 DIAGNOSIS — H9191 Unspecified hearing loss, right ear: Secondary | ICD-10-CM

## 2021-02-10 DIAGNOSIS — D692 Other nonthrombocytopenic purpura: Secondary | ICD-10-CM

## 2021-02-10 DIAGNOSIS — E119 Type 2 diabetes mellitus without complications: Secondary | ICD-10-CM

## 2021-02-10 DIAGNOSIS — E785 Hyperlipidemia, unspecified: Secondary | ICD-10-CM

## 2021-02-10 DIAGNOSIS — I890 Lymphedema, not elsewhere classified: Secondary | ICD-10-CM

## 2021-02-10 DIAGNOSIS — E1169 Type 2 diabetes mellitus with other specified complication: Secondary | ICD-10-CM | POA: Diagnosis not present

## 2021-02-10 DIAGNOSIS — Z0000 Encounter for general adult medical examination without abnormal findings: Secondary | ICD-10-CM

## 2021-02-10 DIAGNOSIS — M797 Fibromyalgia: Secondary | ICD-10-CM

## 2021-02-10 DIAGNOSIS — I152 Hypertension secondary to endocrine disorders: Secondary | ICD-10-CM

## 2021-02-10 DIAGNOSIS — H9190 Unspecified hearing loss, unspecified ear: Secondary | ICD-10-CM

## 2021-02-10 MED ORDER — ALPRAZOLAM 0.5 MG PO TABS: 0.5000 mg | ORAL_TABLET | Freq: Every evening | ORAL | 2 refills | Status: DC | PRN

## 2021-02-10 NOTE — Addendum Note (Signed)
Addended by: Doran Clay A on: 02/10/2021 04:30 PM   Modules accepted: Orders

## 2021-02-10 NOTE — Patient Instructions (Addendum)
Health Maintenance Due  Topic Date Due   COVID-19 Vaccine (3 - Pfizer risk series)   -Please wait 3 months to get the bivalent shot at your local pharmacy.   10/05/2019   OPHTHALMOLOGY EXAM    -Will get this scheduled and update [place] the date.   08/25/2020   MAMMOGRAM   -Scheduled for November 7th ,2022. - She will let them know to fax [place] a copy of the information.    09/30/2020   INFLUENZA VACCINE -we will get flu shot at later date and let us now 12/05/2020   We will call you within two weeks about your referral to ENT. If you do not hear within 2 weeks, give [place] a call.   Please stop by lab before you go If you have mychart- we will send your results within 3 business days of [place] receiving them.  If you do not have mychart- we will call you about results within 5 business days of [place] receiving them.  *please also note that you will see labs on mychart as soon as they post. I will later go in and write notes on them- will say "notes from Dr. Yong Channel"  Recommended follow up: Return in about 6 months (around 08/11/2021) for follow-up or sooner if needed.   Please keep up  the great work with exercising!I do recommend 150 minutes a week!

## 2021-02-11 ENCOUNTER — Other Ambulatory Visit: Payer: Self-pay | Admitting: Family Medicine

## 2021-02-11 LAB — CBC WITH DIFFERENTIAL/PLATELET
Absolute Monocytes: 542 cells/uL (ref 200–950)
Basophils Absolute: 48 cells/uL (ref 0–200)
Basophils Relative: 0.5 %
Eosinophils Absolute: 1036 cells/uL — ABNORMAL HIGH (ref 15–500)
Eosinophils Relative: 10.9 %
HCT: 39 % (ref 35.0–45.0)
Hemoglobin: 13.3 g/dL (ref 11.7–15.5)
Lymphs Abs: 2290 cells/uL (ref 850–3900)
MCH: 30.2 pg (ref 27.0–33.0)
MCHC: 34.1 g/dL (ref 32.0–36.0)
MCV: 88.4 fL (ref 80.0–100.0)
MPV: 10.4 fL (ref 7.5–12.5)
Monocytes Relative: 5.7 %
Neutro Abs: 5586 cells/uL (ref 1500–7800)
Neutrophils Relative %: 58.8 %
Platelets: 236 10*3/uL (ref 140–400)
RBC: 4.41 10*6/uL (ref 3.80–5.10)
RDW: 13.2 % (ref 11.0–15.0)
Total Lymphocyte: 24.1 %
WBC: 9.5 10*3/uL (ref 3.8–10.8)

## 2021-02-11 LAB — LIPID PANEL
Cholesterol: 162 mg/dL (ref <200)
HDL: 30 mg/dL — ABNORMAL LOW (ref >=50)
Non-HDL Cholesterol (Calc): 132 mg/dL (calc) — ABNORMAL HIGH (ref <130)
Total CHOL/HDL Ratio: 5.4 (calc) — ABNORMAL HIGH (ref <5.0)
Triglycerides: 492 mg/dL — ABNORMAL HIGH (ref <150)

## 2021-02-11 LAB — COMPREHENSIVE METABOLIC PANEL
AG Ratio: 2.2 (calc) (ref 1.0–2.5)
ALT: 29 U/L (ref 6–29)
AST: 20 U/L (ref 10–35)
Albumin: 4.1 g/dL (ref 3.6–5.1)
Alkaline phosphatase (APISO): 70 U/L (ref 37–153)
BUN: 13 mg/dL (ref 7–25)
CO2: 25 mmol/L (ref 20–32)
Calcium: 9.1 mg/dL (ref 8.6–10.4)
Chloride: 108 mmol/L (ref 98–110)
Creat: 1 mg/dL (ref 0.50–1.05)
Globulin: 1.9 g/dL (calc) (ref 1.9–3.7)
Glucose, Bld: 91 mg/dL (ref 65–99)
Potassium: 3.8 mmol/L (ref 3.5–5.3)
Sodium: 141 mmol/L (ref 135–146)
Total Bilirubin: 0.5 mg/dL (ref 0.2–1.2)
Total Protein: 6 g/dL — ABNORMAL LOW (ref 6.1–8.1)

## 2021-02-11 LAB — HEMOGLOBIN A1C
Hgb A1c MFr Bld: 6.1 % of total Hgb — ABNORMAL HIGH (ref <5.7)
Mean Plasma Glucose: 128 mg/dL
eAG (mmol/L): 7.1 mmol/L

## 2021-03-10 ENCOUNTER — Encounter: Payer: Self-pay | Admitting: Family Medicine

## 2021-04-20 LAB — HM DIABETES EYE EXAM

## 2021-04-24 ENCOUNTER — Other Ambulatory Visit: Payer: Self-pay | Admitting: Family Medicine

## 2021-04-25 ENCOUNTER — Encounter: Payer: Self-pay | Admitting: Family Medicine

## 2021-07-14 NOTE — Progress Notes (Incomplete)
Phone [phone] In person visit   Subjective:   Jamie [name] is a 66 y.o. year old very pleasant female patient who presents for/with See problem oriented charting No chief complaint on file.   This visit occurred during the SARS-CoV-2 public health emergency.  Safety protocols were in place, including screening questions prior to the visit, additional usage of staff PPE, and extensive cleaning of exam room while observing appropriate contact time as indicated for disinfecting solutions.   Past Medical History-  Patient Active Problem List   Diagnosis Date Noted   Osteopenia 10/31/2020   CKD stage 3 due to type 2 diabetes mellitus ([place]) 10/12/2020   Senile purpura ([place]) 01/14/2020   History of sepsis 11/01/2019   AKI (acute kidney injury) ([place])    Bunion of great toe 09/08/2019   History of endometrial cancer 05/12/2019   Rash 10/28/2018   History of ovarian cancer 03/27/2018   Abdominal pain 05/22/2012   Lymphedema of leg 08/27/2011   Nonspecific elevation of levels of transaminase or lactic acid dehydrogenase (LDH) 08/03/2010   Diabetes mellitus, type 2 ([place]) 04/25/2007   Hyperlipidemia associated with type 2 diabetes mellitus ([place]) 01/27/2007   Hypertension associated with diabetes ([place]) 01/27/2007   Allergic rhinitis 01/27/2007   GERD 01/27/2007   MENOPAUSE-RELATED VASOMOTOR SYMPTOMS, HOT FLASHES 01/27/2007   Fibromyalgia 01/27/2007   Atypical chest pain 01/27/2007    Medications- reviewed and updated Current Outpatient Medications  Medication Sig Dispense Refill   ALPRAZolam (XANAX) 0.5 MG tablet Take 1 tablet (0.5 mg total) by mouth at bedtime as needed for anxiety or sleep. 30 tablet 2   amoxicillin-clavulanate (AUGMENTIN) 875-125 MG tablet Take 1 tablet by mouth every 12 (twelve) hours. 14 tablet 0   benazepril (LOTENSIN) 10 MG tablet TAKE 1 TABLET(10 MG) BY MOUTH DAILY 90 tablet 0   cyclobenzaprine (FLEXERIL) 10 MG tablet Take 10 mg by mouth at bedtime.       glimepiride (AMARYL) 2 MG tablet TAKE 1 TABLET(2 MG) BY MOUTH DAILY 90 tablet 3   ondansetron (ZOFRAN ODT) 4 MG disintegrating tablet Take 1 tablet (4 mg total) by mouth every 8 (eight) hours as needed for nausea or vomiting. 20 tablet 0   rosuvastatin (CRESTOR) 40 MG tablet TAKE 1 TABLET(40 MG) BY MOUTH DAILY 90 tablet 3   triamcinolone cream (KENALOG) 0.1 % Apply 1 application topically 2 (two) times daily. For 10 days maximum and then take at least 10 day break 30 g 3   No current facility-administered medications for this visit.     Objective:  LMP  (LMP Unknown)  Gen: NAD, resting comfortably CV: RRR no murmurs rubs or gallops Lungs: CTAB no crackles, wheeze, rhonchi Abdomen: soft/nontender/nondistended/normal bowel sounds. No rebound or guarding.  Ext: no edema Skin: warm, dry Neuro: grossly normal, moves all extremities  ***    Assessment and Plan   #hyperlipidemia S: Medication: Rosuvastatin '40mg'$  daily in 09/2019 with LDL over 100- did great with that change atorvastatin '40mg'$  in the past.  Lab Results  Component Value Date   CHOL 162 02/10/2021   HDL 30 (L) 02/10/2021   [place]  02/10/2021     Comment:     . LDL cholesterol not calculated. Triglyceride levels greater than 400 mg/dL invalidate calculated LDL results. . Reference range: <100 . Desirable range <100 mg/dL for primary prevention;   <70 mg/dL for patients with CHD or diabetic patients  with > or = 2 CHD risk factors. Marland Kitchen LDL-C is now calculated  using the Martin-Hopkins  calculation, which is a validated novel method providing  better accuracy than the Friedewald equation in the  estimation of LDL-C.  Cresenciano Genre et al. Annamaria Helling. 7116;579(03): 2061-2068  (http://education.[employer].com/faq/FAQ164)    LDLDIRECT 101.0 09/08/2019   TRIG 492 (H) 02/10/2021   CHOLHDL 5.4 (H) 02/10/2021   A/P: ***  # Low Bone density (formerly osteopenia) S: Last DEXA: ***October 26, 2020  Your hip fracture risk  is 3.8% which is above the 3% desired before considering medications that are more advanced such as Fosamax-if you would like to discuss Fosamax we can certainly do this at a visit or we can buckle down calcium, vitamin D, weightbearing exercise and recheck in 2 years***   Calcium: '1200mg'$  (through diet ok) recommended *** Vitamin D: 1000 units a day recommended*** A/P: ***   # Diabetes S: Medication: Amaryl 2 mg daily -Metformin 1000 mg twice daily last visit- even when she cut in half was still getting bad nausea CBGs- *** Exercise and diet- *** Lab Results  Component Value Date   HGBA1C 6.1 (H) 02/10/2021   HGBA1C 6.7 (H) 10/11/2020   HGBA1C 6.8 (H) 01/14/2020    A/P: ***   #hypertension S: medication: Benazepril 10 mg daily Home readings #s: *** BP Readings from Last 3 Encounters:  02/10/21 114/70  02/04/21 134/77  02/04/21 135/85  A/P: ***   # Fibromyalgia- slightly worsened with more sedentary activity since covid. noted 2008- used alprazolam for sleep up to 2 x a week down from 3 times a week- this helped her deal with stress and insomnia provoked by ongoing pain. She ran out but hasnt needed that often- would like refill.   Health Maintenance Due  Topic Date Due   Zoster Vaccines- Shingrix (1 of 2) Never done   Pneumonia Vaccine 73+ Years old (2 - PCV) 04/28/2014   COVID-19 Vaccine (3 - Pfizer risk series) 10/05/2019   MAMMOGRAM  09/30/2020   Recommended follow up: No follow-ups on file. Future Appointments  Date Time Provider [place]  08/25/2021  3:40 PM Marin Olp, MD [facility]    Lab/Order associations: No diagnosis found.  No orders of the defined types were placed in this encounter.   Return precautions advised.  Burnett Corrente

## 2021-07-24 ENCOUNTER — Other Ambulatory Visit: Payer: Self-pay | Admitting: Family Medicine

## 2021-08-25 ENCOUNTER — Ambulatory Visit (INDEPENDENT_AMBULATORY_CARE_PROVIDER_SITE_OTHER): Payer: Medicare Other | Admitting: Family Medicine

## 2021-08-25 ENCOUNTER — Encounter: Payer: Self-pay | Admitting: Family Medicine

## 2021-08-25 VITALS — BP 100/62 | HR 71 | Temp 98.1°F | Ht 62.0 in | Wt 140.0 lb

## 2021-08-25 DIAGNOSIS — Z23 Encounter for immunization: Secondary | ICD-10-CM

## 2021-08-25 DIAGNOSIS — E119 Type 2 diabetes mellitus without complications: Secondary | ICD-10-CM | POA: Diagnosis not present

## 2021-08-25 DIAGNOSIS — M797 Fibromyalgia: Secondary | ICD-10-CM | POA: Diagnosis not present

## 2021-08-25 DIAGNOSIS — I10 Essential (primary) hypertension: Secondary | ICD-10-CM | POA: Diagnosis not present

## 2021-08-25 DIAGNOSIS — E785 Hyperlipidemia, unspecified: Secondary | ICD-10-CM

## 2021-08-25 DIAGNOSIS — D692 Other nonthrombocytopenic purpura: Secondary | ICD-10-CM

## 2021-08-25 DIAGNOSIS — E1169 Type 2 diabetes mellitus with other specified complication: Secondary | ICD-10-CM

## 2021-08-25 MED ORDER — PREDNISONE 20 MG PO TABS: ORAL_TABLET | ORAL | 0 refills | Status: DC

## 2021-08-25 NOTE — Patient Instructions (Addendum)
Please stop by lab before you go ?If you have mychart- we will send your results within 3 business days of [place] receiving them.  ?If you do not have mychart- we will call you about results within 5 business days of [place] receiving them.  ?*please also note that you will see labs on mychart as soon as they post. I will later go in and write notes on them- will say "notes from Dr. Yong Channel"  ? ?Sign release of information at the check out desk for mammogram ? ?- trial of prednisone (if a1c 6.5 or less) for possible cervical disc issue but if this doesn't help or if worsens- recommend follow up with Dr. Patrice Paradise.  ? ?Prevnar 20 today ? ?Recommended follow up: Return in about 6 months (around 02/24/2022) for physical or sooner if needed.Schedule b4 you leave. ?

## 2021-08-26 LAB — CBC WITH DIFFERENTIAL/PLATELET
Absolute Monocytes: 642 cells/uL (ref 200–950)
Basophils Absolute: 97 cells/uL (ref 0–200)
Basophils Relative: 1.1 %
Eosinophils Absolute: 871 cells/uL — ABNORMAL HIGH (ref 15–500)
Eosinophils Relative: 9.9 %
HCT: 40.9 % (ref 35.0–45.0)
Hemoglobin: 13.7 g/dL (ref 11.7–15.5)
Lymphs Abs: 2596 cells/uL (ref 850–3900)
MCH: 30.1 pg (ref 27.0–33.0)
MCHC: 33.5 g/dL (ref 32.0–36.0)
MCV: 89.9 fL (ref 80.0–100.0)
MPV: 10.3 fL (ref 7.5–12.5)
Monocytes Relative: 7.3 %
Neutro Abs: 4594 cells/uL (ref 1500–7800)
Neutrophils Relative %: 52.2 %
Platelets: 233 10*3/uL (ref 140–400)
RBC: 4.55 10*6/uL (ref 3.80–5.10)
RDW: 13.1 % (ref 11.0–15.0)
Total Lymphocyte: 29.5 %
WBC: 8.8 10*3/uL (ref 3.8–10.8)

## 2021-08-26 LAB — COMPREHENSIVE METABOLIC PANEL
AG Ratio: 2.5 (calc) (ref 1.0–2.5)
ALT: 25 U/L (ref 6–29)
AST: 18 U/L (ref 10–35)
Albumin: 4.2 g/dL (ref 3.6–5.1)
Alkaline phosphatase (APISO): 59 U/L (ref 37–153)
BUN/Creatinine Ratio: 15 (calc) (ref 6–22)
BUN: 16 mg/dL (ref 7–25)
CO2: 24 mmol/L (ref 20–32)
Calcium: 9.6 mg/dL (ref 8.6–10.4)
Chloride: 106 mmol/L (ref 98–110)
Creat: 1.09 mg/dL — ABNORMAL HIGH (ref 0.50–1.05)
Globulin: 1.7 g/dL (calc) — ABNORMAL LOW (ref 1.9–3.7)
Glucose, Bld: 88 mg/dL (ref 65–99)
Potassium: 4.2 mmol/L (ref 3.5–5.3)
Sodium: 140 mmol/L (ref 135–146)
Total Bilirubin: 0.6 mg/dL (ref 0.2–1.2)
Total Protein: 5.9 g/dL — ABNORMAL LOW (ref 6.1–8.1)

## 2021-08-26 LAB — HEMOGLOBIN A1C
Hgb A1c MFr Bld: 6.3 % of total Hgb — ABNORMAL HIGH (ref <5.7)
Mean Plasma Glucose: 134 mg/dL
eAG (mmol/L): 7.4 mmol/L

## 2021-08-29 ENCOUNTER — Telehealth: Payer: Self-pay | Admitting: Family Medicine

## 2021-08-29 NOTE — Telephone Encounter (Signed)
Copied from [place] [id number]. Topic: Medicare AWV ?>> Aug 29, 2021 10:36 AM Harris-Coley, Hannah Beat wrote: ?Reason for CRM: LVM 08/29/21 to r/s AWV.  New date 4/28/2 '@230pm'$ .  Please confirm khc ?

## 2021-08-31 ENCOUNTER — Ambulatory Visit: Payer: Medicare Other

## 2021-09-01 ENCOUNTER — Ambulatory Visit (INDEPENDENT_AMBULATORY_CARE_PROVIDER_SITE_OTHER): Payer: Medicare Other

## 2021-09-01 DIAGNOSIS — Z0000 Encounter for general adult medical examination without abnormal findings: Secondary | ICD-10-CM | POA: Diagnosis not present

## 2021-09-01 NOTE — Patient Instructions (Signed)
Jamie [name] , ?Thank you for taking time to come for your Medicare Wellness Visit. I appreciate your ongoing commitment to your health goals. Please review the following plan we discussed and let me know if I can assist you in the future.  ? ?Screening recommendations/referrals: ?Colonoscopy: Done 05/20/15 repeat every 7 years  ?Mammogram: Done 07/19/21 repeat every year  ?Bone Density: Done 10/26/20 rpeat every 2 years  ?Recommended yearly ophthalmology/optometry visit for glaucoma screening and checkup ?Recommended yearly dental visit for hygiene and checkup ? ?Vaccinations: ?Influenza vaccine: Due  ?Pneumococcal vaccine: Up to date ?Tdap vaccine: Done 07/26/14 repeat every 10 years  ?Shingles vaccine: Shingrix discussed. Please contact your pharmacy for coverage information.    ?Covid-19:Completed 4/8, 09/07/19 ? ?Advanced directives: Advance directive discussed with you today. I have provided a copy for you to complete at home and have notarized. Once this is complete please bring a copy in to our office so we can scan it into your chart. ? ?Conditions/risks identified: Lose 12 lbs  ? ?Next appointment: Follow up in one year for your annual wellness visit  ? ? ?Preventive Care 33 Years and Older, Female ?Preventive care refers to lifestyle choices and visits with your health care provider that can promote health and wellness. ?What does preventive care include? ?A yearly physical exam. This is also called an annual well check. ?Dental exams once or twice a year. ?Routine eye exams. Ask your health care provider how often you should have your eyes checked. ?Personal lifestyle choices, including: ?Daily care of your teeth and gums. ?Regular physical activity. ?Eating a healthy diet. ?Avoiding tobacco and drug use. ?Limiting alcohol use. ?Practicing safe sex. ?Taking low-dose aspirin every day. ?Taking vitamin and mineral supplements as recommended by your health care provider. ?What happens during an annual well  check? ?The services and screenings done by your health care provider during your annual well check will depend on your age, overall health, lifestyle risk factors, and family history of disease. ?Counseling  ?Your health care provider may ask you questions about your: ?Alcohol use. ?Tobacco use. ?Drug use. ?Emotional well-being. ?Home and relationship well-being. ?Sexual activity. ?Eating habits. ?History of falls. ?Memory and ability to understand (cognition). ?Work and work Statistician. ?Reproductive health. ?Screening  ?You may have the following tests or measurements: ?Height, weight, and BMI. ?Blood pressure. ?Lipid and cholesterol levels. These may be checked every 5 years, or more frequently if you are over 6 years old. ?Skin check. ?Lung cancer screening. You may have this screening every year starting at age 50 if you have a 30-pack-year history of smoking and currently smoke or have quit within the past 15 years. ?Fecal occult blood test (FOBT) of the stool. You may have this test every year starting at age 13. ?Flexible sigmoidoscopy or colonoscopy. You may have a sigmoidoscopy every 5 years or a colonoscopy every 10 years starting at age 53. ?Hepatitis C blood test. ?Hepatitis B blood test. ?Sexually transmitted disease (STD) testing. ?Diabetes screening. This is done by checking your blood sugar (glucose) after you have not eaten for a while (fasting). You may have this done every 1-3 years. ?Bone density scan. This is done to screen for osteoporosis. You may have this done starting at age 29. ?Mammogram. This may be done every 1-2 years. Talk to your health care provider about how often you should have regular mammograms. ?Talk with your health care provider about your test results, treatment options, and if necessary, the need for more tests. ?Vaccines  ?  Your health care provider may recommend certain vaccines, such as: ?Influenza vaccine. This is recommended every year. ?Tetanus, diphtheria, and  acellular pertussis (Tdap, Td) vaccine. You may need a Td booster every 10 years. ?Zoster vaccine. You may need this after age 35. ?Pneumococcal 13-valent conjugate (PCV13) vaccine. One dose is recommended after age 36. ?Pneumococcal polysaccharide (PPSV23) vaccine. One dose is recommended after age 5. ?Talk to your health care provider about which screenings and vaccines you need and how often you need them. ?This information is not intended to replace advice given to you by your health care provider. Make sure you discuss any questions you have with your health care provider. ?Document Released: 05/20/2015 Document Revised: 01/11/2016 Document Reviewed: 02/22/2015 ?Elsevier Interactive Patient Education ? 2017 [place]. ? ?Fall Prevention in the Home ?Falls can cause injuries. They can happen to people of all ages. There are many things you can do to make your home safe and to help prevent falls. ?What can I do on the outside of my home? ?Regularly fix the edges of walkways and driveways and fix any cracks. ?Remove anything that might make you trip as you walk through a door, such as a raised step or threshold. ?Trim any bushes or trees on the path to your home. ?Use bright outdoor lighting. ?Clear any walking paths of anything that might make someone trip, such as rocks or tools. ?Regularly check to see if handrails are loose or broken. Make sure that both sides of any steps have handrails. ?Any raised decks and porches should have guardrails on the edges. ?Have any leaves, snow, or ice cleared regularly. ?Use sand or salt on walking paths during winter. ?Clean up any spills in your garage right away. This includes oil or grease spills. ?What can I do in the bathroom? ?Use night lights. ?Install grab bars by the toilet and in the tub and shower. Do not use towel bars as grab bars. ?Use non-skid mats or decals in the tub or shower. ?If you need to sit down in the shower, use a plastic, non-slip stool. ?Keep  the floor dry. Clean up any water that spills on the floor as soon as it happens. ?Remove soap buildup in the tub or shower regularly. ?Attach bath mats securely with double-sided non-slip rug tape. ?Do not have throw rugs and other things on the floor that can make you trip. ?What can I do in the bedroom? ?Use night lights. ?Make sure that you have a light by your bed that is easy to reach. ?Do not use any sheets or blankets that are too big for your bed. They should not hang down onto the floor. ?Have a firm chair that has side arms. You can use this for support while you get dressed. ?Do not have throw rugs and other things on the floor that can make you trip. ?What can I do in the kitchen? ?Clean up any spills right away. ?Avoid walking on wet floors. ?Keep items that you use a lot in easy-to-reach places. ?If you need to reach something above you, use a strong step stool that has a grab bar. ?Keep electrical cords out of the way. ?Do not use floor polish or wax that makes floors slippery. If you must use wax, use non-skid floor wax. ?Do not have throw rugs and other things on the floor that can make you trip. ?What can I do with my stairs? ?Do not leave any items on the stairs. ?Make sure that there are  handrails on both sides of the stairs and use them. Fix handrails that are broken or loose. Make sure that handrails are as long as the stairways. ?Check any carpeting to make sure that it is firmly attached to the stairs. Fix any carpet that is loose or worn. ?Avoid having throw rugs at the top or bottom of the stairs. If you do have throw rugs, attach them to the floor with carpet tape. ?Make sure that you have a light switch at the top of the stairs and the bottom of the stairs. If you do not have them, ask someone to add them for you. ?What else can I do to help prevent falls? ?Wear shoes that: ?Do not have high heels. ?Have rubber bottoms. ?Are comfortable and fit you well. ?Are closed at the toe. Do not  wear sandals. ?If you use a stepladder: ?Make sure that it is fully opened. Do not climb a closed stepladder. ?Make sure that both sides of the stepladder are locked into place. ?Ask someone to hold it for you,

## 2021-09-01 NOTE — Progress Notes (Addendum)
Virtual Visit via Telephone Note ? ?I connected with  Jamie [name] on 09/01/21 at  2:30 PM EDT by telephone and verified that I am speaking with the correct person using two identifiers. ? ?Medicare Annual Wellness visit completed telephonically due to Covid-19 pandemic.  ? ?Persons participating in this call: This Health Coach and this patient.  ? ?Location: ?Patient: Home ?Provider: Office ?  ?I discussed the limitations, risks, security and privacy concerns of performing an evaluation and management service by telephone and the availability of in person appointments. The patient expressed understanding and agreed to proceed. ? ?Unable to perform video visit due to video visit attempted and failed and/or patient does not have video capability.  ? ?Some vital signs may be absent or patient reported.  ? ?Willette Brace, LPN ? ? ?Subjective:  ? Jamie [name] is a 66 y.o. female who presents for an Initial Medicare Annual Wellness Visit. ? ?Review of Systems    ? ?Cardiac Risk Factors include: advanced age (>33mn, >>5women);dyslipidemia;hypertension;diabetes mellitus ? ?   ?Objective:  ?  ?There were no vitals filed for this visit. ?There is no height or weight on file to calculate BMI. ? ? ?  09/01/2021  ?  2:39 PM 11/01/2019  ?  3:29 PM 04/15/2018  ? 12:10 PM 05/04/2016  ? 11:54 AM 05/06/2015  ?  7:57 AM 03/20/2011  ?  8:00 PM 03/14/2011  ? 11:06 AM  ?Advanced Directives  ?Does Patient Have a Medical Advance Directive? No No No No No Patient does not have advance directive Patient does not have advance directive;Patient would not like information  ?Does patient want to make changes to medical advance directive? Yes (MAU/Ambulatory/Procedural Areas - Information given)        ?Would patient like information on creating a medical advance directive?  No - Patient declined Yes (MAU/Ambulatory/Procedural Areas - Information given)      ?Pre-existing out of facility DNR order (yellow form or pink MOST form)       No   ? ? ?Current Medications (verified) ?Outpatient Encounter Medications as of 09/01/2021  ?Medication Sig  ? ALPRAZolam (XANAX) 0.5 MG tablet Take 1 tablet (0.5 mg total) by mouth at bedtime as needed for anxiety or sleep.  ? benazepril (LOTENSIN) 10 MG tablet TAKE 1 TABLET(10 MG) BY MOUTH DAILY  ? cyclobenzaprine (FLEXERIL) 10 MG tablet Take 10 mg by mouth at bedtime.   ? glimepiride (AMARYL) 2 MG tablet TAKE 1 TABLET(2 MG) BY MOUTH DAILY  ? ondansetron (ZOFRAN ODT) 4 MG disintegrating tablet Take 1 tablet (4 mg total) by mouth every 8 (eight) hours as needed for nausea or vomiting.  ? predniSONE (DELTASONE) 20 MG tablet Take 2 pills for 3 days, 1 pill for 4 days. Only pick up and take if a1c is under 6.5  ? rosuvastatin (CRESTOR) 40 MG tablet TAKE 1 TABLET(40 MG) BY MOUTH DAILY  ? triamcinolone cream (KENALOG) 0.1 % Apply 1 application topically 2 (two) times daily. For 10 days maximum and then take at least 10 day break  ? ?No facility-administered encounter medications on file as of 09/01/2021.  ? ? ?Allergies (verified) ?Oxycodone-acetaminophen  ? ?History: ?Past Medical History:  ?Diagnosis Date  ? Allergy   ? Diabetes mellitus   ? ORAL MEDS-NO INSULIN  ? [place]  ? 03/2011  ? GERD (gastroesophageal reflux disease)   ? Hiatal hernia   ? History of cervical dysplasia   ? Hyperlipidemia   ?  Malignant neoplasm of body of uterus ([place]) 03/21/2011  ? S.p hysterectomy  ? Ovarian cancer ([place]) 03/2011  ? 03/2011  ? PONV (postoperative nausea and vomiting)   ? ONLY AFTER CERVICAL FUSION--NO PROB WITH OTHER SURGERIES  ? Renal disorder   ? Ulcer   ? history of gastritis  ? ?Past Surgical History:  ?Procedure Laterality Date  ? ACHILLES TENDON SURGERY    ? MULTIPLE SURGERIES BECAUSE OF STAPH INFECTION  ? CERVICAL FUSION  09/2003  ? GYNECOLOGIC CRYOSURGERY  02/2003  ? LUMBAR FUSION  11/1998  ? LYMPH NODE DISSECTION  2012  ? TOTAL VAGINAL HYSTERECTOMY  03/29/2011  ? ?Family History  ?Problem  Relation Age of Onset  ? Diabetes Father   ? Heart disease Father   ? Prostate cancer Father   ? Lymphoma Father   ? Lung cancer Mother   ?     smoker  ? Ovarian cancer Cousin   ? Uterine cancer Cousin   ? Breast cancer Maternal Aunt   ? Colon cancer Paternal Grandmother 40  ? Hemochromatosis Maternal Grandmother   ? Diabetes Paternal Grandfather   ? Lung cancer Sister   ? Healthy Brother   ? ?Social History  ? ?Socioeconomic History  ? Marital status: Married  ?  Spouse name: Not on file  ? Number of children: 1  ? Years of education: Not on file  ? Highest education level: Not on file  ?Occupational History  ?  Comment: receptionist  ?Tobacco Use  ? Smoking status: Never  ? Smokeless tobacco: Never  ?Vaping Use  ? Vaping Use: Never used  ?Substance and Sexual Activity  ? Alcohol use: Not Currently  ?  Comment: once every 2-3 months  ? Drug use: Never  ? Sexual activity: Yes  ?Other Topics Concern  ? Not on file  ?Social History Narrative  ? Married. 2 children (biological son 40, 1 step son 60 in 2017). 4 grandkids.   ?   ? Working part time at [place] in [employer]   ? 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   ? Retiring age 3.   ? 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   ?   ? Hobbies: roller skating- works at [employer] too! Also walks regularly. Thinking about doing bicycling on trainer.   ? ?Social Determinants of Health  ? ?Financial Resource Strain: Low Risk   ? Difficulty of Paying Living Expenses: Not hard at all  ?Food Insecurity: No Food Insecurity  ? Worried About Charity fundraiser in the Last Year: Never true  ? Ran Out of Food in the Last Year: Never true  ?Transportation Needs: No Transportation Needs  ? Lack of Transportation (Medical): No  ? Lack of Transportation (Non-Medical): No  ?Physical Activity: Inactive  ? Days of Exercise per Week: 0 days  ? Minutes of Exercise per Session: 0 min  ?Stress: No  Stress Concern Present  ? Feeling of Stress : Not at all  ?Social Connections: Moderately Isolated  ? Frequency of Communication with Friends and Family: More than three times a week  ? Frequency of Social Gatherings with Friends and Family: More than three times a week  ? Attends Religious Services: Never  ? Active Member of Clubs or Organizations: No  ? Attends Archivist Meetings: Never  ? Marital Status: Married  ? ? ?Tobacco Counseling ?Counseling given: Not Answered ? ? ?  Clinical Intake: ? ?Pre-visit preparation completed: Yes ? ?Pain : No/denies pain ? ?  ? ?BMI - recorded: 25.61 ?Nutritional Status: BMI 25 -29 Overweight ?Nutritional Risks: None ?Diabetes: Yes ?CBG done?: Yes (171 pt stated before meal) ?CBG resulted in Enter/ Edit results?: No ?Did pt. bring in CBG monitor from home?: No ? ?How often do you need to have someone help you when you read instructions, pamphlets, or other written materials from your doctor or pharmacy?: 1 - NeverNutrition Risk Assessment: ? ?Has the patient had any N/V/D within the last 2 months?  No at times nausea  ?Does the patient have any non-healing wounds?  No  ?Has the patient had any unintentional weight loss or weight gain?  No  ? ?Diabetes: ? ?Is the patient diabetic?  Yes  ?If diabetic, was a CBG obtained today?  Yes  ?Did the patient bring in their glucometer from home?  No  ?How often do you monitor your CBG's? Daily.  ? ?Financial Strains and Diabetes Management: ? ?Are you having any financial strains with the device, your supplies or your medication? No .  ?Does the patient want to be seen by Chronic Care Management for management of their diabetes?  No  ?Would the patient like to be referred to a Nutritionist or for Diabetic Management?  No  ? ?Diabetic Exams: ? ?Diabetic Eye Exam: Completed 04/20/21 ?Diabetic Foot Exam: Completed 10/11/20 ? ? ? ? ?Interpreter Needed?: No ? ?Information entered by :: Charlott Rakes, LPN ? ? ?Activities of Daily  Living ? ?  09/01/2021  ?  2:40 PM 02/10/2021  ?  3:27 PM  ?In your present state of health, do you have any difficulty performing the following activities:  ?Hearing? 1 1  ?Vision? 0 0  ?Difficulty concentrating or makin

## 2021-10-14 IMAGING — CT CT HEAD W/O CM
3 series · 14 of 47 positions shown, 16 images · non-contrast
Comparison: None.

CLINICAL DATA: Severe headache for 3 days. Hearing loss in the
right ear.

EXAM:
CT HEAD WITHOUT CONTRAST
TECHNIQUE: Contiguous axial images were obtained from the base of the skull
through the vertex without intravenous contrast.

[Series 3: head 5.0 h30s · axial · 0.44mm/px · z∈[-101,+24]mm · 8 of 30 slices shown, 10 images]
[im 3/30  brain]
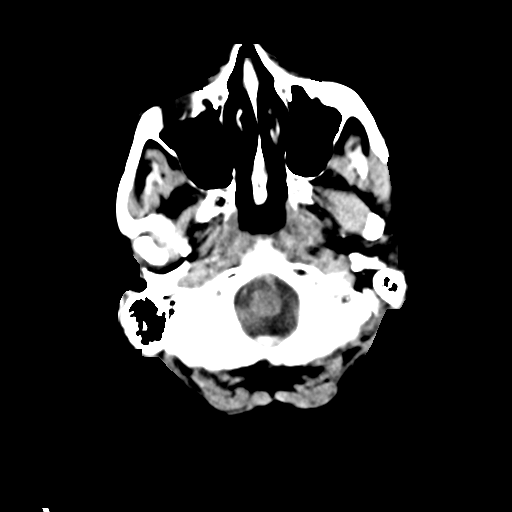
[im 3/30  bone]
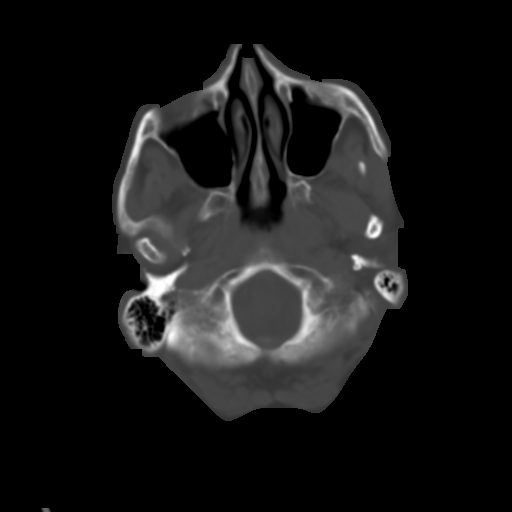
[im 7/30  brain]
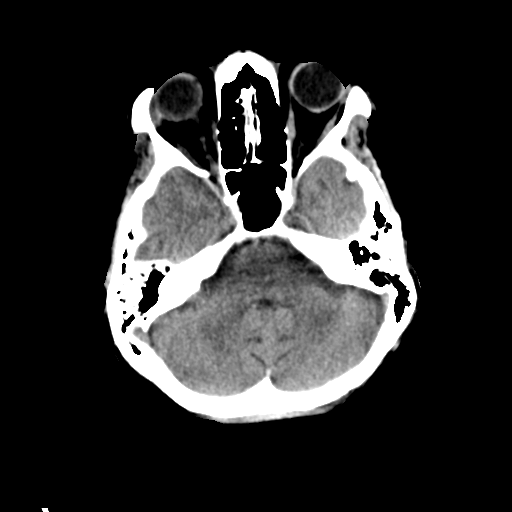
[im 10/30  brain]
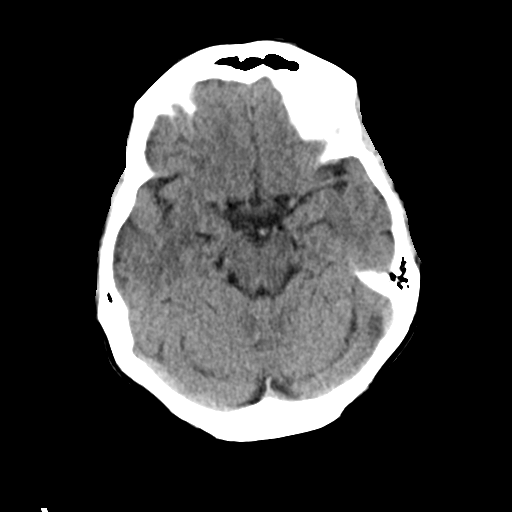
[im 14/30  brain]
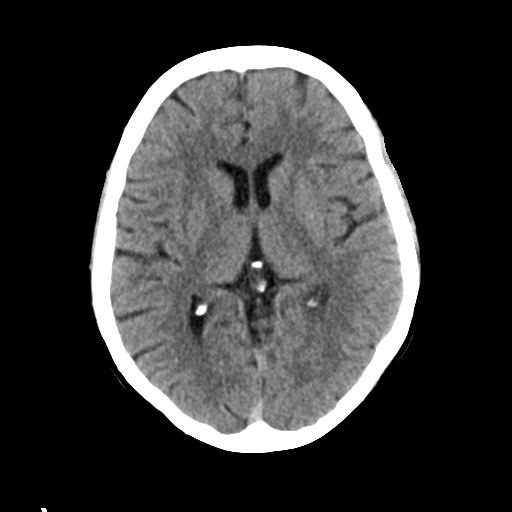
[im 17/30  brain]
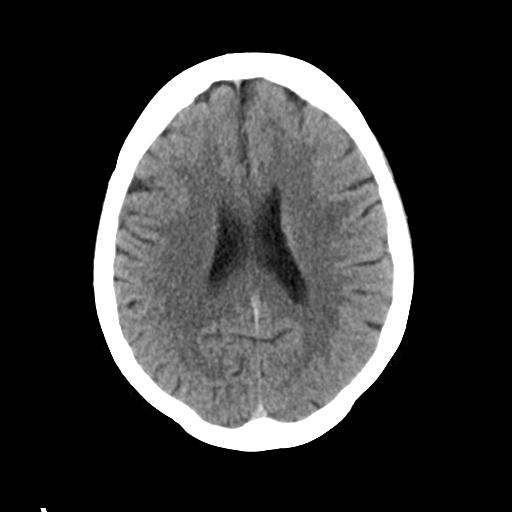
[im 17/30  bone]
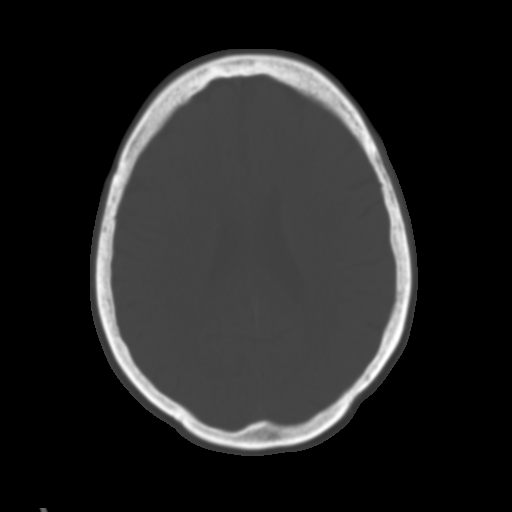
[im 21/30  brain]
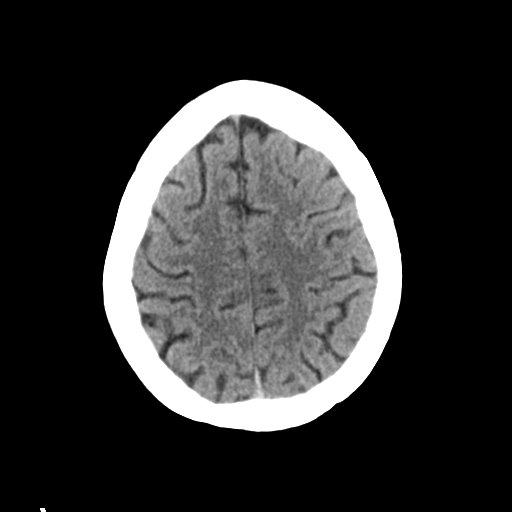
[im 24/30  brain]
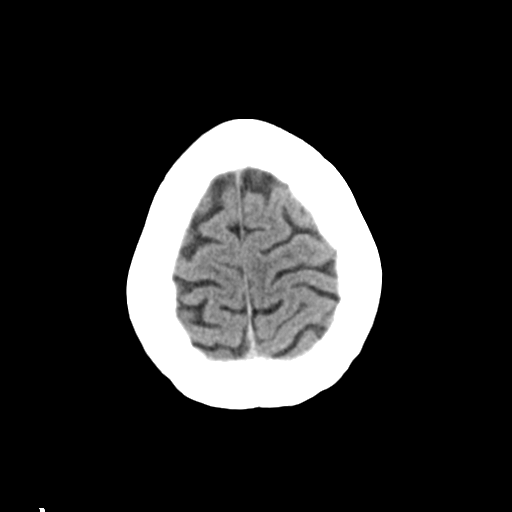
[im 28/30  brain]
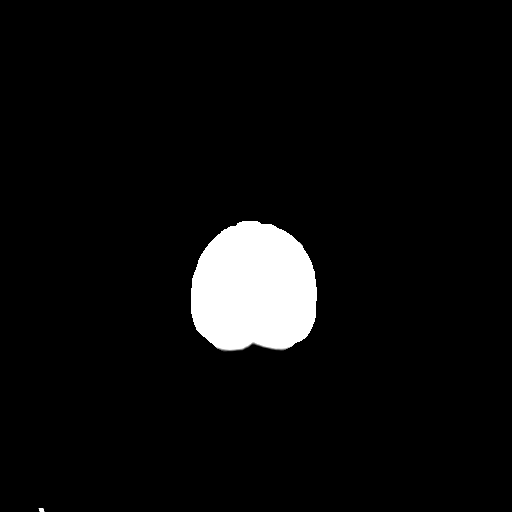

[Series 5: head 3.0 mpr cor · coronal · 0.29mm/px · 3 of 67 slices shown]
[im 23/67  brain]
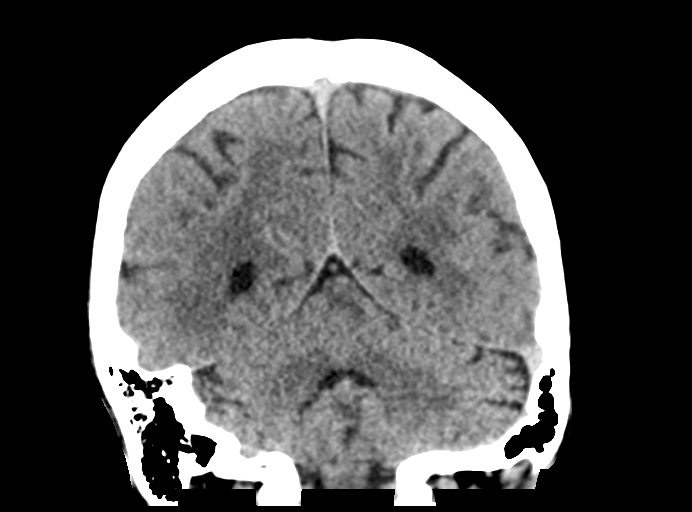
[im 30/67  brain]
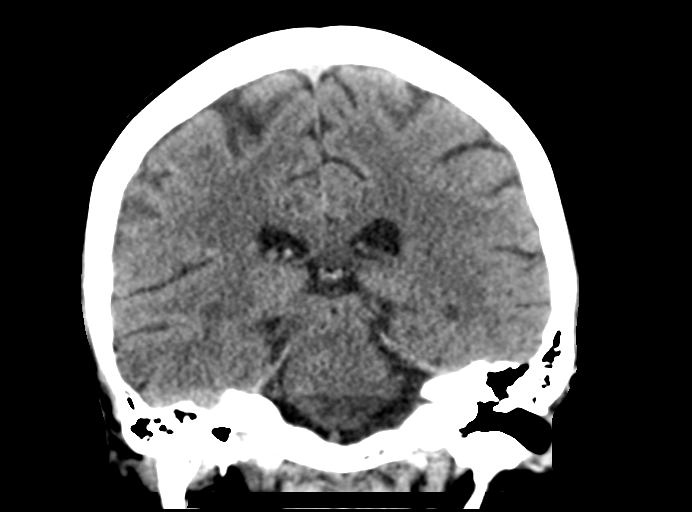
[im 37/67  brain]
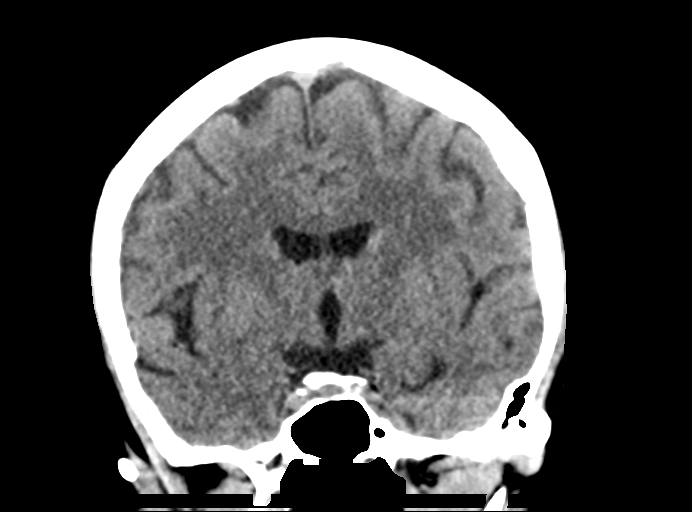

[Series 6: head 3.0 mpr sag · sagittal · 0.29mm/px · 3 of 67 slices shown]
[im 23/67  brain]
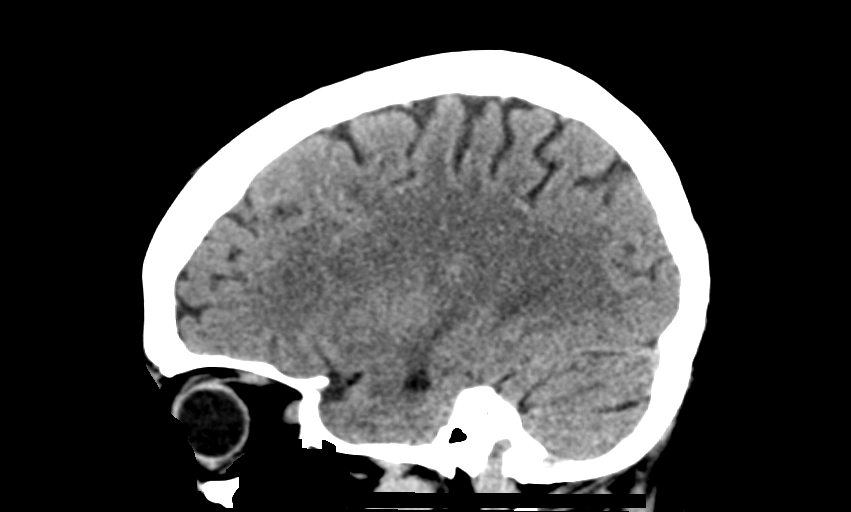
[im 34/67  brain]
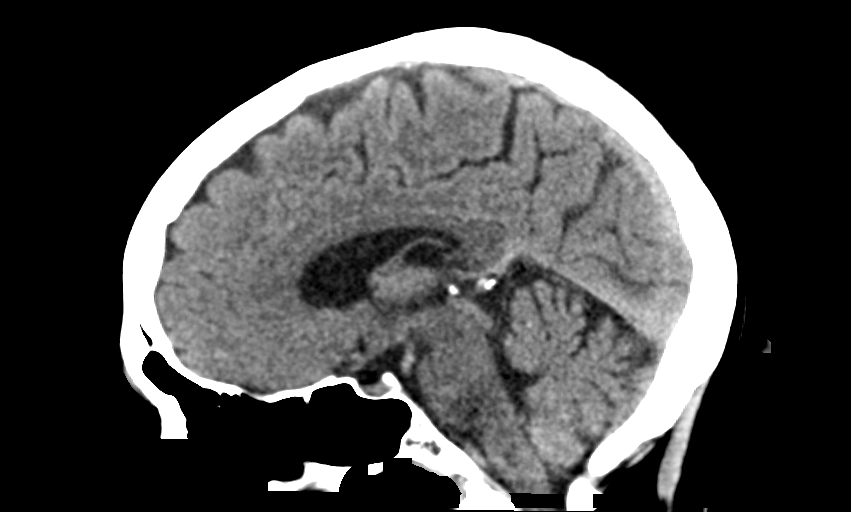
[im 45/67  brain]
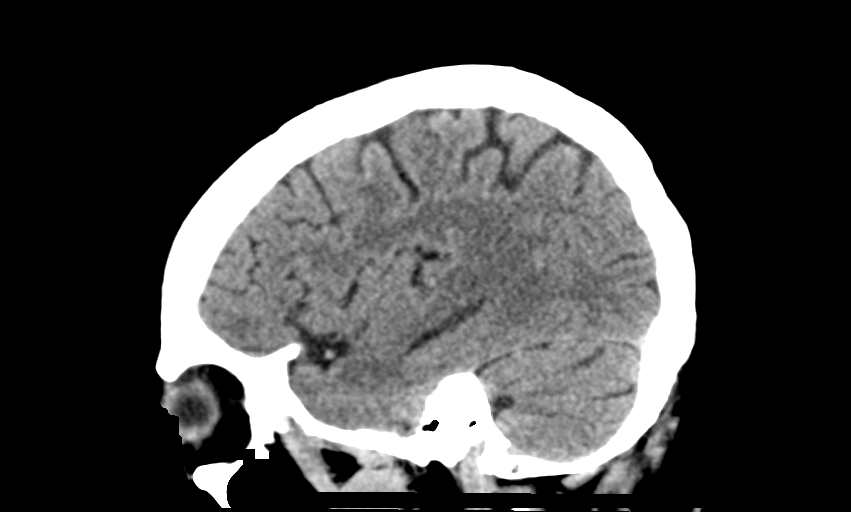

[14 of 47 positions shown; findings below may reference images not displayed]

FINDINGS: Brain: No evidence of acute infarction, hemorrhage, hydrocephalus,
extra-axial collection or mass lesion/mass effect.

Vascular: There are vascular calcifications in the carotid siphons.

Skull: Normal. Negative for fracture or focal lesion.

Sinuses/Orbits: No acute finding.

Other: None.
IMPRESSION: No acute intracranial process.

## 2021-10-31 ENCOUNTER — Other Ambulatory Visit: Payer: Self-pay | Admitting: Family Medicine

## 2021-12-07 ENCOUNTER — Other Ambulatory Visit: Payer: Self-pay | Admitting: Family Medicine

## 2021-12-07 DIAGNOSIS — M7552 Bursitis of left shoulder: Secondary | ICD-10-CM | POA: Diagnosis not present

## 2021-12-07 DIAGNOSIS — M4322 Fusion of spine, cervical region: Secondary | ICD-10-CM | POA: Diagnosis not present

## 2021-12-07 DIAGNOSIS — M791 Myalgia, unspecified site: Secondary | ICD-10-CM | POA: Diagnosis not present

## 2021-12-07 DIAGNOSIS — M542 Cervicalgia: Secondary | ICD-10-CM | POA: Diagnosis not present

## 2021-12-07 DIAGNOSIS — Z6823 Body mass index (BMI) 23.0-23.9, adult: Secondary | ICD-10-CM | POA: Diagnosis not present

## 2021-12-16 ENCOUNTER — Other Ambulatory Visit: Payer: Self-pay | Admitting: Family Medicine

## 2021-12-17 ENCOUNTER — Encounter: Payer: Self-pay | Admitting: Family Medicine

## 2021-12-28 DIAGNOSIS — H35372 Puckering of macula, left eye: Secondary | ICD-10-CM | POA: Diagnosis not present

## 2021-12-28 DIAGNOSIS — H40013 Open angle with borderline findings, low risk, bilateral: Secondary | ICD-10-CM | POA: Diagnosis not present

## 2021-12-28 DIAGNOSIS — H35342 Macular cyst, hole, or pseudohole, left eye: Secondary | ICD-10-CM | POA: Diagnosis not present

## 2021-12-29 ENCOUNTER — Ambulatory Visit (INDEPENDENT_AMBULATORY_CARE_PROVIDER_SITE_OTHER): Payer: Medicare Other | Admitting: Family Medicine

## 2021-12-29 ENCOUNTER — Encounter: Payer: Self-pay | Admitting: Family Medicine

## 2021-12-29 VITALS — BP 122/70 | HR 68 | Temp 98.1°F | Ht 62.0 in | Wt 145.2 lb

## 2021-12-29 DIAGNOSIS — E119 Type 2 diabetes mellitus without complications: Secondary | ICD-10-CM | POA: Diagnosis not present

## 2021-12-29 DIAGNOSIS — I10 Essential (primary) hypertension: Secondary | ICD-10-CM | POA: Diagnosis not present

## 2021-12-29 DIAGNOSIS — B353 Tinea pedis: Secondary | ICD-10-CM | POA: Diagnosis not present

## 2021-12-29 MED ORDER — ONDANSETRON 4 MG PO TBDP
4.0000 mg | ORAL_TABLET | Freq: Three times a day (TID) | ORAL | 0 refills | Status: DC | PRN
Start: 2021-12-29 — End: 2022-07-02

## 2021-12-29 MED ORDER — ALPRAZOLAM 0.5 MG PO TABS
0.5000 mg | ORAL_TABLET | Freq: Every evening | ORAL | 2 refills | Status: DC | PRN
Start: 2021-12-29 — End: 2022-07-02

## 2021-12-29 NOTE — Addendum Note (Signed)
Addended by: Clyde Lundborg A on: 12/29/2021 03:18 PM   Modules accepted: Orders

## 2021-12-29 NOTE — Progress Notes (Signed)
Phone [phone] In person visit   Subjective:   Jamie [name] is a 66 y.o. year old very pleasant female patient who presents for/with See problem oriented charting Chief Complaint  Patient presents with   right foot swelling    Pt states the bottom of her right foot is red and swollen since Sunday.   Past Medical History-  Patient Active Problem List   Diagnosis Date Noted   History of sepsis 11/01/2019    Priority: High   History of ovarian cancer 03/27/2018    Priority: High   Diabetes mellitus, type 2 ([place]) 04/25/2007    Priority: High   CKD stage 3 due to type 2 diabetes mellitus ([place]) 10/12/2020    Priority: Medium    Lymphedema of leg 08/27/2011    Priority: Medium    Hyperlipidemia associated with type 2 diabetes mellitus ([place]) 01/27/2007    Priority: Medium    Hypertension associated with diabetes ([place]) 01/27/2007    Priority: Medium    Fibromyalgia 01/27/2007    Priority: Medium    Bunion of great toe 09/08/2019    Priority: Low   Rash 10/28/2018    Priority: Low   Abdominal pain 05/22/2012    Priority: Low   Nonspecific elevation of levels of transaminase or lactic acid dehydrogenase (LDH) 08/03/2010    Priority: Low   Allergic rhinitis 01/27/2007    Priority: Low   GERD 01/27/2007    Priority: Low   MENOPAUSE-RELATED VASOMOTOR SYMPTOMS, HOT FLASHES 01/27/2007    Priority: Low   Atypical chest pain 01/27/2007    Priority: Low   Osteopenia 10/31/2020   Senile purpura ([place]) 01/14/2020   AKI (acute kidney injury) ([place])    History of endometrial cancer 05/12/2019    Medications- reviewed and updated Current Outpatient Medications  Medication Sig Dispense Refill   benazepril (LOTENSIN) 10 MG tablet TAKE 1 TABLET(10 MG) BY MOUTH DAILY 90 tablet 0   cyclobenzaprine (FLEXERIL) 10 MG tablet Take 10 mg by mouth at bedtime.      glimepiride (AMARYL) 2 MG tablet TAKE 1 TABLET(2 MG) BY MOUTH DAILY 90 tablet 3   rosuvastatin (CRESTOR) 40 MG tablet TAKE  1 TABLET(40 MG) BY MOUTH DAILY 90 tablet 3   triamcinolone cream (KENALOG) 0.1 % APPLY TOPICALLY TO THE AFFECTED AREA TWICE DAILY FOR 10 DAYS. MAXIMUM AND THEN TAKE AT LEAST 10 DAY BREAK 30 g 3   ALPRAZolam (XANAX) 0.5 MG tablet Take 1 tablet (0.5 mg total) by mouth at bedtime as needed for anxiety or sleep. 30 tablet 2   ondansetron (ZOFRAN ODT) 4 MG disintegrating tablet Take 1 tablet (4 mg total) by mouth every 8 (eight) hours as needed for nausea or vomiting. 20 tablet 0   No current facility-administered medications for this visit.     Objective:  BP 122/70   Pulse 68   Temp 98.1 F (36.7 C)   Ht '5\' 2"'$  (1.575 m)   Wt 145 lb 3.2 oz (65.9 kg)   LMP  (LMP Unknown)   SpO2 98%   BMI 26.56 kg/m  Gen: NAD, resting comfortably CV: RRR no murmurs rubs or gallops Lungs: CTAB no crackles, wheeze, rhonchi Ext: no edema Skin: right foot with scaling on bottom and moccasin distribution for redness with slightly raised border    Assessment and Plan   #social update- sister fighting cancer- now lung cancer on left lung  # Right foot swelling S:states bottom of right foot is red and swollen- started on  sunday - went through the entire bottom of the foot including across the heel- has become less intense since that time but still red almost like wearing a very low sock. Sometimes gets more intense- when it got really red had splotches on legs. Concern due to sepsis history. Gets a burning sensation with it.  -reflecting back has had similar issues for 2 years and intermittent lamisil has helped A/P: This looks like moccasin type tinea pedis-basically athlete's foot - need to use lamisil twice a day consistently for probably at least 2 weeks- try to use a couple days after it looks like completely gone.  - if worsens instead of improving please let us know but this certainly doesn't look like cellulitis and I dont see evidence of gout   #Vision issues- has evaluation for retinal hole coming up  with retinal specialist  # Diabetes S: Medication: Amaryl 2 mg - nausea on metformin even lower doses CBGs-  sugars have spiked after shoulder injection 2 weeks ago as high as 330 but today back to 152. Plus a lot of stress -gained weight this summer and worried sugars higher- has started back at work and more active and less likely to eat out Lab Results  Component Value Date   HGBA1C 6.3 (H) 08/25/2021  A/P: if a1c is elevated but not over 8.0- she wants to work on lifestyle instead of adding medicine. Can take an extra glimepiride if needed  - worried about nausea on ozempic -factor in recent steroid shot  #hypertension S: medication: benazepril 10 mg A/P: Controlled. Continue current medications.   # Anxiety S:Medication: alprazolam 0.5 mg (do not drive 8 hours after taking)- uses as needed for anxiety or sleep- last fill 02/10/21   A/P: reasonable control - refill medication    Recommended follow up: Return in about 6 months (around 07/01/2022) for physical or sooner if needed.Schedule b4 you leave. Future Appointments  Date Time Provider [place]  09/14/2022  2:30 PM [facility] COACH [facility]   Lab/Order associations:   ICD-10-CM   1. Type 2 diabetes mellitus without complication, without long-term current use of insulin (HCC)  E11.9 Comprehensive metabolic panel    Hemoglobin A1c      Meds ordered this encounter  Medications   ALPRAZolam (XANAX) 0.5 MG tablet    Sig: Take 1 tablet (0.5 mg total) by mouth at bedtime as needed for anxiety or sleep.    Dispense:  30 tablet    Refill:  2   ondansetron (ZOFRAN ODT) 4 MG disintegrating tablet    Sig: Take 1 tablet (4 mg total) by mouth every 8 (eight) hours as needed for nausea or vomiting.    Dispense:  20 tablet    Refill:  0    Return precautions advised.  Garret Reddish, MD

## 2021-12-29 NOTE — Patient Instructions (Addendum)
Flu shot- we should have these available within a month or two but please let us know if you get at outside pharmacy (high dose)  Team please get date of shingrix shot and input from [facility]  This looks like moccasin type tinea pedis-basically athlete's foot - need to use lamisil twice a day consistently for probably at least 2 weeks- try to use a couple days after it looks like completely gone.  - if worsens instead of improving please let us know but this certainly doesn't look like cellulitis and I dont see evidence of gout  Please stop by lab before you go If you have mychart- we will send your results within 3 business days of [place] receiving them.  If you do not have mychart- we will call you about results within 5 business days of [place] receiving them.  *please also note that you will see labs on mychart as soon as they post. I will later go in and write notes on them- will say "notes from Dr. Yong Channel"   Recommended follow up: Return in about 6 months (around 07/01/2022) for physical or sooner if needed.Schedule b4 you leave.

## 2021-12-30 LAB — HEMOGLOBIN A1C
Hgb A1c MFr Bld: 8.4 % of total Hgb — ABNORMAL HIGH (ref <5.7)
Mean Plasma Glucose: 194 mg/dL
eAG (mmol/L): 10.8 mmol/L

## 2021-12-30 LAB — COMPREHENSIVE METABOLIC PANEL
AG Ratio: 2.3 (calc) (ref 1.0–2.5)
ALT: 32 U/L — ABNORMAL HIGH (ref 6–29)
AST: 19 U/L (ref 10–35)
Albumin: 4.4 g/dL (ref 3.6–5.1)
Alkaline phosphatase (APISO): 68 U/L (ref 37–153)
BUN/Creatinine Ratio: 16 (calc) (ref 6–22)
BUN: 20 mg/dL (ref 7–25)
CO2: 22 mmol/L (ref 20–32)
Calcium: 9.8 mg/dL (ref 8.6–10.4)
Chloride: 104 mmol/L (ref 98–110)
Creat: 1.24 mg/dL — ABNORMAL HIGH (ref 0.50–1.05)
Globulin: 1.9 g/dL (calc) (ref 1.9–3.7)
Glucose, Bld: 123 mg/dL — ABNORMAL HIGH (ref 65–99)
Potassium: 5 mmol/L (ref 3.5–5.3)
Sodium: 139 mmol/L (ref 135–146)
Total Bilirubin: 0.6 mg/dL (ref 0.2–1.2)
Total Protein: 6.3 g/dL (ref 6.1–8.1)

## 2022-01-04 DIAGNOSIS — H35342 Macular cyst, hole, or pseudohole, left eye: Secondary | ICD-10-CM | POA: Diagnosis not present

## 2022-01-04 DIAGNOSIS — H2513 Age-related nuclear cataract, bilateral: Secondary | ICD-10-CM | POA: Diagnosis not present

## 2022-01-04 DIAGNOSIS — E119 Type 2 diabetes mellitus without complications: Secondary | ICD-10-CM | POA: Diagnosis not present

## 2022-01-04 DIAGNOSIS — H35033 Hypertensive retinopathy, bilateral: Secondary | ICD-10-CM | POA: Diagnosis not present

## 2022-01-11 ENCOUNTER — Other Ambulatory Visit: Payer: Self-pay | Admitting: Family Medicine

## 2022-01-19 DIAGNOSIS — H35342 Macular cyst, hole, or pseudohole, left eye: Secondary | ICD-10-CM | POA: Diagnosis not present

## 2022-01-26 DIAGNOSIS — H35342 Macular cyst, hole, or pseudohole, left eye: Secondary | ICD-10-CM | POA: Diagnosis not present

## 2022-02-12 ENCOUNTER — Other Ambulatory Visit: Payer: Self-pay | Admitting: Family Medicine

## 2022-02-23 DIAGNOSIS — H35342 Macular cyst, hole, or pseudohole, left eye: Secondary | ICD-10-CM | POA: Diagnosis not present

## 2022-03-21 ENCOUNTER — Other Ambulatory Visit: Payer: Self-pay | Admitting: Orthopedic Surgery

## 2022-03-21 DIAGNOSIS — G5603 Carpal tunnel syndrome, bilateral upper limbs: Secondary | ICD-10-CM | POA: Diagnosis not present

## 2022-04-03 ENCOUNTER — Other Ambulatory Visit: Payer: Self-pay | Admitting: Orthopedic Surgery

## 2022-04-23 DIAGNOSIS — E119 Type 2 diabetes mellitus without complications: Secondary | ICD-10-CM | POA: Diagnosis not present

## 2022-04-23 DIAGNOSIS — H35372 Puckering of macula, left eye: Secondary | ICD-10-CM | POA: Diagnosis not present

## 2022-04-23 DIAGNOSIS — H52203 Unspecified astigmatism, bilateral: Secondary | ICD-10-CM | POA: Diagnosis not present

## 2022-04-23 LAB — HM DIABETES EYE EXAM

## 2022-04-24 ENCOUNTER — Other Ambulatory Visit: Payer: Self-pay

## 2022-04-24 ENCOUNTER — Encounter (HOSPITAL_BASED_OUTPATIENT_CLINIC_OR_DEPARTMENT_OTHER): Payer: Self-pay | Admitting: Orthopedic Surgery

## 2022-04-30 ENCOUNTER — Other Ambulatory Visit: Payer: Self-pay | Admitting: Family Medicine

## 2022-04-30 NOTE — Anesthesia Preprocedure Evaluation (Addendum)
Anesthesia Evaluation  Patient identified by MRN, date of birth, ID band Patient awake    Reviewed: Allergy & Precautions, NPO status , Patient's Chart, lab work & pertinent test results  History of Anesthesia Complications (+) PONV and history of anesthetic complications  Airway Mallampati: III  TM Distance: >3 FB Neck ROM: Full    Dental  (+) Poor Dentition, Dental Advisory Given   Pulmonary neg pulmonary ROS   Pulmonary exam normal breath sounds clear to auscultation       Cardiovascular hypertension (benazepril), Pt. on medications (-) angina (-) Past MI, (-) Cardiac Stents and (-) CABG (-) dysrhythmias  Rhythm:Regular Rate:Normal  HLD  CT Coronary 10/28/2019: IMPRESSION: 1. Normal aortic root 3.0 cm   2.  Calcium score 31 which is 71% percentile for age and sex   22.  CAD RAD 1 non obstructive CAD see description above    Neuro/Psych  PSYCHIATRIC DISORDERS Anxiety     S/p cervical and lumbar fusion negative neurological ROS     GI/Hepatic Neg liver ROS, hiatal hernia,GERD  ,,  Endo/Other  diabetes (Hgb A1c 8.4), Poorly Controlled, Type 2, Oral Hypoglycemic Agents    Renal/GU CRFRenal disease     Musculoskeletal  (+)  Fibromyalgia -  Abdominal   Peds  Hematology negative hematology ROS (+)   Anesthesia Other Findings H/o endometrial and ovarian cancer 2012 s/p hysterectomy  Reproductive/Obstetrics                             Anesthesia Physical Anesthesia Plan  ASA: 3  Anesthesia Plan: Bier Block and MAC and Bier Block-Lidocaine Only   Post-op Pain Management:    Induction:   PONV Risk Score and Plan: 3 and Ondansetron and Propofol infusion  Airway Management Planned: Natural Airway and Simple Face Mask  Additional Equipment:   Intra-op Plan:   Post-operative Plan:   Informed Consent: I have reviewed the patients History and Physical, chart, labs and discussed the  procedure including the risks, benefits and alternatives for the proposed anesthesia with the patient or authorized representative who has indicated his/her understanding and acceptance.     Dental advisory given  Plan Discussed with: CRNA and Anesthesiologist  Anesthesia Plan Comments: (Discussed with patient risks of MAC including, but not limited to, minor pain or discomfort, hearing people in the room, and possible need for backup general anesthesia. Risks for general anesthesia also discussed including, but not limited to, sore throat, hoarse voice, chipped/damaged teeth, injury to vocal cords, nausea and vomiting, allergic reactions, lung infection, heart attack, stroke, and death. All questions answered. )        Anesthesia Quick Evaluation

## 2022-05-01 ENCOUNTER — Encounter (HOSPITAL_BASED_OUTPATIENT_CLINIC_OR_DEPARTMENT_OTHER)
Admission: RE | Admit: 2022-05-01 | Discharge: 2022-05-01 | Disposition: A | Discharge status: Home / Self Care | Payer: Medicare Other | Source: Ambulatory Visit | Attending: Orthopedic Surgery | Admitting: Orthopedic Surgery

## 2022-05-01 DIAGNOSIS — Z01818 Encounter for other preprocedural examination: Secondary | ICD-10-CM | POA: Diagnosis not present

## 2022-05-01 DIAGNOSIS — E119 Type 2 diabetes mellitus without complications: Secondary | ICD-10-CM | POA: Insufficient documentation

## 2022-05-01 DIAGNOSIS — I10 Essential (primary) hypertension: Secondary | ICD-10-CM | POA: Diagnosis not present

## 2022-05-01 LAB — BASIC METABOLIC PANEL
Anion gap: 6 (ref 5–15)
BUN: 12 mg/dL (ref 8–23)
CO2: 27 mmol/L (ref 22–32)
Calcium: 9.3 mg/dL (ref 8.9–10.3)
Chloride: 108 mmol/L (ref 98–111)
Creatinine, Ser: 1.06 mg/dL — ABNORMAL HIGH (ref 0.44–1.00)
GFR, Estimated: 58 mL/min — ABNORMAL LOW (ref >60)
Glucose, Bld: 184 mg/dL — ABNORMAL HIGH (ref 70–99)
Potassium: 4.9 mmol/L (ref 3.5–5.1)
Sodium: 141 mmol/L (ref 135–145)

## 2022-05-01 NOTE — Progress Notes (Signed)

## 2022-05-03 ENCOUNTER — Encounter (HOSPITAL_BASED_OUTPATIENT_CLINIC_OR_DEPARTMENT_OTHER): Payer: Self-pay | Admitting: Orthopedic Surgery

## 2022-05-03 ENCOUNTER — Other Ambulatory Visit: Payer: Self-pay

## 2022-05-03 ENCOUNTER — Ambulatory Visit (HOSPITAL_BASED_OUTPATIENT_CLINIC_OR_DEPARTMENT_OTHER)
Admission: RE | Admit: 2022-05-03 | Discharge: 2022-05-03 | Disposition: A | Discharge status: Home / Self Care | Payer: Medicare Other | Attending: Orthopedic Surgery | Admitting: Orthopedic Surgery

## 2022-05-03 ENCOUNTER — Encounter (HOSPITAL_BASED_OUTPATIENT_CLINIC_OR_DEPARTMENT_OTHER)
Admission: RE | Disposition: A | Discharge status: Home / Self Care | Payer: Self-pay | Source: Home / Self Care | Attending: Orthopedic Surgery

## 2022-05-03 ENCOUNTER — Ambulatory Visit (HOSPITAL_BASED_OUTPATIENT_CLINIC_OR_DEPARTMENT_OTHER): Payer: Medicare Other | Admitting: Anesthesiology

## 2022-05-03 DIAGNOSIS — G5603 Carpal tunnel syndrome, bilateral upper limbs: Secondary | ICD-10-CM

## 2022-05-03 DIAGNOSIS — Z8543 Personal history of malignant neoplasm of ovary: Secondary | ICD-10-CM | POA: Diagnosis not present

## 2022-05-03 DIAGNOSIS — Z7984 Long term (current) use of oral hypoglycemic drugs: Secondary | ICD-10-CM | POA: Diagnosis not present

## 2022-05-03 DIAGNOSIS — F419 Anxiety disorder, unspecified: Secondary | ICD-10-CM | POA: Diagnosis not present

## 2022-05-03 DIAGNOSIS — E1122 Type 2 diabetes mellitus with diabetic chronic kidney disease: Secondary | ICD-10-CM | POA: Insufficient documentation

## 2022-05-03 DIAGNOSIS — Z8542 Personal history of malignant neoplasm of other parts of uterus: Secondary | ICD-10-CM | POA: Insufficient documentation

## 2022-05-03 DIAGNOSIS — G5601 Carpal tunnel syndrome, right upper limb: Secondary | ICD-10-CM | POA: Diagnosis not present

## 2022-05-03 DIAGNOSIS — Z981 Arthrodesis status: Secondary | ICD-10-CM | POA: Diagnosis not present

## 2022-05-03 DIAGNOSIS — N189 Chronic kidney disease, unspecified: Secondary | ICD-10-CM | POA: Insufficient documentation

## 2022-05-03 DIAGNOSIS — I10 Essential (primary) hypertension: Secondary | ICD-10-CM | POA: Diagnosis not present

## 2022-05-03 DIAGNOSIS — Z90710 Acquired absence of both cervix and uterus: Secondary | ICD-10-CM | POA: Insufficient documentation

## 2022-05-03 DIAGNOSIS — I129 Hypertensive chronic kidney disease with stage 1 through stage 4 chronic kidney disease, or unspecified chronic kidney disease: Secondary | ICD-10-CM | POA: Insufficient documentation

## 2022-05-03 DIAGNOSIS — Z79899 Other long term (current) drug therapy: Secondary | ICD-10-CM | POA: Insufficient documentation

## 2022-05-03 DIAGNOSIS — E1165 Type 2 diabetes mellitus with hyperglycemia: Secondary | ICD-10-CM | POA: Diagnosis not present

## 2022-05-03 DIAGNOSIS — E785 Hyperlipidemia, unspecified: Secondary | ICD-10-CM

## 2022-05-03 DIAGNOSIS — E119 Type 2 diabetes mellitus without complications: Secondary | ICD-10-CM

## 2022-05-03 DIAGNOSIS — G5602 Carpal tunnel syndrome, left upper limb: Secondary | ICD-10-CM | POA: Diagnosis not present

## 2022-05-03 HISTORY — PX: CARPAL TUNNEL RELEASE: SHX101

## 2022-05-03 HISTORY — PX: STERIOD INJECTION: SHX5046

## 2022-05-03 LAB — GLUCOSE, CAPILLARY: Glucose-Capillary: 169 mg/dL — ABNORMAL HIGH (ref 70–99)

## 2022-05-03 SURGERY — CARPAL TUNNEL RELEASE
Anesthesia: Monitor Anesthesia Care | Site: Hand | Laterality: Right

## 2022-05-03 MED ORDER — LIDOCAINE HCL (PF) 1 % IJ SOLN
INTRAMUSCULAR | Status: DC | PRN
  Administered 2022-05-03: 1 mL

## 2022-05-03 MED ORDER — BUPIVACAINE HCL (PF) 0.25 % IJ SOLN
INTRAMUSCULAR | Status: DC | PRN
  Administered 2022-05-03: 9 mL

## 2022-05-03 MED ORDER — FENTANYL CITRATE (PF) 100 MCG/2ML IJ SOLN
INTRAMUSCULAR | Status: DC | PRN
  Administered 2022-05-03: 100 ug via INTRAVENOUS

## 2022-05-03 MED ORDER — LIDOCAINE HCL (PF) 0.5 % IJ SOLN
INTRAMUSCULAR | Status: DC | PRN
  Administered 2022-05-03: 30 mL via INTRAVENOUS

## 2022-05-03 MED ORDER — ONDANSETRON HCL 4 MG/2ML IJ SOLN
INTRAMUSCULAR | Status: AC
  Filled 2022-05-03: qty 2

## 2022-05-03 MED ORDER — AMISULPRIDE (ANTIEMETIC) 5 MG/2ML IV SOLN: 10.0000 mg | Freq: Once | INTRAVENOUS | Status: DC | PRN

## 2022-05-03 MED ORDER — FENTANYL CITRATE (PF) 100 MCG/2ML IJ SOLN
INTRAMUSCULAR | Status: AC
  Filled 2022-05-03: qty 2

## 2022-05-03 MED ORDER — PROPOFOL 500 MG/50ML IV EMUL
INTRAVENOUS | Status: AC
  Filled 2022-05-03: qty 50

## 2022-05-03 MED ORDER — PROPOFOL 500 MG/50ML IV EMUL
INTRAVENOUS | Status: DC | PRN
  Administered 2022-05-03: 50 ug/kg/min via INTRAVENOUS

## 2022-05-03 MED ORDER — FENTANYL CITRATE (PF) 100 MCG/2ML IJ SOLN: 25.0000 ug | INTRAMUSCULAR | Status: DC | PRN

## 2022-05-03 MED ORDER — LIDOCAINE HCL (PF) 1 % IJ SOLN
INTRAMUSCULAR | Status: AC
  Filled 2022-05-03: qty 5

## 2022-05-03 MED ORDER — ACETAMINOPHEN 10 MG/ML IV SOLN: 1000.0000 mg | Freq: Once | INTRAVENOUS | Status: DC | PRN

## 2022-05-03 MED ORDER — CEFAZOLIN SODIUM-DEXTROSE 2-4 GM/100ML-% IV SOLN
INTRAVENOUS | Status: AC
  Filled 2022-05-03: qty 100

## 2022-05-03 MED ORDER — LIDOCAINE 2% (20 MG/ML) 5 ML SYRINGE
INTRAMUSCULAR | Status: AC
  Filled 2022-05-03: qty 5

## 2022-05-03 MED ORDER — CEFAZOLIN SODIUM-DEXTROSE 2-4 GM/100ML-% IV SOLN: 2.0000 g | INTRAVENOUS | Status: DC

## 2022-05-03 MED ORDER — LACTATED RINGERS IV SOLN: INTRAVENOUS | Status: DC

## 2022-05-03 MED ORDER — METHYLPREDNISOLONE ACETATE 40 MG/ML IJ SUSP
INTRAMUSCULAR | Status: DC | PRN
  Administered 2022-05-03: 40 mg

## 2022-05-03 MED ORDER — TRAMADOL HCL 50 MG PO TABS: ORAL_TABLET | ORAL | 0 refills | Status: DC

## 2022-05-03 MED ORDER — MIDAZOLAM HCL 2 MG/2ML IJ SOLN
INTRAMUSCULAR | Status: AC
  Filled 2022-05-03: qty 2

## 2022-05-03 MED ORDER — 0.9 % SODIUM CHLORIDE (POUR BTL) OPTIME
TOPICAL | Status: DC | PRN
  Administered 2022-05-03: 75 mL

## 2022-05-03 MED ORDER — CEFAZOLIN SODIUM-DEXTROSE 2-4 GM/100ML-% IV SOLN
2.0000 g | INTRAVENOUS | Status: AC
  Administered 2022-05-03: 2 g via INTRAVENOUS

## 2022-05-03 MED ORDER — PROPOFOL 10 MG/ML IV BOLUS
INTRAVENOUS | Status: DC | PRN
  Administered 2022-05-03: 30 mg via INTRAVENOUS

## 2022-05-03 MED ORDER — MIDAZOLAM HCL 5 MG/5ML IJ SOLN
INTRAMUSCULAR | Status: DC | PRN
  Administered 2022-05-03: 2 mg via INTRAVENOUS

## 2022-05-03 MED ORDER — ONDANSETRON HCL 4 MG/2ML IJ SOLN
INTRAMUSCULAR | Status: DC | PRN
  Administered 2022-05-03: 4 mg via INTRAVENOUS

## 2022-05-03 SURGICAL SUPPLY — 38 items
APL PRP STRL LF DISP 70% ISPRP (MISCELLANEOUS) ×2
BLADE SURG 15 STRL LF DISP TIS (BLADE) ×4 IMPLANT
BLADE SURG 15 STRL SS (BLADE) ×4
BNDG CMPR 9X4 STRL LF SNTH (GAUZE/BANDAGES/DRESSINGS) ×2
BNDG ELASTIC 3X5.8 VLCR STR LF (GAUZE/BANDAGES/DRESSINGS) ×2 IMPLANT
BNDG ESMARK 4X9 LF (GAUZE/BANDAGES/DRESSINGS) IMPLANT
BNDG GAUZE DERMACEA FLUFF 4 (GAUZE/BANDAGES/DRESSINGS) ×2 IMPLANT
BNDG GZE DERMACEA 4 6PLY (GAUZE/BANDAGES/DRESSINGS) ×2
CHLORAPREP W/TINT 26 (MISCELLANEOUS) ×2 IMPLANT
CORD BIPOLAR FORCEPS 12FT (ELECTRODE) ×2 IMPLANT
COVER BACK TABLE 60X90IN (DRAPES) ×2 IMPLANT
COVER MAYO STAND STRL (DRAPES) ×2 IMPLANT
CUFF TOURN SGL QUICK 18X4 (TOURNIQUET CUFF) ×2 IMPLANT
DRAPE EXTREMITY T 121X128X90 (DISPOSABLE) ×2 IMPLANT
DRAPE SURG 17X23 STRL (DRAPES) ×2 IMPLANT
GAUZE PAD ABD 8X10 STRL (GAUZE/BANDAGES/DRESSINGS) ×2 IMPLANT
GAUZE SPONGE 4X4 12PLY STRL (GAUZE/BANDAGES/DRESSINGS) ×2 IMPLANT
GAUZE XEROFORM 1X8 LF (GAUZE/BANDAGES/DRESSINGS) ×2 IMPLANT
GLOVE BIO SURGEON STRL SZ7.5 (GLOVE) ×2 IMPLANT
GLOVE BIOGEL PI IND STRL 7.0 (GLOVE) IMPLANT
GLOVE BIOGEL PI IND STRL 8 (GLOVE) ×2 IMPLANT
GLOVE SURG SS PI 6.5 STRL IVOR (GLOVE) IMPLANT
GOWN STRL REUS W/ TWL LRG LVL3 (GOWN DISPOSABLE) ×2 IMPLANT
GOWN STRL REUS W/TWL LRG LVL3 (GOWN DISPOSABLE) ×2
GOWN STRL REUS W/TWL XL LVL3 (GOWN DISPOSABLE) ×2 IMPLANT
NDL 18GX1X1/2 (RX/OR ONLY) (NEEDLE) IMPLANT
NDL HYPO 25X1 1.5 SAFETY (NEEDLE) ×2 IMPLANT
NEEDLE 18GX1X1/2 (RX/OR ONLY) (NEEDLE) ×2 IMPLANT
NEEDLE HYPO 25X1 1.5 SAFETY (NEEDLE) ×4 IMPLANT
NS IRRIG 1000ML POUR BTL (IV SOLUTION) ×2 IMPLANT
PACK BASIN DAY SURGERY FS (CUSTOM PROCEDURE TRAY) ×2 IMPLANT
PADDING CAST ABS COTTON 4X4 ST (CAST SUPPLIES) ×2 IMPLANT
STOCKINETTE 4X48 STRL (DRAPES) ×2 IMPLANT
SUT ETHILON 4 0 PS 2 18 (SUTURE) ×2 IMPLANT
SYR BULB EAR ULCER 3OZ GRN STR (SYRINGE) ×2 IMPLANT
SYR CONTROL 10ML LL (SYRINGE) ×2 IMPLANT
TOWEL GREEN STERILE FF (TOWEL DISPOSABLE) ×4 IMPLANT
UNDERPAD 30X36 HEAVY ABSORB (UNDERPADS AND DIAPERS) ×2 IMPLANT

## 2022-05-03 NOTE — Op Note (Signed)
05/03/2022 [facility]                              OPERATIVE REPORT   PREOPERATIVE DIAGNOSIS:  Bilateral carpal tunnel syndrome.  POSTOPERATIVE DIAGNOSIS:  Bilateral carpal tunnel syndrome.  PROCEDURE:   Right carpal tunnel release Left carpal tunnel injection  SURGEON:  Leanora Cover, MD  ASSISTANT:  none.  ANESTHESIA: Bier block with sedation  IV FLUIDS:  Per anesthesia flow sheet.  ESTIMATED BLOOD LOSS:  Minimal.  COMPLICATIONS:  None.  SPECIMENS:  None.  TOURNIQUET TIME:    Total Tourniquet Time Documented: Forearm (Right) - 22 minutes Total: Forearm (Right) - 22 minutes   DISPOSITION:  Stable to PACU.  LOCATION:  SURGERY CENTER  INDICATIONS:  66 y.o. yo female with numbness and tingling bilateral hands.  Positive nerve conduction studies. She wishes to have right carpal tunnel release and injection of left carpal tunnel.  Risks, benefits and alternatives of surgery were discussed including the risk of blood loss; infection; damage to nerves, vessels, tendons, ligaments, bone; failure of surgery; need for additional surgery; complications with wound healing; continued pain; recurrence of carpal tunnel syndrome; and damage to motor branch. She voiced understanding of these risks and elected to proceed.   OPERATIVE COURSE:  After being identified preoperatively by myself, the patient and I agreed upon the procedure and site of procedure.  The surgical site was marked.  Surgical consent had been signed.  She was given IV Ancef as preoperative antibiotic prophylaxis.  She was transferred to the operating room and placed on the operating room table in supine position with the right upper extremity on an armboard.  Bier block anesthesia was induced by the anesthesiologist.  Right upper extremity was prepped and draped in normal sterile orthopaedic fashion.  A surgical pause was performed between the surgeons, anesthesia, and operating room staff, and all  were in agreement as to the patient, procedure, and site of procedure.  Tourniquet at the proximal aspect of the forearm had been inflated for the Bier block  Incision was made over the transverse carpal ligament and carried into the subcutaneous tissues by spreading technique.  Bipolar electrocautery was used to obtain hemostasis.  The palmar fascia was sharply incised.  The transverse carpal ligament was identified.  The fascia distal to the ligament was opened.  Retractor was placed and the flexor tendons were identified.  The flexor tendon to the little finger was identified and retracted radially.  The transverse carpal ligament was then incised from distal to proximal under direct visualization.  Scissors were used to split the distal aspect of the volar antebrachial fascia.  A finger was placed into the wound to ensure complete decompression, which was the case.  The nerve was examined.  It was adherent to the radial leaflet.  The motor branch was identified and was intact.  The wound was copiously irrigated with sterile saline.  It was then closed with 4-0 nylon in a horizontal mattress fashion.  It was injected with 0.25% plain Marcaine to aid in postoperative analgesia.  It was dressed with sterile Xeroform, 4x4s, an ABD, and wrapped with Kerlix and an Ace bandage.  Tourniquet was deflated at 22 minutes.  Fingertips were pink with brisk capillary refill after deflation of the tourniquet.  Operative drapes were broken down.  The left wrist was prepped with an alcohol wipe.  An injection of the left carpal tunnel was  performed with 1 cc 1% plain lidocaine and 1 cc depomedrol. The injection site was dressed with a bandaid.  The patient was awoken from anesthesia safely.  She was transferred back to stretcher and taken to the PACU in stable condition.  I will see her back in the office in 1 week for postoperative followup.  I will give her a prescription for Tramadol 50 mg 1 tab PO q6 hours prn pain, dispense  # 20.    Leanora Cover, MD Electronically signed, 05/03/22

## 2022-05-03 NOTE — Anesthesia Postprocedure Evaluation (Signed)
Anesthesia Post Note  Patient: Jamie [name]  Procedure(s) Performed: RIGHT CARPAL TUNNEL RELEASE (Right: Hand) STEROID INJECTION LEFT WRIST (Left: Hand)     Patient location during evaluation: PACU Anesthesia Type: MAC and Bier Block Level of consciousness: awake Pain management: pain level controlled Vital Signs Assessment: post-procedure vital signs reviewed and stable Respiratory status: spontaneous breathing, nonlabored ventilation and respiratory function stable Cardiovascular status: stable and blood pressure returned to baseline Postop Assessment: no apparent nausea or vomiting Anesthetic complications: no   No notable events documented.  Last Vitals:  Vitals:   05/03/22 1215 05/03/22 1230  BP: 125/61 137/78  Pulse: 85 73  Resp: 18 (!) 22  Temp: 36.8 C   SpO2: 96% 98%    Last Pain:  Vitals:   05/03/22 1230  TempSrc:   PainSc: 0-No pain                 Nilda Simmer

## 2022-05-03 NOTE — H&P (Signed)
Jamie [name] is an 66 y.o. female.   Chief Complaint: carpal tunnel syndrome HPI: 66 yo female with numbness and tingling bilateral hands.  Positive nerve conduction studies.  Nocturnal symptoms.  She wishes to have right carpal tunnel release and injection left carpal tunnel.  Allergies:  Allergies  Allergen Reactions   Oxycodone-Acetaminophen Itching, Swelling and Other (See Comments)    Tylox- Face became swollen    Past Medical History:  Diagnosis Date   Allergy    Diabetes mellitus    ORAL MEDS-NO INSULIN   Endometrial cancer ([place]) 03/2011   03/2011   GERD (gastroesophageal reflux disease)    Hiatal hernia    History of cervical dysplasia    Hyperlipidemia    Hypertension    Malignant neoplasm of body of uterus ([place]) 03/21/2011   S.p hysterectomy   Ovarian cancer ([place]) 03/2011   03/2011   PONV (postoperative nausea and vomiting)    ONLY AFTER CERVICAL FUSION--NO PROB WITH OTHER SURGERIES   Renal disorder    Ulcer    history of gastritis    Past Surgical History:  Procedure Laterality Date   ACHILLES TENDON SURGERY     MULTIPLE SURGERIES BECAUSE OF STAPH INFECTION   CERVICAL FUSION  09/2003   GYNECOLOGIC CRYOSURGERY  02/2003   LUMBAR FUSION  11/1998   LYMPH NODE DISSECTION  2012   TOTAL VAGINAL HYSTERECTOMY  03/29/2011    Family History: Family History  Problem Relation Age of Onset   Diabetes Father    Heart disease Father    Prostate cancer Father    Lymphoma Father    Lung cancer Mother        smoker   Ovarian cancer Cousin    Uterine cancer Cousin    Breast cancer Maternal Aunt    Colon cancer Paternal Grandmother 43   Hemochromatosis Maternal Grandmother    Diabetes Paternal Grandfather    Lung cancer Sister    Healthy Brother     Social History:   reports that she has never smoked. She has never used smokeless tobacco. She reports that she does not currently use alcohol. She reports that she does not use drugs.  Medications: No  medications prior to admission.    Results for orders placed or performed during the hospital encounter of 05/03/22 (from the past 48 hour(s))  Basic metabolic panel per protocol     Status: Abnormal   Collection Time: 05/01/22  4:00 PM  Result Value Ref Range   Sodium 141 135 - 145 mmol/L   Potassium 4.9 3.5 - 5.1 mmol/L   Chloride 108 98 - 111 mmol/L   CO2 27 22 - 32 mmol/L   Glucose, Bld 184 (H) 70 - 99 mg/dL    Comment: Glucose reference range applies only to samples taken after fasting for at least 8 hours.   BUN 12 8 - 23 mg/dL   Creatinine, Ser 1.06 (H) 0.44 - 1.00 mg/dL   Calcium 9.3 8.9 - 10.3 mg/dL   GFR, Estimated 58 (L) >60 mL/min    Comment: (NOTE) Calculated using the CKD-EPI Creatinine Equation (2021)    Anion gap 6 5 - 15    Comment: Performed at [facility] [address] [place], [place] [address]    No results found.    Height '5\' 2"'$  (1.575 m), weight 61.2 kg.  General appearance: alert, cooperative, and appears stated age Head: Normocephalic, without obvious abnormality, atraumatic Neck: supple, symmetrical, trachea midline Extremities: Intact capillary  refill all digits.  +epl/fpl/io.  No wounds.  Pulses: 2+ and symmetric Skin: Skin color, texture, turgor normal. No rashes or lesions Neurologic: Grossly normal Incision/Wound: none  Assessment/Plan Bilateral carpal tunnel syndrome.  Plan right carpal tunnel release and left carpal tunnel injection.  Non operative and operative treatment options have been discussed with the patient and patient wishes to proceed with operative treatment.  Risks, benefits and alternatives of surgery were discussed including risks of blood loss, infection, damage to nerves/vessels/tendons/ligament/bone, failure of surgery, need for additional surgery, complication with wound healing, stiffness, damage to motor branch  She voiced understanding of these risks and elected to proceed.    Leanora Cover 05/03/2022, 8:43  AM

## 2022-05-03 NOTE — Anesthesia Procedure Notes (Signed)
Anesthesia Regional Block: Bier block (IV Regional)   Pre-Anesthetic Checklist: , timeout performed,  Correct Patient, Correct Site, Correct Laterality,  Correct Procedure,, site marked,  Surgical consent,  At surgeon's request  Laterality: Right and Lower         Needles:  Injection technique: Single-shot  Needle Type: Other      Needle Gauge: 22     Additional Needles:   Procedures:,,,,, intact distal pulses, Esmarch exsanguination,  Single tourniquet utilized    Narrative:  Start time: 05/03/2022 11:50 AM End time: 05/03/2022 11:50 AM Injection made incrementally with aspirations every 30 mL.  Performed by: Personally

## 2022-05-03 NOTE — Transfer of Care (Signed)
Immediate Anesthesia Transfer of Care Note  Patient: Jamie [name]  Procedure(s) Performed: RIGHT CARPAL TUNNEL RELEASE (Right: Hand) STEROID INJECTION LEFT WRIST (Left: Hand)  Patient Location: PACU  Anesthesia Type:MAC and Bier block  Level of Consciousness: awake, alert , and oriented  Airway & Oxygen Therapy: Patient Spontanous Breathing  Post-op Assessment: Report given to RN and Post -op Vital signs reviewed and stable  Post vital signs: Reviewed and stable  Last Vitals:  Vitals Value Taken Time  BP    Temp    Pulse    Resp    SpO2      Last Pain:  Vitals:   05/03/22 0934  TempSrc: Oral  PainSc: 8       Patients Stated Pain Goal: 1 (95/74/73 [id number])  Complications: No notable events documented.

## 2022-05-03 NOTE — Discharge Instructions (Addendum)

## 2022-06-15 DIAGNOSIS — H43811 Vitreous degeneration, right eye: Secondary | ICD-10-CM | POA: Diagnosis not present

## 2022-06-15 DIAGNOSIS — H33322 Round hole, left eye: Secondary | ICD-10-CM | POA: Diagnosis not present

## 2022-06-15 DIAGNOSIS — H35342 Macular cyst, hole, or pseudohole, left eye: Secondary | ICD-10-CM | POA: Diagnosis not present

## 2022-06-15 DIAGNOSIS — H2513 Age-related nuclear cataract, bilateral: Secondary | ICD-10-CM | POA: Diagnosis not present

## 2022-06-26 DIAGNOSIS — R3915 Urgency of urination: Secondary | ICD-10-CM | POA: Diagnosis not present

## 2022-06-26 DIAGNOSIS — R300 Dysuria: Secondary | ICD-10-CM | POA: Diagnosis not present

## 2022-06-27 ENCOUNTER — Encounter: Payer: Self-pay | Admitting: Gastroenterology

## 2022-07-02 ENCOUNTER — Ambulatory Visit (INDEPENDENT_AMBULATORY_CARE_PROVIDER_SITE_OTHER): Payer: Medicare Other | Admitting: Family Medicine

## 2022-07-02 ENCOUNTER — Encounter: Payer: Self-pay | Admitting: Family Medicine

## 2022-07-02 VITALS — BP 120/70 | HR 71 | Temp 97.3°F | Ht 62.0 in | Wt 143.4 lb

## 2022-07-02 DIAGNOSIS — N183 Chronic kidney disease, stage 3 unspecified: Secondary | ICD-10-CM | POA: Diagnosis not present

## 2022-07-02 DIAGNOSIS — Z1283 Encounter for screening for malignant neoplasm of skin: Secondary | ICD-10-CM | POA: Diagnosis not present

## 2022-07-02 DIAGNOSIS — M797 Fibromyalgia: Secondary | ICD-10-CM | POA: Diagnosis not present

## 2022-07-02 DIAGNOSIS — E119 Type 2 diabetes mellitus without complications: Secondary | ICD-10-CM

## 2022-07-02 DIAGNOSIS — E785 Hyperlipidemia, unspecified: Secondary | ICD-10-CM | POA: Diagnosis not present

## 2022-07-02 DIAGNOSIS — I10 Essential (primary) hypertension: Secondary | ICD-10-CM | POA: Diagnosis not present

## 2022-07-02 DIAGNOSIS — E1169 Type 2 diabetes mellitus with other specified complication: Secondary | ICD-10-CM

## 2022-07-02 DIAGNOSIS — E1122 Type 2 diabetes mellitus with diabetic chronic kidney disease: Secondary | ICD-10-CM

## 2022-07-02 DIAGNOSIS — Z0000 Encounter for general adult medical examination without abnormal findings: Secondary | ICD-10-CM | POA: Diagnosis not present

## 2022-07-02 MED ORDER — ALPRAZOLAM 0.5 MG PO TABS: 0.5000 mg | ORAL_TABLET | Freq: Every evening | ORAL | 2 refills | Status: DC | PRN

## 2022-07-02 MED ORDER — ONDANSETRON 4 MG PO TBDP: 4.0000 mg | ORAL_TABLET | Freq: Three times a day (TID) | ORAL | 0 refills | Status: DC | PRN

## 2022-07-02 NOTE — Progress Notes (Signed)
Phone [phone]   Subjective:  Patient presents today for their annual physical. Chief complaint-noted.   See problem oriented charting- ROS- full  review of systems was completed and negative except for: some dysuria and polyuria  The following were reviewed and entered/updated in epic: Past Medical History:  Diagnosis Date   Allergy    Diabetes mellitus    ORAL MEDS-NO INSULIN   Endometrial cancer ([place]) 03/2011   03/2011   GERD (gastroesophageal reflux disease)    Hiatal hernia    History of cervical dysplasia    Hyperlipidemia    Hypertension    Malignant neoplasm of body of uterus ([place]) 03/21/2011   S.p hysterectomy   Ovarian cancer ([place]) 03/2011   03/2011   PONV (postoperative nausea and vomiting)    ONLY AFTER CERVICAL FUSION--NO PROB WITH OTHER SURGERIES   Renal disorder    Ulcer    history of gastritis   Patient Active Problem List   Diagnosis Date Noted   History of sepsis 11/01/2019    Priority: High   History of endometrial cancer 05/12/2019    Priority: High   History of ovarian cancer 03/27/2018    Priority: High   Diabetes mellitus, type 2 ([place]) 04/25/2007    Priority: High   Osteopenia 10/31/2020    Priority: Medium    CKD stage 3 due to type 2 diabetes mellitus ([place]) 10/12/2020    Priority: Medium    Lymphedema of leg 08/27/2011    Priority: Medium    Hyperlipidemia associated with type 2 diabetes mellitus ([place]) 01/27/2007    Priority: Medium    Essential hypertension 01/27/2007    Priority: Medium    Fibromyalgia 01/27/2007    Priority: Medium    Senile purpura ([place]) 01/14/2020    Priority: Low   Bunion of great toe 09/08/2019    Priority: Low   Rash 10/28/2018    Priority: Low   Abdominal pain 05/22/2012    Priority: Low   Nonspecific elevation of levels of transaminase or lactic acid dehydrogenase (LDH) 08/03/2010    Priority: Low   Allergic rhinitis 01/27/2007    Priority: Low   GERD 01/27/2007    Priority: Low    MENOPAUSE-RELATED VASOMOTOR SYMPTOMS, HOT FLASHES 01/27/2007    Priority: Low   Atypical chest pain 01/27/2007    Priority: Low   Past Surgical History:  Procedure Laterality Date   ACHILLES TENDON SURGERY     MULTIPLE SURGERIES BECAUSE OF STAPH INFECTION   CARPAL TUNNEL RELEASE Right 05/03/2022   Procedure: RIGHT CARPAL TUNNEL RELEASE;  Surgeon: Leanora Cover, MD;  Location: [facility];  Service: Orthopedics;  Laterality: Right;  Bier block   CERVICAL FUSION  09/05/2003   GYNECOLOGIC CRYOSURGERY  02/05/2003   LUMBAR FUSION  11/05/1998   LYMPH NODE DISSECTION  05/07/2010   PARS PLANA VITRECTOMY W/ REPAIR OF MACULAR HOLE Left    november 2023   STERIOD INJECTION Left 05/03/2022   Procedure: STEROID INJECTION LEFT WRIST;  Surgeon: Leanora Cover, MD;  Location: [facility];  Service: Orthopedics;  Laterality: Left;   TOTAL VAGINAL HYSTERECTOMY  03/29/2011    Family History  Problem Relation Age of Onset   Diabetes Father    Heart disease Father    Prostate cancer Father    Lymphoma Father    Lung cancer Mother        smoker   Ovarian cancer Cousin    Uterine cancer Cousin    Breast cancer Maternal Aunt  Colon cancer Paternal Grandmother 41   Hemochromatosis Maternal Grandmother    Diabetes Paternal Grandfather    Lung cancer Sister    Healthy Brother     Medications- reviewed and updated Current Outpatient Medications  Medication Sig Dispense Refill   benazepril (LOTENSIN) 10 MG tablet TAKE 1 TABLET(10 MG) BY MOUTH DAILY 90 tablet 0   COLLAGEN PO Take by mouth.     cyclobenzaprine (FLEXERIL) 10 MG tablet Take 10 mg by mouth at bedtime.      glimepiride (AMARYL) 2 MG tablet TAKE 1 TABLET(2 MG) BY MOUTH DAILY 90 tablet 3   Multiple Vitamins-Minerals (MULTIVITAMIN WITH MINERALS) tablet Take 1 tablet by mouth daily.     rosuvastatin (CRESTOR) 40 MG tablet TAKE 1 TABLET(40 MG) BY MOUTH DAILY 90 tablet 3   triamcinolone cream (KENALOG) 0.1 % APPLY  TOPICALLY TO THE AFFECTED AREA TWICE DAILY FOR 10 DAYS. MAXIMUM AND THEN TAKE AT LEAST 10 DAY BREAK 30 g 3   ALPRAZolam (XANAX) 0.5 MG tablet Take 1 tablet (0.5 mg total) by mouth at bedtime as needed for anxiety or sleep. 30 tablet 2   ondansetron (ZOFRAN ODT) 4 MG disintegrating tablet Take 1 tablet (4 mg total) by mouth every 8 (eight) hours as needed for nausea or vomiting. 20 tablet 0   No current facility-administered medications for this visit.    Allergies-reviewed and updated Allergies  Allergen Reactions   Oxycodone-Acetaminophen Itching, Swelling and Other (See Comments)    Tylox- Face became swollen    Social History   Social History Narrative   Married. 2 children (biological son 40, 1 step son 64 in 2017). 4 grandkids.       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    Retiring age 51.    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       Hobbies: roller skating- works at [employer] too! Also walks regularly. Thinking about doing bicycling on trainer.    Objective  Objective:  BP 120/70   Pulse 71   Temp (!) 97.3 F (36.3 C)   Ht '5\' 2"'$  (1.575 m)   Wt 143 lb 6.4 oz (65 kg)   LMP  (LMP Unknown)   SpO2 98%   BMI 26.23 kg/m  Gen: NAD, resting comfortably HEENT: Mucous membranes are moist. Oropharynx normal Neck: no thyromegaly CV: RRR no murmurs rubs or gallops Lungs: CTAB no crackles, wheeze, rhonchi Abdomen: soft/nontender/nondistended/normal bowel sounds. No rebound or guarding.  Ext: trace edema R > L Skin: warm, dry Neuro: grossly normal, moves all extremities, PERRLA    Diabetic Foot Exam - Simple   Simple Foot Form Diabetic Foot exam was performed with the following findings: Yes 07/02/2022  3:06 PM  Visual Inspection No deformities, no ulcerations, no other skin breakdown bilaterally: Yes Sensation Testing Intact to  touch and monofilament testing bilaterally: Yes Pulse Check Posterior Tibialis and Dorsalis pulse intact bilaterally: Yes Comments       Assessment and Plan   67 y.o. female presenting for annual physical.  Health Maintenance counseling: 1. Anticipatory guidance: Patient counseled regarding regular dental exams -q6 months, eye exams - yearly at least- more regular recently,  avoiding smoking and second hand smoke , limiting alcohol to 1 beverage per day- 1 in 2 years, no illicit drugs.   2. Risk factor reduction:  Advised patient of need  for regular exercise and diet rich and fruits and vegetables to reduce risk of heart attack and stroke.  Exercise- not right- on her feet a lot with work- did still encourage 150 minutes per week- working at [employer] some and skating some and getting some walking in. Has exercise bike that she can use Diet/weight management- has tried to cut down on portions at work- spicy chicken sandwich Wt Readings from Last 3 Encounters:  07/02/22 143 lb 6.4 oz (65 kg)  05/03/22 144 lb 6.4 oz (65.5 kg)  12/29/21 145 lb 3.2 oz (65.9 kg)  3. Immunizations/screenings/ancillary studies-declines COVID-19 vaccination.  Declines flu shot.  Declines RSV.  She will let us know on shingrix shot.  Immunization History  Administered Date(s) Administered   Fluad Quad(high Dose 65+) 01/14/2020   Influenza,inj,Quad PF,6+ Mos 03/19/2013, 03/27/2018   Influenza-Unspecified 01/06/2020   PFIZER(Purple Top)SARS-COV-2 Vaccination 08/13/2019, 09/07/2019   PPD Test 01/03/2021   Pneumococcal Conjugate-13 08/25/2021   Pneumococcal Polysaccharide-23 04/28/2013   Tdap 07/26/2014   Zoster Recombinat (Shingrix) 10/16/2021   4. Cervical cancer screening- follows with GYN still-technically past age based screening recommendations-does have history of uterine and ovarian cancer. Sees annually 5. Breast cancer screening-  breast exam with GYN and mammogram scheduled on next Wednesday- we need  record of old one  6. Colon cancer screening - on May 20, 2015 with 7-year repeat- encouraged her to call to schedule- she was sent letter.   7. Skin cancer screening-needs new derm referral.  advised regular sunscreen use. Denies worrisome, changing, or new skin lesions.  8. Birth control/STD check- monogamous/postmenopausal  9. Osteoporosis screening at 58- low bone density/osteopenia noted 2022  RecommendedWeightbearing exercise/calcium/vitamin D-will be due June 2024- update next visit 10. Smoking associated screening -never smoker  Status of chronic or acute concerns   #macular hole surgery but will now need cataract surgery which resulted form treatment on that eye- left eye.   #right carpal tunnel surgery and left side injection- she is doing much better  #UTI symptoms- being treated by GYN- nitrofurantoin- she may need to go back for repeat evaluation.   # Diabetes S: Medication: amaryl 2 mg - nasuea on metformin even lower doses -cbg 105 lats 2 days but can be higher . Does do 8 oz soda a day- advised against Lab Results  Component Value Date   HGBA1C 8.4 (H) 12/29/2021   HGBA1C 6.3 (H) 08/25/2021   HGBA1C 6.1 (H) 02/10/2021  A/P: diabetes was poorly controlled- 9 or less could consider ozempic- above this we may be looking at insulin range.  -currently if sugar over 200 takes extra pill at times  #hypertension S: medication: benazepril 10 mg BP Readings from Last 3 Encounters:  07/02/22 120/70  05/03/22 (!) 150/70  12/29/21 122/70  A/P: stable- continue current medicines   # Anxiety S:Medication: alprazolam 0.5 mg - uses as needed for anxiety or sleep- has needed more lately 2-3 nights a week A/P: slightly worse- warned of dementia risk- continue current medications    #hyperlipidemia S: Medication: planned atorvastatin '40mg'$ --> rosuvastatin '40mg'$  in 09/2019 with LDL over 100 Lab Results  Component Value Date   CHOL 162 02/10/2021   HDL 30 (L) 02/10/2021    [place]  02/10/2021     Comment:     . LDL cholesterol not calculated. Triglyceride levels greater than 400 mg/dL invalidate calculated LDL results. . Reference range: <100 . Desirable range <100 mg/dL for primary prevention;   <70 mg/dL for patients with CHD  or diabetic patients  with > or = 2 CHD risk factors. Marland Kitchen LDL-C is now calculated using the Martin-Hopkins  calculation, which is a validated novel method providing  better accuracy than the Friedewald equation in the  estimation of LDL-C.  Cresenciano Genre et al. Annamaria Helling. WG:[id number]): 2061-2068  (http://education.Quest[employer].com/faq/FAQ164)    LDLDIRECT 101.0 09/08/2019   TRIG 492 (H) 02/10/2021   CHOLHDL 5.4 (H) 02/10/2021  A/P: hopefully improved update LDL today. Continue current meds for now  # Low Bone density (formerly osteopenia) S: Last DEXA: October 26, 2020  Your hip fracture risk is 3.8% which is above the 3% desired before considering medications that are more advanced such as Fosamax-if you would like to discuss Fosamax we can certainly do this at a visit or we can buckle down calcium, vitamin D, weightbearing exercise and recheck in 2 years   Calcium: '1200mg'$  (through diet ok) recommended - taking Vitamin D: 1000 units a day recommended- taking A/P: too soon for repeat- consider update next visit   #prior nausea after eye surgery- wants to have zofran on hand unless recurs  Recommended follow up: Return in about 4 months (around 10/31/2022) for followup or sooner if needed.Schedule b4 you leave. Future Appointments  Date Time Provider [place]  09/14/2022  2:30 PM [facility] HE[facility] COACH [facility]   Lab/Order associations: fasting   ICD-10-CM   1. Preventative health care  Z00.00     2. Type 2 diabetes mellitus without complication, without long-term current use of insulin (HCC)  E11.9 Hemoglobin A1c    Microalbumin / creatinine urine ratio    CBC with Differential/Platelet    Comprehensive metabolic  panel    Lipid panel    3. CKD stage 3 due to type 2 diabetes mellitus (HCC)  E11.22 CBC with Differential/Platelet   N18.30 Comprehensive metabolic panel    4. Essential hypertension  I10     5. Hyperlipidemia associated with type 2 diabetes mellitus (HCC)  E11.69 Lipid panel   E78.5     6. Fibromyalgia  M79.7     7. Screening exam for skin cancer  Z12.83 Ambulatory referral to Dermatology      Meds ordered this encounter  Medications   ALPRAZolam (XANAX) 0.5 MG tablet    Sig: Take 1 tablet (0.5 mg total) by mouth at bedtime as needed for anxiety or sleep.    Dispense:  30 tablet    Refill:  2   ondansetron (ZOFRAN ODT) 4 MG disintegrating tablet    Sig: Take 1 tablet (4 mg total) by mouth every 8 (eight) hours as needed for nausea or vomiting.    Dispense:  20 tablet    Refill:  0    Return precautions advised.  Garret Reddish, MD

## 2022-07-02 NOTE — Patient Instructions (Addendum)
Let us know when you get your 2nd [facility] vaccine.  Sign release of information at the check out desk for MAMMOGRAM.  Health Maintenance Due  Topic Date Due   COLONOSCOPY- [facility] contact Please call to schedule visit and/or procedure Address: [place], [place],  [address] Phone: [phone]  05/19/2022   We will either call you (or see alternate below) within two weeks about your referral to dermatology . Our referral specialist will sometimes also send you a mychart link once referral is approved and then you will call the # listed on there (let us know if you do not see this within 2 weeks or have not received call)  Recommended follow up: Return in about 4 months (around 10/31/2022) for followup or sooner if needed.Schedule b4 you leave.  Or 6 months if a1c above 7

## 2022-07-03 ENCOUNTER — Other Ambulatory Visit: Payer: Self-pay | Admitting: Family Medicine

## 2022-07-03 LAB — LIPID PANEL
Cholesterol: 113 mg/dL (ref 0–200)
HDL: 39.1 mg/dL (ref >39.00)
Total CHOL/HDL Ratio: 3
Triglycerides: 407 mg/dL — ABNORMAL HIGH (ref 0.0–149.0)

## 2022-07-03 LAB — COMPREHENSIVE METABOLIC PANEL
ALT: 23 U/L (ref 0–35)
AST: 19 U/L (ref 0–37)
Albumin: 4.1 g/dL (ref 3.5–5.2)
Alkaline Phosphatase: 81 U/L (ref 39–117)
BUN: 17 mg/dL (ref 6–23)
CO2: 27 mEq/L (ref 19–32)
Calcium: 9.8 mg/dL (ref 8.4–10.5)
Chloride: 105 mEq/L (ref 96–112)
Creatinine, Ser: 1.28 mg/dL — ABNORMAL HIGH (ref 0.40–1.20)
GFR: 43.41 mL/min — ABNORMAL LOW (ref >60.00)
Glucose, Bld: 90 mg/dL (ref 70–99)
Potassium: 4.6 mEq/L (ref 3.5–5.1)
Sodium: 139 mEq/L (ref 135–145)
Total Bilirubin: 0.4 mg/dL (ref 0.2–1.2)
Total Protein: 6.1 g/dL (ref 6.0–8.3)

## 2022-07-03 LAB — CBC WITH DIFFERENTIAL/PLATELET
Basophils Absolute: 0.1 10*3/uL (ref 0.0–0.1)
Basophils Relative: 1.3 % (ref 0.0–3.0)
Eosinophils Absolute: 0.8 10*3/uL — ABNORMAL HIGH (ref 0.0–0.7)
Eosinophils Relative: 8.8 % — ABNORMAL HIGH (ref 0.0–5.0)
HCT: 41.3 % (ref 36.0–46.0)
Hemoglobin: 13.8 g/dL (ref 12.0–15.0)
Lymphocytes Relative: 26.7 % (ref 12.0–46.0)
Lymphs Abs: 2.5 10*3/uL (ref 0.7–4.0)
MCHC: 33.4 g/dL (ref 30.0–36.0)
MCV: 88.5 fl (ref 78.0–100.0)
Monocytes Absolute: 0.6 10*3/uL (ref 0.1–1.0)
Monocytes Relative: 6.8 % (ref 3.0–12.0)
Neutro Abs: 5.4 10*3/uL (ref 1.4–7.7)
Neutrophils Relative %: 56.4 % (ref 43.0–77.0)
Platelets: 271 10*3/uL (ref 150.0–400.0)
RBC: 4.66 Mil/uL (ref 3.87–5.11)
RDW: 13.5 % (ref 11.5–15.5)
WBC: 9.5 10*3/uL (ref 4.0–10.5)

## 2022-07-03 LAB — HEMOGLOBIN A1C: Hgb A1c MFr Bld: 8.3 % — ABNORMAL HIGH (ref 4.6–6.5)

## 2022-07-03 LAB — LDL CHOLESTEROL, DIRECT: Direct LDL: 40 mg/dL

## 2022-07-03 MED ORDER — OZEMPIC (0.25 OR 0.5 MG/DOSE) 2 MG/3ML ~~LOC~~ SOPN: PEN_INJECTOR | SUBCUTANEOUS | 0 refills | Status: AC

## 2022-07-04 LAB — MICROALBUMIN / CREATININE URINE RATIO
Creatinine,U: 109.7 mg/dL
Microalb Creat Ratio: 5.7 mg/g (ref 0.0–30.0)
Microalb, Ur: 6.3 mg/dL — ABNORMAL HIGH (ref 0.0–1.9)

## 2022-07-11 DIAGNOSIS — Z1231 Encounter for screening mammogram for malignant neoplasm of breast: Secondary | ICD-10-CM | POA: Diagnosis not present

## 2022-07-29 ENCOUNTER — Other Ambulatory Visit: Payer: Self-pay | Admitting: Family Medicine

## 2022-08-29 ENCOUNTER — Telehealth: Payer: Self-pay | Admitting: Family Medicine

## 2022-08-29 NOTE — Telephone Encounter (Signed)
Copied from CRM [id number]. Topic: Medicare AWV >> Aug 29, 2022  8:58 AM Gwenith Spitz wrote: Reason for CRM: Called patient to schedule Medicare Annual Wellness Visit (AWV). Left message for patient to call back and schedule Medicare Annual Wellness Visit (AWV).  Last date of AWV: 09/01/2021  Please schedule an appointment at any time with Inetta Fermo, [facility]. Please schedule AWVS with Inetta Fermo, [facility].  If any questions, please contact me at [phone].  Thank you ,  Gabriel Cirri [facility] AWV TEAM Direct Dial [phone]

## 2022-09-06 ENCOUNTER — Other Ambulatory Visit: Payer: Self-pay

## 2022-09-06 MED ORDER — BENAZEPRIL HCL 10 MG PO TABS: ORAL_TABLET | ORAL | 2 refills | Status: DC

## 2022-09-10 ENCOUNTER — Telehealth: Payer: Self-pay | Admitting: Family Medicine

## 2022-09-10 NOTE — Telephone Encounter (Signed)
Copied from CRM [id number]. Topic: Medicare AWV >> Sep 10, 2022  9:39 AM Gwenith Spitz wrote: Reason for CRM: Called patient to reschedule Medicare Annual Wellness Visit (AWV). Left message for patient to call back and reschedule Medicare Annual Wellness Visit (AWV).  Last date of AWV: 09/01/2021  Please reschedule an appointment at any time with Inetta Fermo, [facility]. Please reschedule AWVS with Inetta Fermo, [facility].  If any questions, please contact me at [phone].  Thank you ,  Gabriel Cirri [facility] AWV TEAM Direct Dial [phone]

## 2022-09-14 ENCOUNTER — Telehealth: Payer: Self-pay | Admitting: Family Medicine

## 2022-09-14 NOTE — Telephone Encounter (Signed)
Called and lm on pt vm stating that I would be more than glad to resend in the Rx for her, however her insurance may not cover it. I also advised that she can call and check with her insurance to see what to do in this situation.

## 2022-09-14 NOTE — Telephone Encounter (Signed)
Pt states she went to pick up her RX  benazepril (LOTENSIN) 10 MG tablet  And they told her she or someone had already picked it up. She does not remember picking it up and she does not have a receipt for it. Please advise.

## 2022-09-18 ENCOUNTER — Telehealth: Payer: Self-pay

## 2022-09-18 NOTE — Patient Outreach (Signed)
  Care Coordination   09/18/2022 Name: Jamie [name] MRN: [id number] DOB: [birth year]   Care Coordination Outreach Attempts:  An unsuccessful telephone outreach was attempted today to offer the patient information about available care coordination services.  Follow Up Plan:  Additional outreach attempts will be made to offer the patient care coordination information and services.   Encounter Outcome:  No Answer   Care Coordination Interventions:  No, not indicated    Bary Leriche, RN, MSN [facility] Care Management Care Management Coordinator Direct Line [phone]

## 2022-09-19 LAB — HM MAMMOGRAPHY

## 2022-09-24 ENCOUNTER — Telehealth: Payer: Self-pay | Admitting: Family Medicine

## 2022-09-24 MED ORDER — ONDANSETRON 4 MG PO TBDP: 4.0000 mg | ORAL_TABLET | Freq: Three times a day (TID) | ORAL | 0 refills | Status: DC | PRN

## 2022-09-24 NOTE — Telephone Encounter (Signed)
Prescription Request  09/24/2022  LOV: 07/02/2022  What is the name of the medication or equipment? ondansetron (ZOFRAN ODT) 4 MG disintegrating tablet   Have you contacted your pharmacy to request a refill? Yes   Which pharmacy would you like this sent to?   [id number] [place] Otto,  - [address] AT [facility] [address] [facility] [address] [place] [address] Phone: [phone] Fax: [phone]    Patient notified that their request is being sent to the clinical staff for review and that they should receive a response within 2 business days.   Please advise at Mobile [phone] (mobile)

## 2022-09-24 NOTE — Telephone Encounter (Signed)
Rx sent to requested pharmacy

## 2022-10-15 ENCOUNTER — Ambulatory Visit (INDEPENDENT_AMBULATORY_CARE_PROVIDER_SITE_OTHER): Payer: Medicare Other

## 2022-10-15 VITALS — Wt 135.0 lb

## 2022-10-15 DIAGNOSIS — Z0000 Encounter for general adult medical examination without abnormal findings: Secondary | ICD-10-CM | POA: Diagnosis not present

## 2022-10-15 NOTE — Patient Instructions (Signed)
Jamie [name] , Thank you for taking time to come for your Medicare Wellness Visit. I appreciate your ongoing commitment to your health goals. Please review the following plan we discussed and let me know if I can assist you in the future.   These are the goals we discussed:  Goals      Patient Stated     Lose 12 lbs         This is a list of the screening recommended for you and due dates:  Health Maintenance  Topic Date Due   COVID-19 Vaccine (3 - Pfizer risk series) 10/05/2019   Mammogram  09/30/2020   Zoster (Shingles) Vaccine (2 of 2) 12/11/2021   Colon Cancer Screening  05/19/2022   Medicare Annual Wellness Visit  09/02/2022   Hepatitis C Screening  06/04/2109*   Flu Shot  12/06/2022   Hemoglobin A1C  12/31/2022   Eye exam for diabetics  04/24/2023   Yearly kidney function blood test for diabetes  07/03/2023   Yearly kidney health urinalysis for diabetes  07/03/2023   Complete foot exam   07/03/2023   DTaP/Tdap/Td vaccine (2 - Td or Tdap) 07/25/2024   Pneumonia Vaccine (3 of 3 - PPSV23 or PCV20) 08/26/2026   DEXA scan (bone density measurement)  Completed   HPV Vaccine  Aged Out  *Topic was postponed. The date shown is not the original due date.    Advanced directives:   Conditions/risks identified: get down to 125 lbs   Next appointment: Follow up in one year for your annual wellness visit    Preventive Care 65 Years and Older, Female Preventive care refers to lifestyle choices and visits with your health care provider that can promote health and wellness. What does preventive care include? A yearly physical exam. This is also called an annual well check. Dental exams once or twice a year. Routine eye exams. Ask your health care provider how often you should have your eyes checked. Personal lifestyle choices, including: Daily care of your teeth and gums. Regular physical activity. Eating a healthy diet. Avoiding tobacco and drug use. Limiting alcohol  use. Practicing safe sex. Taking low-dose aspirin every day. Taking vitamin and mineral supplements as recommended by your health care provider. What happens during an annual well check? The services and screenings done by your health care provider during your annual well check will depend on your age, overall health, lifestyle risk factors, and family history of disease. Counseling  Your health care provider may ask you questions about your: Alcohol use. Tobacco use. Drug use. Emotional well-being. Home and relationship well-being. Sexual activity. Eating habits. History of falls. Memory and ability to understand (cognition). Work and work Astronomer. Reproductive health. Screening  You may have the following tests or measurements: Height, weight, and BMI. Blood pressure. Lipid and cholesterol levels. These may be checked every 5 years, or more frequently if you are over 96 years old. Skin check. Lung cancer screening. You may have this screening every year starting at age 57 if you have a 30-pack-year history of smoking and currently smoke or have quit within the past 15 years. Fecal occult blood test (FOBT) of the stool. You may have this test every year starting at age 67. Flexible sigmoidoscopy or colonoscopy. You may have a sigmoidoscopy every 5 years or a colonoscopy every 10 years starting at age 62. Hepatitis C blood test. Hepatitis B blood test. Sexually transmitted disease (STD) testing. Diabetes screening. This is done by checking your blood  sugar (glucose) after you have not eaten for a while (fasting). You may have this done every 1-3 years. Bone density scan. This is done to screen for osteoporosis. You may have this done starting at age 60. Mammogram. This may be done every 1-2 years. Talk to your health care provider about how often you should have regular mammograms. Talk with your health care provider about your test results, treatment options, and if necessary,  the need for more tests. Vaccines  Your health care provider may recommend certain vaccines, such as: Influenza vaccine. This is recommended every year. Tetanus, diphtheria, and acellular pertussis (Tdap, Td) vaccine. You may need a Td booster every 10 years. Zoster vaccine. You may need this after age 10. Pneumococcal 13-valent conjugate (PCV13) vaccine. One dose is recommended after age 69. Pneumococcal polysaccharide (PPSV23) vaccine. One dose is recommended after age 61. Talk to your health care provider about which screenings and vaccines you need and how often you need them. This information is not intended to replace advice given to you by your health care provider. Make sure you discuss any questions you have with your health care provider. Document Released: 05/20/2015 Document Revised: 01/11/2016 Document Reviewed: 02/22/2015 Elsevier Interactive Patient Education  2017 [place] Prevention in the Home Falls can cause injuries. They can happen to people of all ages. There are many things you can do to make your home safe and to help prevent falls. What can I do on the outside of my home? Regularly fix the edges of walkways and driveways and fix any cracks. Remove anything that might make you trip as you walk through a door, such as a raised step or threshold. Trim any bushes or trees on the path to your home. Use bright outdoor lighting. Clear any walking paths of anything that might make someone trip, such as rocks or tools. Regularly check to see if handrails are loose or broken. Make sure that both sides of any steps have handrails. Any raised decks and porches should have guardrails on the edges. Have any leaves, snow, or ice cleared regularly. Use sand or salt on walking paths during winter. Clean up any spills in your garage right away. This includes oil or grease spills. What can I do in the bathroom? Use night lights. Install grab bars by the toilet and in the  tub and shower. Do not use towel bars as grab bars. Use non-skid mats or decals in the tub or shower. If you need to sit down in the shower, use a plastic, non-slip stool. Keep the floor dry. Clean up any water that spills on the floor as soon as it happens. Remove soap buildup in the tub or shower regularly. Attach bath mats securely with double-sided non-slip rug tape. Do not have throw rugs and other things on the floor that can make you trip. What can I do in the bedroom? Use night lights. Make sure that you have a light by your bed that is easy to reach. Do not use any sheets or blankets that are too big for your bed. They should not hang down onto the floor. Have a firm chair that has side arms. You can use this for support while you get dressed. Do not have throw rugs and other things on the floor that can make you trip. What can I do in the kitchen? Clean up any spills right away. Avoid walking on wet floors. Keep items that you use a lot in easy-to-reach  places. If you need to reach something above you, use a strong step stool that has a grab bar. Keep electrical cords out of the way. Do not use floor polish or wax that makes floors slippery. If you must use wax, use non-skid floor wax. Do not have throw rugs and other things on the floor that can make you trip. What can I do with my stairs? Do not leave any items on the stairs. Make sure that there are handrails on both sides of the stairs and use them. Fix handrails that are broken or loose. Make sure that handrails are as long as the stairways. Check any carpeting to make sure that it is firmly attached to the stairs. Fix any carpet that is loose or worn. Avoid having throw rugs at the top or bottom of the stairs. If you do have throw rugs, attach them to the floor with carpet tape. Make sure that you have a light switch at the top of the stairs and the bottom of the stairs. If you do not have them, ask someone to add them for  you. What else can I do to help prevent falls? Wear shoes that: Do not have high heels. Have rubber bottoms. Are comfortable and fit you well. Are closed at the toe. Do not wear sandals. If you use a stepladder: Make sure that it is fully opened. Do not climb a closed stepladder. Make sure that both sides of the stepladder are locked into place. Ask someone to hold it for you, if possible. Clearly mark and make sure that you can see: Any grab bars or handrails. First and last steps. Where the edge of each step is. Use tools that help you move around (mobility aids) if they are needed. These include: Canes. Walkers. Scooters. Crutches. Turn on the lights when you go into a dark area. Replace any light bulbs as soon as they burn out. Set up your furniture so you have a clear path. Avoid moving your furniture around. If any of your floors are uneven, fix them. If there are any pets around you, be aware of where they are. Review your medicines with your doctor. Some medicines can make you feel dizzy. This can increase your chance of falling. Ask your doctor what other things that you can do to help prevent falls. This information is not intended to replace advice given to you by your health care provider. Make sure you discuss any questions you have with your health care provider. Document Released: 02/17/2009 Document Revised: 09/29/2015 Document Reviewed: 05/28/2014 Elsevier Interactive Patient Education  2017 [employer].

## 2022-10-15 NOTE — Progress Notes (Signed)
I connected with  Jamie [name] on 10/15/22 by a audio enabled telemedicine application and verified that I am speaking with the correct person using two identifiers.  Patient Location: Home  Provider Location: Office/Clinic  I discussed the limitations of evaluation and management by telemedicine. The patient expressed understanding and agreed to proceed.   Subjective:   Jamie [name] is a 67 y.o. female who presents for an Initial Medicare Annual Wellness Visit.  Review of Systems     Cardiac Risk Factors include: advanced age (>82men, >11 women);diabetes mellitus;hypertension;dyslipidemia     Objective:    Today's Vitals   10/15/22 1535  Weight: 135 lb (61.2 kg)   Body mass index is 24.69 kg/m.     10/15/2022    3:42 PM 05/03/2022    9:31 AM 09/01/2021    2:39 PM 11/01/2019    3:29 PM 04/15/2018   12:10 PM 05/04/2016   11:54 AM 05/06/2015    7:57 AM  Advanced Directives  Does Patient Have a Medical Advance Directive? No No No No No No No  Does patient want to make changes to medical advance directive?   Yes (MAU/Ambulatory/Procedural Areas - Information given)      Would patient like information on creating a medical advance directive? No - Patient declined No - Patient declined  No - Patient declined Yes (MAU/Ambulatory/Procedural Areas - Information given)      Current Medications (verified) Outpatient Encounter Medications as of 10/15/2022  Medication Sig   ALPRAZolam (XANAX) 0.5 MG tablet Take 1 tablet (0.5 mg total) by mouth at bedtime as needed for anxiety or sleep.   benazepril (LOTENSIN) 10 MG tablet TAKE 1 TABLET(10 MG) BY MOUTH DAILY   COLLAGEN PO Take by mouth.   cyclobenzaprine (FLEXERIL) 10 MG tablet Take 10 mg by mouth at bedtime.    glimepiride (AMARYL) 2 MG tablet TAKE 1 TABLET(2 MG) BY MOUTH DAILY   Multiple Vitamins-Minerals (MULTIVITAMIN WITH MINERALS) tablet Take 1 tablet by mouth daily.   ondansetron (ZOFRAN ODT) 4 MG disintegrating  tablet Take 1 tablet (4 mg total) by mouth every 8 (eight) hours as needed for nausea or vomiting.   rosuvastatin (CRESTOR) 40 MG tablet TAKE 1 TABLET(40 MG) BY MOUTH DAILY   triamcinolone cream (KENALOG) 0.1 % APPLY TOPICALLY TO THE AFFECTED AREA TWICE DAILY FOR 10 DAYS. MAXIMUM AND THEN TAKE AT LEAST 10 DAY BREAK   No facility-administered encounter medications on file as of 10/15/2022.    Allergies (verified) Metformin and Oxycodone-acetaminophen   History: Past Medical History:  Diagnosis Date   Allergy    Diabetes mellitus    ORAL MEDS-NO INSULIN   Endometrial cancer (HCC) 03/2011   03/2011   GERD (gastroesophageal reflux disease)    Hiatal hernia    History of cervical dysplasia    Hyperlipidemia    Hypertension    Malignant neoplasm of body of uterus (HCC) 03/21/2011   S.p hysterectomy   Ovarian cancer (HCC) 03/2011   03/2011   PONV (postoperative nausea and vomiting)    ONLY AFTER CERVICAL FUSION--NO PROB WITH OTHER SURGERIES   Renal disorder    Ulcer    history of gastritis   Past Surgical History:  Procedure Laterality Date   ACHILLES TENDON SURGERY     MULTIPLE SURGERIES BECAUSE OF STAPH INFECTION   CARPAL TUNNEL RELEASE Right 05/03/2022   Procedure: RIGHT CARPAL TUNNEL RELEASE;  Surgeon: Betha Loa, MD;  Location: [facility];  Service: Orthopedics;  Laterality: Right;  Bier block   CERVICAL FUSION  09/05/2003   EYE SURGERY Left    macular whole   GYNECOLOGIC CRYOSURGERY  02/05/2003   LUMBAR FUSION  11/05/1998   LYMPH NODE DISSECTION  05/07/2010   MINOR CARPAL TUNNEL Right    PARS PLANA VITRECTOMY W/ REPAIR OF MACULAR HOLE Left    november 2023   STERIOD INJECTION Left 05/03/2022   Procedure: STEROID INJECTION LEFT WRIST;  Surgeon: Betha Loa, MD;  Location: [facility];  Service: Orthopedics;  Laterality: Left;   TOTAL VAGINAL HYSTERECTOMY  03/29/2011   Family History  Problem Relation Age of Onset   Diabetes Father     Heart disease Father    Prostate cancer Father    Lymphoma Father    Lung cancer Mother        smoker   Ovarian cancer Cousin    Uterine cancer Cousin    Breast cancer Maternal Aunt    Colon cancer Paternal Grandmother 50   Hemochromatosis Maternal Grandmother    Diabetes Paternal Grandfather    Lung cancer Sister    Healthy Brother    Social History   Socioeconomic History   Marital status: Married    Spouse name: Not on file   Number of children: 1   Years of education: Not on file   Highest education level: Not on file  Occupational History    Comment: receptionist  Tobacco Use   Smoking status: Never   Smokeless tobacco: Never  Vaping Use   Vaping Use: Never used  Substance and Sexual Activity   Alcohol use: Not Currently    Comment: once every 2-3 months   Drug use: Never   Sexual activity: Yes  Other Topics Concern   Not on file  Social History Narrative   Married. 2 children (biological son 84, 1 step son 56 in 2017). 4 grandkids.       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    Retiring age 89.    [place]. Kayaks, canoes, cabins- rents out.       Hobbies: roller skating- works at [employer] too! Also walks regularly. Thinking about doing bicycling on trainer.    Social Determinants of Health   Financial Resource Strain: Low Risk  (10/15/2022)   Overall Financial Resource Strain (CARDIA)    Difficulty of Paying Living Expenses: Not hard at all  Food Insecurity: No Food Insecurity (10/15/2022)   Hunger Vital Sign    Worried About Running Out of Food in the Last Year: Never true    Ran Out of Food in the Last Year: Never true  Transportation Needs: No Transportation Needs (10/15/2022)   PRAPARE - Administrator, Civil Service (Medical): No    Lack of Transportation (Non-Medical): No  Physical Activity: Inactive  (10/15/2022)   Exercise Vital Sign    Days of Exercise per Week: 0 days    Minutes of Exercise per Session: 0 min  Stress: No Stress Concern Present (10/15/2022)   [employer] of Occupational Health - Occupational Stress Questionnaire    Feeling of Stress : Not at all  Social Connections: Moderately Isolated (10/15/2022)   Social Connection and Isolation Panel [NHANES]    Frequency of Communication with Friends and Family: Once a week    Frequency of Social Gatherings with Friends and Family: More than three  times a week    Attends Religious Services: Never    Active Member of Clubs or Organizations: No    Attends Banker Meetings: Never    Marital Status: Married    Tobacco Counseling Counseling given: Not Answered   Clinical Intake:  Pre-visit preparation completed: Yes  Pain : No/denies pain     BMI - recorded: 24.69 Nutritional Status: BMI of 19-24  Normal Nutritional Risks: None Diabetes: Yes CBG done?: No Did pt. bring in CBG monitor from home?: No  How often do you need to have someone help you when you read instructions, pamphlets, or other written materials from your doctor or pharmacy?: 1 - Never  Diabetic?Nutrition Risk Assessment:  Has the patient had any N/V/D within the last 2 months?  No  Does the patient have any non-healing wounds?  No  Has the patient had any unintentional weight loss or weight gain?  No   Diabetes:  Is the patient diabetic?  Yes  If diabetic, was a CBG obtained today?  No  Did the patient bring in their glucometer from home?  No  How often do you monitor your CBG's? Every other day .   Financial Strains and Diabetes Management:  Are you having any financial strains with the device, your supplies or your medication? No .  Does the patient want to be seen by Chronic Care Management for management of their diabetes?  No  Would the patient like to be referred to a Nutritionist or for Diabetic Management?  No    Diabetic Exams:  Diabetic Eye Exam: Completed 04/23/22 Diabetic Foot Exam: Completed 07/02/22   Interpreter Needed?: No  Information entered by :: Lanier Ensign, LPN   Activities of Daily Living    10/15/2022    3:45 PM 05/03/2022    9:35 AM  In your present state of health, do you have any difficulty performing the following activities:  Hearing? 1 0  Comment need hearing aids   Vision? 0 0  Difficulty concentrating or making decisions? 0 0  Walking or climbing stairs? 0 0  Dressing or bathing? 0 0  Doing errands, shopping? 0   Preparing Food and eating ? N   Using the Toilet? N   In the past six months, have you accidently leaked urine? N   Do you have problems with loss of bowel control? N   Managing your Medications? N   Managing your Finances? N   Housekeeping or managing your Housekeeping? N     Patient Care Team: Shelva Majestic, MD as PCP - General (Family Medicine) [place], Illene Labrador, MD (Inactive) as Consulting Physician (Orthopedic Surgery) Elise Benne, MD as Consulting Physician (Ophthalmology) De Blanch, MD as Attending Physician (Gynecology) [facility], [facility] (Ophthalmology) Doylene Bode, NP as Nurse Practitioner (Gynecologic Oncology)  Indicate any recent Medical Services you may have received from other than [facility] providers in the past year (date may be approximate).     Assessment:   This is a routine wellness examination for Jamie [name].  Hearing/Vision screen Hearing Screening - Comments:: Pt HOH needs hearing aids  Vision Screening - Comments:: Pt follows up with Dr Emily Filbert for annual eye exams   Dietary issues and exercise activities discussed: Current Exercise Habits: The patient has a physically strenuous job, but has no regular exercise apart from work.   Goals Addressed             This Visit's Progress    Patient Stated  Continue to get to 125 lbs        Depression Screen     10/15/2022    3:43 PM 07/02/2022    2:22 PM 09/01/2021    2:38 PM 02/10/2021    3:28 PM 10/11/2020    3:48 PM 01/14/2020    4:19 PM 09/08/2019    2:56 PM  PHQ 2/9 Scores  PHQ - 2 Score 0 0 0 0 0 2 1  PHQ- 9 Score  5   2 5 6     Fall Risk    10/15/2022    3:45 PM 07/02/2022    2:22 PM 09/01/2021    2:40 PM 02/10/2021    3:27 PM 10/11/2020    3:47 PM  Fall Risk   Falls in the past year? 1 0 1 0 1  Number falls in past yr: 1 0 1 0 0  Injury with Fall? 1 0 1 1 0  Comment   skinned knees    Risk for fall due to : History of fall(s);Impaired vision No Fall Risks Impaired vision;Impaired balance/gait No Fall Risks   Follow up Falls prevention discussed Falls evaluation completed Falls prevention discussed Falls evaluation completed     FALL RISK PREVENTION PERTAINING TO THE HOME:  Any stairs in or around the home? Yes  If so, are there any without handrails? No  Home free of loose throw rugs in walkways, pet beds, electrical cords, etc? Yes  Adequate lighting in your home to reduce risk of falls? Yes   ASSISTIVE DEVICES UTILIZED TO PREVENT FALLS:  Life alert? No  Use of a cane, walker or w/c? No  Grab bars in the bathroom? No  Shower chair or bench in shower? No  Elevated toilet seat or a handicapped toilet? No   TIMED UP AND GO:  Was the test performed? No .   Cognitive Function:        10/15/2022    3:46 PM 09/01/2021    2:42 PM  6CIT Screen  What Year? 0 points 0 points  What month? 0 points 0 points  What time? 0 points 0 points  Count back from 20 0 points 0 points  Months in reverse 0 points 0 points  Repeat phrase 0 points 0 points  Total Score 0 points 0 points    Immunizations Immunization History  Administered Date(s) Administered   Fluad Quad(high Dose 65+) 01/14/2020   Influenza,inj,Quad PF,6+ Mos 03/19/2013, 03/27/2018   Influenza-Unspecified 01/06/2020   PFIZER(Purple Top)SARS-COV-2 Vaccination 08/13/2019, 09/07/2019   PPD Test 01/03/2021   Pneumococcal  Conjugate-13 08/25/2021   Pneumococcal Polysaccharide-23 04/28/2013   Tdap 07/26/2014   Zoster Recombinat (Shingrix) 10/16/2021    TDAP status: Up to date  Flu Vaccine status: Due, Education has been provided regarding the importance of this vaccine. Advised may receive this vaccine at local pharmacy or Health Dept. Aware to provide a copy of the vaccination record if obtained from local pharmacy or Health Dept. Verbalized acceptance and understanding.  Pneumococcal vaccine status: Up to date  Covid-19 vaccine status: Completed vaccines  Qualifies for Shingles Vaccine? Yes   Zostavax completed Yes   Shingrix Completed?: No.    Education has been provided regarding the importance of this vaccine. Patient has been advised to call insurance company to determine out of pocket expense if they have not yet received this vaccine. Advised may also receive vaccine at local pharmacy or Health Dept. Verbalized acceptance and understanding.  Screening Tests Health Maintenance  Topic Date  Due   COVID-19 Vaccine (3 - Pfizer risk series) 10/05/2019   MAMMOGRAM  09/30/2020   Zoster Vaccines- Shingrix (2 of 2) 12/11/2021   Colonoscopy  05/19/2022   Hepatitis C Screening  06/04/2109 (Originally 03/09/1974)   INFLUENZA VACCINE  12/06/2022   HEMOGLOBIN A1C  12/31/2022   OPHTHALMOLOGY EXAM  04/24/2023   Diabetic kidney evaluation - eGFR measurement  07/03/2023   Diabetic kidney evaluation - Urine ACR  07/03/2023   FOOT EXAM  07/03/2023   Medicare Annual Wellness (AWV)  10/15/2023   DTaP/Tdap/Td (2 - Td or Tdap) 07/25/2024   Pneumonia Vaccine 63+ Years old (3 of 3 - PPSV23 or PCV20) 08/26/2026   DEXA SCAN  Completed   HPV VACCINES  Aged Out    Health Maintenance  Health Maintenance Due  Topic Date Due   COVID-19 Vaccine (3 - Pfizer risk series) 10/05/2019   MAMMOGRAM  09/30/2020   Zoster Vaccines- Shingrix (2 of 2) 12/11/2021   Colonoscopy  05/19/2022    Colorectal cancer screening: Type of  screening: Colonoscopy. Completed 05/20/15. Repeat every 7 years pt undecided at this time to schedule   Mammogram status: Completed 09/2022 per pt . Repeat every year  Bone Density status: Completed 10/26/20. Results reflect: Bone density results: OSTEOPENIA. Repeat every 2 years.  Additional Screening:  Hepatitis C Screening: does qualify  Vision Screening: Recommended annual ophthalmology exams for early detection of glaucoma and other disorders of the eye. Is the patient up to date with their annual eye exam?  Yes  Who is the provider or what is the name of the office in which the patient attends annual eye exams? Dr Emily Filbert  If pt is not established with a provider, would they like to be referred to a provider to establish care? No .   Dental Screening: Recommended annual dental exams for proper oral hygiene  Community Resource Referral / Chronic Care Management: CRR required this visit?  No   CCM required this visit?  No      Plan:     I have personally reviewed and noted the following in the patient's chart:   Medical and social history Use of alcohol, tobacco or illicit drugs  Current medications and supplements including opioid prescriptions. Patient is not currently taking opioid prescriptions. Functional ability and status Nutritional status Physical activity Advanced directives List of other physicians Hospitalizations, surgeries, and ER visits in previous 12 months Vitals Screenings to include cognitive, depression, and falls Referrals and appointments  In addition, I have reviewed and discussed with patient certain preventive protocols, quality metrics, and best practice recommendations. A written personalized care plan for preventive services as well as general preventive health recommendations were provided to patient.     Marzella Schlein, LPN   1/61/0960   Nurse Notes: none

## 2022-11-02 ENCOUNTER — Encounter: Payer: Self-pay | Admitting: Family Medicine

## 2022-11-02 ENCOUNTER — Ambulatory Visit (INDEPENDENT_AMBULATORY_CARE_PROVIDER_SITE_OTHER): Payer: Medicare Other | Admitting: Family Medicine

## 2022-11-02 VITALS — BP 106/60 | HR 77 | Temp 97.6°F | Ht 62.0 in | Wt 135.2 lb

## 2022-11-02 DIAGNOSIS — I10 Essential (primary) hypertension: Secondary | ICD-10-CM | POA: Diagnosis not present

## 2022-11-02 DIAGNOSIS — E785 Hyperlipidemia, unspecified: Secondary | ICD-10-CM | POA: Diagnosis not present

## 2022-11-02 DIAGNOSIS — E1169 Type 2 diabetes mellitus with other specified complication: Secondary | ICD-10-CM | POA: Diagnosis not present

## 2022-11-02 DIAGNOSIS — E119 Type 2 diabetes mellitus without complications: Secondary | ICD-10-CM

## 2022-11-02 MED ORDER — OZEMPIC (0.25 OR 0.5 MG/DOSE) 2 MG/3ML ~~LOC~~ SOPN: 0.5000 mg | PEN_INJECTOR | SUBCUTANEOUS | 5 refills | Status: DC

## 2022-11-02 NOTE — Progress Notes (Signed)
Phone [phone] In person visit   Subjective:   Jamie [name] is a 67 y.o. year old very pleasant female patient who presents for/with See problem oriented charting Chief Complaint  Patient presents with   Hypertension   Hyperlipidemia   Diabetes    Past Medical History-  Patient Active Problem List   Diagnosis Date Noted   History of sepsis 11/01/2019    Priority: High   History of endometrial cancer 05/12/2019    Priority: High   History of ovarian cancer 03/27/2018    Priority: High   Diabetes mellitus, type 2 (HCC) 04/25/2007    Priority: High   Osteopenia 10/31/2020    Priority: Medium    CKD stage 3 due to type 2 diabetes mellitus (HCC) 10/12/2020    Priority: Medium    Lymphedema of leg 08/27/2011    Priority: Medium    Hyperlipidemia associated with type 2 diabetes mellitus (HCC) 01/27/2007    Priority: Medium    Essential hypertension 01/27/2007    Priority: Medium    Fibromyalgia 01/27/2007    Priority: Medium    Bunion of great toe 09/08/2019    Priority: Low   Rash 10/28/2018    Priority: Low   Abdominal pain 05/22/2012    Priority: Low   Nonspecific elevation of levels of transaminase or lactic acid dehydrogenase (LDH) 08/03/2010    Priority: Low   Allergic rhinitis 01/27/2007    Priority: Low   GERD 01/27/2007    Priority: Low   MENOPAUSE-RELATED VASOMOTOR SYMPTOMS, HOT FLASHES 01/27/2007    Priority: Low   Atypical chest pain 01/27/2007    Priority: Low    Medications- reviewed and updated Current Outpatient Medications  Medication Sig Dispense Refill   Semaglutide,0.25 or 0.5MG /DOS, (OZEMPIC, 0.25 OR 0.5 MG/DOSE,) 2 MG/3ML SOPN Inject 0.5 mg into the skin once a week. 3 mL 5   ALPRAZolam (XANAX) 0.5 MG tablet Take 1 tablet (0.5 mg total) by mouth at bedtime as needed for anxiety or sleep. 30 tablet 2   benazepril (LOTENSIN) 10 MG tablet TAKE 1 TABLET(10 MG) BY MOUTH DAILY 90 tablet 2   COLLAGEN PO Take by mouth.      cyclobenzaprine (FLEXERIL) 10 MG tablet Take 10 mg by mouth at bedtime.      glimepiride (AMARYL) 2 MG tablet TAKE 1 TABLET(2 MG) BY MOUTH DAILY 90 tablet 3   Multiple Vitamins-Minerals (MULTIVITAMIN WITH MINERALS) tablet Take 1 tablet by mouth daily.     ondansetron (ZOFRAN ODT) 4 MG disintegrating tablet Take 1 tablet (4 mg total) by mouth every 8 (eight) hours as needed for nausea or vomiting. 20 tablet 0   rosuvastatin (CRESTOR) 40 MG tablet TAKE 1 TABLET(40 MG) BY MOUTH DAILY 90 tablet 3   triamcinolone cream (KENALOG) 0.1 % APPLY TOPICALLY TO THE AFFECTED AREA TWICE DAILY FOR 10 DAYS. MAXIMUM AND THEN TAKE AT LEAST 10 DAY BREAK 30 g 3   No current facility-administered medications for this visit.     Objective:  BP 106/60   Pulse 77   Temp 97.6 F (36.4 C)   Ht 5\' 2"  (1.575 m)   Wt 135 lb 3.2 oz (61.3 kg)   LMP  (LMP Unknown)   SpO2 94%   BMI 24.73 kg/m  Gen: NAD, resting comfortably CV: RRR no murmurs rubs or gallops Lungs: CTAB no crackles, wheeze, rhonchi Ext: trace edema on right leg under compression stocking Skin: warm, dry     Assessment and Plan   #left  trigger thumb- plans to go back to hand surgeon -has a counterforce brace- seems to have some tennis elbow- she will try the brace . She can mention to hand surgeon when she goes back  # Diabetes S: Medication: amaryl 2 mg, ozempic 07/03/22 start- up to 0.5 mg about 8 doses - did not start right away- husband gives shot. Mild nausea on 3rd day after dose but usese anti nausea medicine and tolerates - nasuea on metformin even lower doses later  CBGs- 169 before breakfast - would have been 200 previously. Cut out cokes Exercise and diet-walking regularly and trying to be active in home, has cut back on unhealthy foods- diabetes education years ago. Weight down 8 lbs A/P: Diabetes appears to be improving- she is going to continue current medications and come back in 1 month for labs- so we can have 3 full months on  Ozempic before we decide on any adjustments- plus that will give her more time to adjust to current dose  #hypertension S: medication: benazepril 10 mg. Was out for a few months but was very cautious on foods Home readings #s: 120s or even 130s at home BP Readings from Last 3 Encounters:  11/02/22 106/60  07/02/22 120/70  05/03/22 (!) 150/70  A/P: stable- continue current medicines   # Anxiety S:Medication: alprazolam 0.5 mg - uses as needed for anxiety or lseep-last fill was February 2024- hasn't needed much lately A/P: doing well- continue current medications    #hyperlipidemia S: Medication: planned atorvastatin 40mg --> rosuvastatin 40mg  in 09/2019 with LDL over 100 A/P: Low Density Lipoprotein (LDL cholesterol) at goal. Triglyceride(s) were high but a1c has improved so may trend down  Recommended follow up:  1 month labs. Return in about 5 months (around 04/04/2023) for followup or sooner if needed.Schedule b4 you leave. Future Appointments  Date Time Provider Department Center  01/17/2023  4:00 PM Terri Piedra, DO [facility] None  10/21/2023  3:00 PM [facility] ANNUAL WELLNESS VISIT 1 [facility]    Lab/Order associations:   ICD-10-CM   1. Type 2 diabetes mellitus without complication, without long-term current use of insulin (HCC)  E11.9 Comprehensive metabolic panel    Hemoglobin A1c    2. Essential hypertension  I10     3. Hyperlipidemia associated with type 2 diabetes mellitus (HCC)  E11.69    E78.5       Meds ordered this encounter  Medications   Semaglutide,0.25 or 0.5MG /DOS, (OZEMPIC, 0.25 OR 0.5 MG/DOSE,) 2 MG/3ML SOPN    Sig: Inject 0.5 mg into the skin once a week.    Dispense:  3 mL    Refill:  5    Return precautions advised.  Tana Conch, MD

## 2022-11-02 NOTE — Patient Instructions (Addendum)
Health Maintenance Due  Topic Date Due   Zoster Vaccines- Shingrix (2 of 2)- consider getting final shot 12/11/2021   Colonoscopy  05/19/2022  Please call to schedule colonoscopy- follow up with letter you received  Glad you are doing so  well and hoping a1c improves. Consider increasing Ozempic if stomach settles more and a1c not controlled but hopeful for good looking a1c  Recommended follow up: Return in about 5 months (around 04/04/2023) for followup or sooner if needed.Schedule b4 you leave.  But in 1 month schedule labs to check a1c

## 2022-12-03 ENCOUNTER — Other Ambulatory Visit (INDEPENDENT_AMBULATORY_CARE_PROVIDER_SITE_OTHER): Payer: Medicare Other

## 2022-12-03 DIAGNOSIS — E119 Type 2 diabetes mellitus without complications: Secondary | ICD-10-CM | POA: Diagnosis not present

## 2022-12-03 LAB — COMPREHENSIVE METABOLIC PANEL
ALT: 27 U/L (ref 0–35)
AST: 23 U/L (ref 0–37)
Albumin: 4.2 g/dL (ref 3.5–5.2)
Alkaline Phosphatase: 73 U/L (ref 39–117)
BUN: 10 mg/dL (ref 6–23)
CO2: 25 mEq/L (ref 19–32)
Calcium: 9.3 mg/dL (ref 8.4–10.5)
Chloride: 107 mEq/L (ref 96–112)
Creatinine, Ser: 1.22 mg/dL — ABNORMAL HIGH (ref 0.40–1.20)
GFR: 45.85 mL/min — ABNORMAL LOW (ref >60.00)
Glucose, Bld: 156 mg/dL — ABNORMAL HIGH (ref 70–99)
Potassium: 4.1 mEq/L (ref 3.5–5.1)
Sodium: 139 mEq/L (ref 135–145)
Total Bilirubin: 0.7 mg/dL (ref 0.2–1.2)
Total Protein: 6.2 g/dL (ref 6.0–8.3)

## 2022-12-03 LAB — HEMOGLOBIN A1C: Hgb A1c MFr Bld: 6.6 % — ABNORMAL HIGH (ref 4.6–6.5)

## 2022-12-27 ENCOUNTER — Encounter: Payer: Self-pay | Admitting: Family Medicine

## 2023-01-08 ENCOUNTER — Other Ambulatory Visit: Payer: Self-pay | Admitting: Family Medicine

## 2023-01-09 ENCOUNTER — Ambulatory Visit: Payer: Medicare Other | Admitting: Dermatology

## 2023-01-17 ENCOUNTER — Ambulatory Visit: Payer: Medicare Other | Admitting: Dermatology

## 2023-02-06 ENCOUNTER — Other Ambulatory Visit: Payer: Self-pay | Admitting: Family Medicine

## 2023-02-24 ENCOUNTER — Emergency Department (HOSPITAL_BASED_OUTPATIENT_CLINIC_OR_DEPARTMENT_OTHER): Payer: Medicare Other

## 2023-02-24 ENCOUNTER — Encounter (HOSPITAL_BASED_OUTPATIENT_CLINIC_OR_DEPARTMENT_OTHER): Payer: Self-pay | Admitting: Emergency Medicine

## 2023-02-24 ENCOUNTER — Emergency Department (HOSPITAL_BASED_OUTPATIENT_CLINIC_OR_DEPARTMENT_OTHER)
Admission: EM | Admit: 2023-02-24 | Discharge: 2023-02-24 | Disposition: A | Discharge status: Home / Self Care | Payer: Medicare Other | Attending: Emergency Medicine | Admitting: Emergency Medicine

## 2023-02-24 DIAGNOSIS — M19042 Primary osteoarthritis, left hand: Secondary | ICD-10-CM | POA: Diagnosis not present

## 2023-02-24 DIAGNOSIS — S61432A Puncture wound without foreign body of left hand, initial encounter: Secondary | ICD-10-CM | POA: Diagnosis not present

## 2023-02-24 DIAGNOSIS — I129 Hypertensive chronic kidney disease with stage 1 through stage 4 chronic kidney disease, or unspecified chronic kidney disease: Secondary | ICD-10-CM | POA: Insufficient documentation

## 2023-02-24 DIAGNOSIS — W540XXA Bitten by dog, initial encounter: Secondary | ICD-10-CM | POA: Diagnosis not present

## 2023-02-24 DIAGNOSIS — Z79899 Other long term (current) drug therapy: Secondary | ICD-10-CM | POA: Insufficient documentation

## 2023-02-24 DIAGNOSIS — S6992XA Unspecified injury of left wrist, hand and finger(s), initial encounter: Secondary | ICD-10-CM | POA: Diagnosis not present

## 2023-02-24 DIAGNOSIS — S61412A Laceration without foreign body of left hand, initial encounter: Secondary | ICD-10-CM | POA: Diagnosis not present

## 2023-02-24 DIAGNOSIS — S61452A Open bite of left hand, initial encounter: Secondary | ICD-10-CM | POA: Diagnosis not present

## 2023-02-24 DIAGNOSIS — N189 Chronic kidney disease, unspecified: Secondary | ICD-10-CM | POA: Diagnosis not present

## 2023-02-24 MED ORDER — BACITRACIN ZINC 500 UNIT/GM EX OINT: TOPICAL_OINTMENT | Freq: Two times a day (BID) | CUTANEOUS | Status: DC

## 2023-02-24 MED ORDER — AMOXICILLIN-POT CLAVULANATE 875-125 MG PO TABS: 1.0000 | ORAL_TABLET | Freq: Two times a day (BID) | ORAL | 0 refills | Status: AC

## 2023-02-24 MED ORDER — LIDOCAINE HCL (PF) 1 % IJ SOLN
15.0000 mL | Freq: Once | INTRAMUSCULAR | Status: AC
  Administered 2023-02-24: 15 mL via INTRADERMAL
  Filled 2023-02-24: qty 15

## 2023-02-24 MED ORDER — LIDOCAINE-EPINEPHRINE-TETRACAINE (LET) TOPICAL GEL: 3.0000 mL | Freq: Once | TOPICAL | Status: DC

## 2023-02-24 NOTE — ED Provider Notes (Signed)
Jamie [name]   CSN: [id number] Arrival date & time: 02/24/23  1431     History  Chief Complaint  Patient presents with   Animal Bite    Jamie [name] is a 67 y.o. female with history of CKD, hypertension, presents with concern for dog bite to her left hand that occurred approximately 1 hour prior to arrival.  States this dog was known to her, up-to-date on vaccines.  Reports her last tetanus was within the last 5 years.  Denies any difficulty with moving her hand or fingers.  Reports baseline numbness due to carpal tunnel.   Animal Bite      Home Medications Prior to Admission medications   Medication Sig Start Date End Date Taking? Authorizing Provider  amoxicillin-clavulanate (AUGMENTIN) 875-125 MG tablet Take 1 tablet by mouth every 12 (twelve) hours for 7 days. 02/24/23 03/03/23 Yes Arabella Merles, PA-C  ALPRAZolam Prudy Feeler) 0.5 MG tablet Take 1 tablet (0.5 mg total) by mouth at bedtime as needed for anxiety or sleep. 07/02/22   Shelva Majestic, MD  benazepril (LOTENSIN) 10 MG tablet TAKE 1 TABLET(10 MG) BY MOUTH DAILY 09/06/22   Shelva Majestic, MD  COLLAGEN PO Take by mouth.    [provider]  cyclobenzaprine (FLEXERIL) 10 MG tablet Take 10 mg by mouth at bedtime.     [provider]  glimepiride (AMARYL) 2 MG tablet TAKE 1 TABLET(2 MG) BY MOUTH DAILY 02/06/23   Shelva Majestic, MD  Multiple Vitamins-Minerals (MULTIVITAMIN WITH MINERALS) tablet Take 1 tablet by mouth daily.    [provider]  ondansetron (ZOFRAN ODT) 4 MG disintegrating tablet Take 1 tablet (4 mg total) by mouth every 8 (eight) hours as needed for nausea or vomiting. 09/24/22   Shelva Majestic, MD  rosuvastatin (CRESTOR) 40 MG tablet TAKE 1 TABLET(40 MG) BY MOUTH DAILY 01/16/23   Shelva Majestic, MD  Semaglutide,0.25 or 0.5MG /DOS, (OZEMPIC, 0.25 OR 0.5 MG/DOSE,) 2 MG/3ML SOPN Inject 0.5 mg into the skin once a  week. 11/02/22   Shelva Majestic, MD  triamcinolone cream (KENALOG) 0.1 % APPLY TOPICALLY TO THE AFFECTED AREA TWICE DAILY FOR 10 DAYS. MAXIMUM AND THEN TAKE AT LEAST 10 DAY BREAK 12/07/21   Shelva Majestic, MD      Allergies    Metformin and Oxycodone-acetaminophen    Review of Systems   Review of Systems  Skin:  Positive for wound.    Physical Exam Updated Vital Signs BP (!) 175/76 (BP Location: Right Arm)   Pulse 100   Temp 98.3 F (36.8 C) (Oral)   Resp 18   LMP  (LMP Unknown)   SpO2 100%  Physical Exam Vitals and nursing [name] reviewed.  Constitutional:      Appearance: Normal appearance.  HENT:     Head: Atraumatic.  Pulmonary:     Effort: Pulmonary effort is normal.  Musculoskeletal:     Comments: Able to fully flex and extend at the left 1st through 5th MCPs, PIP, DIPs. Baseline sensation of the left 1st through 5th digits of the left hand, reports some numbness in the left third finger she reports is baseline with her carpal tunnel.  5/5 strength with resisted wrist flexion extension, resisted finger abduction, resisted opposition of thumb and index finger.  Skin:    Comments: 1 cm linear laceration to the thenar eminence of the left hand, two 2cm lacerations to the dorsum of the left hand over  the second metacarpal.  Two 1cm lacerations over the dorsum of the left hand over second metacarpal.  2 cm laceration to the left third digit over the proximal phalanx  Bleeding well-controlled upon arrival, no obvious foreign bodies or debris  Neurological:     General: No focal deficit present.     Mental Status: She is alert.  Psychiatric:        Mood and Affect: Mood normal.        Behavior: Behavior normal.            ED Results / Procedures / Treatments   Labs (all labs ordered are listed, but only abnormal results are displayed) Labs Reviewed - No data to display  EKG None  Radiology DG Hand Complete Left  Result Date: 02/24/2023 CLINICAL DATA:   Left hand injury, dog bite.  Puncture wounds. EXAM: LEFT HAND - COMPLETE 3+ VIEW COMPARISON:  None Available. FINDINGS: There is no evidence of fracture or dislocation. Mild osteoarthritis. There is edema at the 3rd-4th interspace. No radiopaque foreign body. IMPRESSION: 1. Soft tissue edema without fracture or radiopaque foreign body. 2. Mild osteoarthritis. Electronically Signed   By: Narda Rutherford M.D.   On: 02/24/2023 16:09    Procedures Irrigation and debridement  Date/Time: 02/24/2023 4:47 PM  Performed by: Arabella Merles, PA-C Authorized by: Arabella Merles, PA-C  Consent: Verbal consent obtained. Consent given by: patient Patient understanding: patient states understanding of the procedure being performed Imaging studies: imaging studies available Patient identity confirmed: verbally with patient Local anesthesia used: yes Anesthesia: local infiltration  Anesthesia: Local anesthesia used: yes Local Anesthetic: lidocaine 1% without epinephrine (7ml)  Sedation: Patient sedated: no  Patient tolerance: patient tolerated the procedure well with no immediate complications Comments: Left hand lacerations is irrigated with 2 L sterile saline       Medications Ordered in ED Medications  bacitracin ointment (has no administration in time range)  lidocaine (PF) (XYLOCAINE) 1 % injection 15 mL (15 mLs Intradermal Given 02/24/23 1553)    ED Course/ Medical Decision Making/ A&P                                 Medical Decision Making Amount and/or Complexity of Data Reviewed Radiology: ordered.  Risk OTC drugs. Prescription drug management.   67 y.o. female presents with concern for dog bite to her hand sustained an hour prior to arrival  Differential diagnosis includes but is not limited to dog bite, tendon injury, foreign body, cellulitis  ED Course:  Patient with 4 lacerations to the dorsum of her left hand by the second metacarpal, 1 laceration to the left  third digit, and 1 laceration to the palmar aspect of the left hand over the thenar eminence.  Bleeding well-controlled upon arrival.  No obvious foreign body.  The area was anesthetized with lidocaine and the wounds explored through full range of motion.  The area was cleaned with 2 L of sterile saline.  Patient with full range of motion of the left wrist, left 1st through 5th digits able to flex and extend at the MCP, PIP, DIP fully.  No concern for tendon injury at this time.  No new numbness of her left digits, no concern for nerve injury at this time.  X-rays without any concern for fracture, dislocation, foreign bodies.  Patient reports last tetanus was within the last 5 years.  Bacitracin was applied and sterile dressing applied.  Patient discharged with 7-day course of Augmentin.   Impression: Dog bite  Disposition:  The patient was discharged home with instructions to take full course of Augmentin.  Keep the area clean with soap and water.  Allow the area to heal over on its own. Return precautions given.   Imaging Studies ordered: I ordered imaging studies including x-ray left hand I independently visualized the imaging with scope of interpretation limited to determining acute life threatening conditions related to emergency care. Imaging showed no fractures, dislocations, foreign bodies I agree with the radiologist interpretation   External records from outside source obtained and reviewed including CMP from 12/03/2022 with GFR under 45   Co morbidities that complicate the patient evaluation  Hypertension, CKD              Final Clinical Impression(s) / ED Diagnoses Final diagnoses:  Dog bite, initial encounter    Rx / DC Orders ED Discharge Orders          Ordered    amoxicillin-clavulanate (AUGMENTIN) 875-125 MG tablet  Every 12 hours        02/24/23 1641              Arabella Merles, PA-C 02/24/23 1647    Glyn Ade, MD 02/24/23  2016

## 2023-02-24 NOTE — ED Triage Notes (Signed)
Left hand, dog bite. Puncture wounds. Bleeding controlled. Personal dog, shots up to dat. Tetanus utd.  Happened around 2pm

## 2023-02-24 NOTE — Discharge Instructions (Addendum)
You may gently clean the area around your laceration as needed with soap and water. Place antibiotic ointment such as bacitracin or neosporin over your laceration after cleaning the area.  This area will heal up on its own.  Keep the laceration covered with sterile gauze as shown here if you are doing an activity in which it may get dirty. You may pick these supplies up at any drugstore.  Do not submerge your laceration in water (no baths, swimming) until it is fully healed. You may shower.   You have been prescribed Augmentin. Take this antibiotic 2 times a day for the next 7 days. Take the full course of your antibiotic even if you start feeling better. Antibiotics may cause you to have diarrhea.  Return to the ER should you develop fever, chills, pus drainage from your wound, redness around your wound, or red streaks in your skin.

## 2023-02-28 ENCOUNTER — Encounter: Payer: Self-pay | Admitting: Family Medicine

## 2023-03-01 ENCOUNTER — Ambulatory Visit (INDEPENDENT_AMBULATORY_CARE_PROVIDER_SITE_OTHER): Payer: Medicare Other | Admitting: Family

## 2023-03-01 VITALS — BP 122/73 | HR 84 | Temp 97.8°F | Ht 62.0 in | Wt 128.0 lb

## 2023-03-01 DIAGNOSIS — S61452A Open bite of left hand, initial encounter: Secondary | ICD-10-CM | POA: Diagnosis not present

## 2023-03-01 DIAGNOSIS — W540XXA Bitten by dog, initial encounter: Secondary | ICD-10-CM

## 2023-03-01 NOTE — Progress Notes (Signed)
Patient ID: Jamie [name], female    DOB: 27-Jun-1955, 67 y.o.   MRN: [id number]  Chief Complaint  Patient presents with   Follow-up    Pt was seen In ED on 10/20 for a dog bite on left hand. Pt states it was her own dog the bit her. Left hand is still painful.    Discussed the use of AI scribe software for clinical note transcription with the patient, who gave verbal consent to proceed.  History of Present Illness   The patient presents with multiple wounds on her left hand from a dog bite. The wounds are still tender, swollen and one is still oozing. She has been applying a bacterial ointment and washing with antibacterial soap daily. She has been keeping the wounds covered when leaving the house, but leaves them uncovered at home to prevent them from becoming too moist. She is currently on a course of antibiotics, which she will finish in a few days. The patient also reports that one finger is still not able to fully extend, possibly due to swelling. She has not been icing her hand. 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      Assessment & Plan:    Dog Bite w/Wounds - Several wounds on dorsal side left hand with some oozing and swelling. No signs of infection. Patient is currently on antibiotics and applying antibacterial ointment. -Continue oral antibiotic until completed. -Clean wounds daily with antibacterial soap. -Apply just a thin layer of antibacterial ointment once daily and cover wounds if leaving the house or if there is drainage. -Use ice and elevation to manage swelling. -Advised on s/s of infection and when to call the office. -Encourage gentle movement of affected fingers to maintain mobility. -Provide work note to extend leave until 03/11/2023 due to inability to submerge hands in water at work.     Subjective:    Outpatient Medications Prior to Visit   Medication Sig Dispense Refill   ALPRAZolam (XANAX) 0.5 MG tablet Take 1 tablet (0.5 mg total) by mouth at bedtime as needed for anxiety or sleep. 30 tablet 2   amoxicillin-clavulanate (AUGMENTIN) 875-125 MG tablet Take 1 tablet by mouth every 12 (twelve) hours for 7 days. 14 tablet 0   benazepril (LOTENSIN) 10 MG tablet TAKE 1 TABLET(10 MG) BY MOUTH DAILY 90 tablet 2   COLLAGEN PO Take by mouth.     cyclobenzaprine (FLEXERIL) 10 MG tablet Take 10 mg by mouth at bedtime.      glimepiride (AMARYL) 2 MG tablet TAKE 1 TABLET(2 MG) BY MOUTH DAILY 90 tablet 3   Multiple Vitamins-Minerals (MULTIVITAMIN WITH MINERALS) tablet Take 1 tablet by mouth daily.     ondansetron (ZOFRAN ODT) 4 MG disintegrating tablet Take 1 tablet (4 mg total) by mouth every 8 (eight) hours as needed for nausea or vomiting. 20 tablet 0   rosuvastatin (CRESTOR) 40 MG tablet TAKE 1 TABLET(40 MG) BY MOUTH DAILY 90 tablet 3   Semaglutide,0.25 or 0.5MG /DOS, (OZEMPIC, 0.25 OR 0.5 MG/DOSE,) 2 MG/3ML SOPN Inject 0.5 mg into the skin once a week. 3 mL 5   triamcinolone cream (KENALOG) 0.1 % APPLY TOPICALLY TO THE AFFECTED AREA TWICE DAILY FOR 10 DAYS. MAXIMUM AND THEN TAKE AT LEAST 10 DAY BREAK 30 g 3   No facility-administered medications prior to visit.   Past Medical History:  Diagnosis Date   Allergy    Diabetes mellitus    ORAL MEDS-NO INSULIN   Endometrial cancer (HCC) 03/2011   03/2011   GERD (gastroesophageal reflux disease)    Hiatal hernia    History of cervical dysplasia    Hyperlipidemia    Hypertension    Malignant neoplasm of body of uterus (HCC) 03/21/2011   S.p hysterectomy   Ovarian cancer (HCC) 03/2011   03/2011   PONV (postoperative nausea and vomiting)    ONLY AFTER CERVICAL FUSION--NO PROB WITH OTHER SURGERIES   Renal disorder    Ulcer    history of gastritis   Past Surgical History:  Procedure Laterality Date   ACHILLES TENDON SURGERY     MULTIPLE SURGERIES BECAUSE OF STAPH INFECTION   CARPAL  TUNNEL RELEASE Right 05/03/2022   Procedure: RIGHT CARPAL TUNNEL RELEASE;  Surgeon: Betha Loa, MD;  Location: [facility];  Service: Orthopedics;  Laterality: Right;  Bier block   CERVICAL FUSION  09/05/2003   EYE SURGERY Left    macular whole   GYNECOLOGIC CRYOSURGERY  02/05/2003   LUMBAR FUSION  11/05/1998   LYMPH NODE DISSECTION  05/07/2010   MINOR CARPAL TUNNEL Right    PARS PLANA VITRECTOMY W/ REPAIR OF MACULAR HOLE Left    november 2023   STERIOD INJECTION Left 05/03/2022   Procedure: STEROID INJECTION LEFT WRIST;  Surgeon: Betha Loa, MD;  Location: [facility];  Service: Orthopedics;  Laterality: Left;   TOTAL VAGINAL HYSTERECTOMY  03/29/2011   Allergies  Allergen Reactions   Metformin Nausea And Vomiting   Oxycodone-Acetaminophen Itching, Swelling and Other (See Comments)    Tylox- Face became swollen      Objective:    Physical Exam Vitals and nursing note reviewed.  Constitutional:      Appearance: Normal appearance.  Cardiovascular:     Rate and Rhythm: Normal rate and regular rhythm.  Pulmonary:     Effort: Pulmonary effort is normal.     Breath sounds: Normal breath sounds.  Musculoskeletal:     Left hand: Swelling and tenderness present. Decreased range of motion.  Skin:    General: Skin is warm and dry.     Findings: Wound (several areas on dorsal left hand, one closest to thumb and forefinger largest approx 2.5cm in diameter, 0.2cm depth, serosanguinous drg, several other smaller areas, scant serosanguinous drg, shallow) present. No erythema.  Neurological:     Mental Status: She is alert.  Psychiatric:        Mood and Affect: Mood normal.        Behavior: Behavior normal.    BP 122/73 (BP Location: Left Arm, Patient Position: Sitting, Cuff Size: Normal)   Pulse 84   Temp 97.8 F (36.6 C)   Ht 5\' 2"  (1.575 m)   Wt 128 lb (58.1 kg)   LMP  (LMP Unknown)   SpO2 99%   BMI 23.41 kg/m  Wt Readings from Last 3  Encounters:  03/01/23 128 lb (58.1 kg)  11/02/22 135 lb 3.2 oz (61.3 kg)  10/15/22 135 lb (61.2 kg)       Dulce Sellar, NP

## 2023-03-05 NOTE — Telephone Encounter (Signed)
LVM to schedule ED FU with any provider

## 2023-04-01 ENCOUNTER — Ambulatory Visit: Payer: Medicare Other | Admitting: Family Medicine

## 2023-04-24 ENCOUNTER — Telehealth: Payer: Self-pay

## 2023-04-24 ENCOUNTER — Ambulatory Visit: Payer: Medicare Other | Admitting: Family Medicine

## 2023-04-24 ENCOUNTER — Encounter: Payer: Self-pay | Admitting: Family Medicine

## 2023-04-24 VITALS — BP 108/68 | HR 75 | Temp 97.6°F | Ht 62.0 in | Wt 133.2 lb

## 2023-04-24 DIAGNOSIS — E785 Hyperlipidemia, unspecified: Secondary | ICD-10-CM

## 2023-04-24 DIAGNOSIS — Z23 Encounter for immunization: Secondary | ICD-10-CM

## 2023-04-24 DIAGNOSIS — Z7985 Long-term (current) use of injectable non-insulin antidiabetic drugs: Secondary | ICD-10-CM

## 2023-04-24 DIAGNOSIS — E119 Type 2 diabetes mellitus without complications: Secondary | ICD-10-CM

## 2023-04-24 DIAGNOSIS — E1169 Type 2 diabetes mellitus with other specified complication: Secondary | ICD-10-CM

## 2023-04-24 DIAGNOSIS — I10 Essential (primary) hypertension: Secondary | ICD-10-CM | POA: Diagnosis not present

## 2023-04-24 LAB — COMPREHENSIVE METABOLIC PANEL
ALT: 21 U/L (ref 0–35)
AST: 19 U/L (ref 0–37)
Albumin: 4.3 g/dL (ref 3.5–5.2)
Alkaline Phosphatase: 73 U/L (ref 39–117)
BUN: 17 mg/dL (ref 6–23)
CO2: 28 meq/L (ref 19–32)
Calcium: 9.5 mg/dL (ref 8.4–10.5)
Chloride: 105 meq/L (ref 96–112)
Creatinine, Ser: 1.02 mg/dL (ref 0.40–1.20)
GFR: 56.68 mL/min — ABNORMAL LOW (ref >60.00)
Glucose, Bld: 119 mg/dL — ABNORMAL HIGH (ref 70–99)
Potassium: 4.2 meq/L (ref 3.5–5.1)
Sodium: 139 meq/L (ref 135–145)
Total Bilirubin: 0.6 mg/dL (ref 0.2–1.2)
Total Protein: 6.3 g/dL (ref 6.0–8.3)

## 2023-04-24 LAB — HEMOGLOBIN A1C: Hgb A1c MFr Bld: 7.5 % — ABNORMAL HIGH (ref 4.6–6.5)

## 2023-04-24 MED ORDER — BENAZEPRIL HCL 5 MG PO TABS: ORAL_TABLET | ORAL | 3 refills | Status: DC

## 2023-04-24 MED ORDER — BLOOD GLUCOSE MONITORING SUPPL DEVI: 1.0000 | Freq: Three times a day (TID) | 0 refills | Status: AC

## 2023-04-24 MED ORDER — ONDANSETRON 4 MG PO TBDP: 4.0000 mg | ORAL_TABLET | Freq: Three times a day (TID) | ORAL | 2 refills | Status: DC | PRN

## 2023-04-24 MED ORDER — LANCETS MISC. MISC: 1.0000 | Freq: Three times a day (TID) | 3 refills | Status: AC

## 2023-04-24 MED ORDER — ALPRAZOLAM 0.5 MG PO TABS: 0.5000 mg | ORAL_TABLET | Freq: Every evening | ORAL | 2 refills | Status: AC | PRN

## 2023-04-24 MED ORDER — BLOOD GLUCOSE TEST VI STRP: 1.0000 | ORAL_STRIP | Freq: Two times a day (BID) | 3 refills | Status: AC

## 2023-04-24 MED ORDER — LANCET DEVICE MISC: 1.0000 | Freq: Three times a day (TID) | 0 refills | Status: AC

## 2023-04-24 NOTE — Patient Instructions (Addendum)
Get diabetic eye exam scheduled when able  blood pressure well controlled perhaps mildly over controlled- was 106 last visit so we opted to decrease to 5 mg dose of benazepril as long as home readings <130/80  [facility] contact Please call to schedule visit and/or procedure Address: [address] [place], [place], [place] [address] Phone: [phone]  Please stop by lab before you go If you have mychart- we will send your results within 3 business days of [place] receiving them.  If you do not have mychart- we will call you about results within 5 business days of [place] receiving them.  *please also note that you will see labs on mychart as soon as they post. I will later go in and write notes on them- will say "notes from Dr. Durene Cal"   Recommended follow up: Return in about 4 months (around 08/23/2023) for physical or sooner if needed.Schedule b4 you leave.

## 2023-04-24 NOTE — Progress Notes (Signed)
Phone [phone] In person visit   Subjective:   Jamie [name] is a 67 y.o. year old very pleasant female patient who presents for/with See problem oriented charting Chief Complaint  Patient presents with   Medical Management of Chronic Issues    Pt does not wish to have colonoscopy done   Diabetes   Hypertension   Hyperlipidemia   Past Medical History-  Patient Active Problem List   Diagnosis Date Noted   History of sepsis 11/01/2019    Priority: High   History of endometrial cancer 05/12/2019    Priority: High   History of ovarian cancer 03/27/2018    Priority: High   Diabetes mellitus, type 2 (HCC) 04/25/2007    Priority: High   Osteopenia 10/31/2020    Priority: Medium    CKD stage 3 due to type 2 diabetes mellitus (HCC) 10/12/2020    Priority: Medium    Lymphedema of leg 08/27/2011    Priority: Medium    Hyperlipidemia associated with type 2 diabetes mellitus (HCC) 01/27/2007    Priority: Medium    Essential hypertension 01/27/2007    Priority: Medium    Fibromyalgia 01/27/2007    Priority: Medium    Bunion of great toe 09/08/2019    Priority: Low   Rash 10/28/2018    Priority: Low   Abdominal pain 05/22/2012    Priority: Low   Nonspecific elevation of levels of transaminase or lactic acid dehydrogenase (LDH) 08/03/2010    Priority: Low   Allergic rhinitis 01/27/2007    Priority: Low   GERD 01/27/2007    Priority: Low   MENOPAUSE-RELATED VASOMOTOR SYMPTOMS, HOT FLASHES 01/27/2007    Priority: Low   Atypical chest pain 01/27/2007    Priority: Low    Medications- reviewed and updated Current Outpatient Medications  Medication Sig Dispense Refill   Blood Glucose Monitoring Suppl DEVI 1 each by Does not apply route in the morning, at noon, and at bedtime. May substitute to any manufacturer covered by patient's insurance. 1 each 0   COLLAGEN PO Take by mouth.     cyclobenzaprine (FLEXERIL) 10 MG tablet Take 10 mg by mouth at bedtime.       glimepiride (AMARYL) 2 MG tablet TAKE 1 TABLET(2 MG) BY MOUTH DAILY 90 tablet 3   Glucose Blood (BLOOD GLUCOSE TEST STRIPS) STRP 1 each by In Vitro route in the morning and at bedtime. May substitute to any manufacturer covered by patient's insurance. 200 strip 3   Lancet Device MISC 1 each by Does not apply route in the morning, at noon, and at bedtime. May substitute to any manufacturer covered by patient's insurance. 1 each 0   Lancets Misc. MISC 1 each by Does not apply route in the morning, at noon, and at bedtime. May substitute to any manufacturer covered by patient's insurance. 200 each 3   Multiple Vitamins-Minerals (MULTIVITAMIN WITH MINERALS) tablet Take 1 tablet by mouth daily.     rosuvastatin (CRESTOR) 40 MG tablet TAKE 1 TABLET(40 MG) BY MOUTH DAILY 90 tablet 3   triamcinolone cream (KENALOG) 0.1 % APPLY TOPICALLY TO THE AFFECTED AREA TWICE DAILY FOR 10 DAYS. MAXIMUM AND THEN TAKE AT LEAST 10 DAY BREAK 30 g 3   ALPRAZolam (XANAX) 0.5 MG tablet Take 1 tablet (0.5 mg total) by mouth at bedtime as needed for anxiety or sleep. 30 tablet 2   benazepril (LOTENSIN) 5 MG tablet TAKE 1 TABLET(5 MG) BY MOUTH DAILY 90 tablet 3   ondansetron (ZOFRAN ODT) 4  MG disintegrating tablet Take 1 tablet (4 mg total) by mouth every 8 (eight) hours as needed for nausea or vomiting. 20 tablet 2   No current facility-administered medications for this visit.     Objective:  BP 108/68   Pulse 75   Temp 97.6 F (36.4 C)   Ht 5\' 2"  (1.575 m)   Wt 133 lb 3.2 oz (60.4 kg)   LMP  (LMP Unknown)   SpO2 98%   BMI 24.36 kg/m  Gen: NAD, resting comfortably CV: RRR no murmurs rubs or gallops Lungs: CTAB no crackles, wheeze, rhonchi Abdomen: soft/nontender/nondistended/normal bowel sounds. No rebound or guarding.  Ext: no edema Skin: warm, dry healed scar of hand from prior dog bite    Assessment and Plan   #social update- had to put dog down after bites- tough on her but she knows it was the right thing  to do  #history of colon polyps agrees to call for colonoscopy- reports initially wanted to hold off as "scared" but discussed this could prevent major issues  # Diabetes S: Medication: amaryl 2 mg - had to stop Ozempic- nausea started lasting a week after  - nausea on metformin even lower doses. Has cut down on sugar and still getting some nausea and vomiting at times. Had some before Ozempic but has been worse- needs Zofran. Sugar and blood pressure have not been off with this CBGs- no recent checks Exercise and diet- hard with working 2 jobs Lab Results  Component Value Date   HGBA1C 6.6 (H) 12/03/2022   HGBA1C 8.3 (H) 07/02/2022   HGBA1C 8.4 (H) 12/29/2021  A/P: hopefully stable- update a1c today. Continue current meds for now  -does have nausea issues and offered gastroenterology refer - she wants to hold off for now - improving off Ozempic but did have mild issues even prior to that  #hypertension S: medication: benazepril 10 mg -home cuff as low as 90s BP Readings from Last 3 Encounters:  04/24/23 108/68  03/01/23 122/73  02/24/23 (!) 140/72  A/P: blood pressure well controlled perhaps mildly over controlled- was 106 last visit so we opted to decrease to 5 mg dose of benazepril as long as home readings <130/80  # Anxiety S:Medication: alprazolam 0.5 mg - uses as needed for anxiety or lseep- sparing refills- last in february  A/P: issues mainly at night- does well with sparing alprazolam- refill today   #hyperlipidemia S: Medication: planned atorvastatin 40mg --> rosuvastatin 40mg  in 09/2019 with LDL over 100 Lab Results  Component Value Date   CHOL 113 07/02/2022   HDL 39.10 07/02/2022   LDLCALC  02/10/2021     Comment:     . LDL cholesterol not calculated. Triglyceride levels greater than 400 mg/dL invalidate calculated LDL results. . Reference range: <100 . Desirable range <100 mg/dL for primary prevention;   <70 mg/dL for patients with CHD or diabetic patients   with > or = 2 CHD risk factors. Marland Kitchen LDL-C is now calculated using the Martin-Hopkins  calculation, which is a validated novel method providing  better accuracy than the Friedewald equation in the  estimation of LDL-C.  Horald Pollen et al. Lenox Ahr. 4098;119(14): 2061-2068  (http://education.[facility].com/faq/FAQ164)    LDLDIRECT 40.0 07/02/2022   TRIG (H) 07/02/2022    407.0 Triglyceride is over 400; calculations on Lipids are invalid.   CHOLHDL 3 07/02/2022  A/P: too soon for repeat- check next visit- hope triglyceride(s) will come down with better sugar control  # Low Bone density (formerly osteopenia)-  wants to wait until next time to update  DEXA: October 26, 2020 was last check  Recommended follow up: Return in about 4 months (around 08/23/2023) for physical or sooner if needed.Schedule b4 you leave. Future Appointments  Date Time Provider Department Center  10/21/2023  3:00 PM [facility] ANNUAL WELLNESS VISIT 1 [facility]    Lab/Order associations:   ICD-10-CM   1. Type 2 diabetes mellitus without complication, without long-term current use of insulin (HCC)  E11.9 Comprehensive metabolic panel    Hemoglobin A1c    2. Essential hypertension  I10     3. Hyperlipidemia associated with type 2 diabetes mellitus (HCC)  E11.69    E78.5     4. Need for influenza vaccination  Z23 Flu Vaccine Trivalent High Dose (Fluad)      Meds ordered this encounter  Medications   ondansetron (ZOFRAN ODT) 4 MG disintegrating tablet    Sig: Take 1 tablet (4 mg total) by mouth every 8 (eight) hours as needed for nausea or vomiting.    Dispense:  20 tablet    Refill:  2   ALPRAZolam (XANAX) 0.5 MG tablet    Sig: Take 1 tablet (0.5 mg total) by mouth at bedtime as needed for anxiety or sleep.    Dispense:  30 tablet    Refill:  2   Blood Glucose Monitoring Suppl DEVI    Sig: 1 each by Does not apply route in the morning, at noon, and at bedtime. May substitute to any manufacturer covered by  patient's insurance.    Dispense:  1 each    Refill:  0   Glucose Blood (BLOOD GLUCOSE TEST STRIPS) STRP    Sig: 1 each by In Vitro route in the morning and at bedtime. May substitute to any manufacturer covered by patient's insurance.    Dispense:  200 strip    Refill:  3   Lancet Device MISC    Sig: 1 each by Does not apply route in the morning, at noon, and at bedtime. May substitute to any manufacturer covered by patient's insurance.    Dispense:  1 each    Refill:  0   Lancets Misc. MISC    Sig: 1 each by Does not apply route in the morning, at noon, and at bedtime. May substitute to any manufacturer covered by patient's insurance.    Dispense:  200 each    Refill:  3   benazepril (LOTENSIN) 5 MG tablet    Sig: TAKE 1 TABLET(5 MG) BY MOUTH DAILY    Dispense:  90 tablet    Refill:  3    Return precautions advised.  Tana Conch, MD

## 2023-04-24 NOTE — Telephone Encounter (Signed)
Copied from CRM [id number]. Topic: Clinical - Prescription Issue >> Apr 24, 2023  1:20 PM Melissa C wrote: Reason for CRM: Luisa Hart from patient's pharmacy called looking for clarification on dosing of Lancet strips that were ordered today  Contacted pharmacy and advised clarification of how many times daily pt checking glucose. Advised 3 times daily. Pharmacy verbalized understanding

## 2023-05-07 ENCOUNTER — Telehealth: Payer: Self-pay

## 2023-05-07 NOTE — Telephone Encounter (Addendum)
 Left voice message asking patient to give office a call back for updated response from Dr. Katrinka.   ----- Message from Garnette Katrinka sent at 05/02/2023  1:27 PM EST ----- Notes from Dr. Katrinka:  I completely understand while she would not want to take the Jardiance -please encourage her to be more aggressive about healthy eating/regular exercise

## 2023-06-02 ENCOUNTER — Encounter: Payer: Self-pay | Admitting: Family Medicine

## 2023-06-03 ENCOUNTER — Other Ambulatory Visit: Payer: Self-pay

## 2023-06-03 MED ORDER — EMPAGLIFLOZIN 10 MG PO TABS: 10.0000 mg | ORAL_TABLET | Freq: Every day | ORAL | 11 refills | Status: AC

## 2023-07-12 ENCOUNTER — Other Ambulatory Visit: Payer: Self-pay | Admitting: Family Medicine

## 2023-08-07 DIAGNOSIS — J069 Acute upper respiratory infection, unspecified: Secondary | ICD-10-CM | POA: Diagnosis not present

## 2023-09-04 DIAGNOSIS — H52203 Unspecified astigmatism, bilateral: Secondary | ICD-10-CM | POA: Diagnosis not present

## 2023-09-04 DIAGNOSIS — H2513 Age-related nuclear cataract, bilateral: Secondary | ICD-10-CM | POA: Diagnosis not present

## 2023-09-04 DIAGNOSIS — E119 Type 2 diabetes mellitus without complications: Secondary | ICD-10-CM | POA: Diagnosis not present

## 2023-09-04 DIAGNOSIS — H31002 Unspecified chorioretinal scars, left eye: Secondary | ICD-10-CM | POA: Diagnosis not present

## 2023-09-04 LAB — HM DIABETES EYE EXAM

## 2023-09-27 ENCOUNTER — Encounter: Payer: Self-pay | Admitting: Family Medicine

## 2023-10-01 DIAGNOSIS — Z1231 Encounter for screening mammogram for malignant neoplasm of breast: Secondary | ICD-10-CM | POA: Diagnosis not present

## 2023-10-16 ENCOUNTER — Ambulatory Visit (INDEPENDENT_AMBULATORY_CARE_PROVIDER_SITE_OTHER)

## 2023-10-16 ENCOUNTER — Ambulatory Visit: Payer: Self-pay | Admitting: Podiatry

## 2023-10-16 ENCOUNTER — Encounter: Payer: Self-pay | Admitting: Podiatry

## 2023-10-16 DIAGNOSIS — M778 Other enthesopathies, not elsewhere classified: Secondary | ICD-10-CM

## 2023-10-16 DIAGNOSIS — M2011 Hallux valgus (acquired), right foot: Secondary | ICD-10-CM

## 2023-10-16 DIAGNOSIS — B353 Tinea pedis: Secondary | ICD-10-CM

## 2023-10-16 MED ORDER — MELOXICAM 15 MG PO TABS: 15.0000 mg | ORAL_TABLET | Freq: Every day | ORAL | 1 refills | Status: AC

## 2023-10-16 MED ORDER — CLOTRIMAZOLE-BETAMETHASONE 1-0.05 % EX CREA: 1.0000 | TOPICAL_CREAM | Freq: Every day | CUTANEOUS | 2 refills | Status: AC

## 2023-10-20 NOTE — Progress Notes (Signed)
 Chief Complaint  Patient presents with   Foot Pain    RM#9 Right foot pain history of multiple surgeries today presents with right great toe pain and bunion pain.Patient wears compression on right leg for lymphoedema.    Subjective: 68 y.o. Jamie [name] presents today as a new patient for evaluation of a symptomatic bunion to the right foot.  Progressive onset over the past several years.  She has tried different conservative modalities including wide fitting shoes with no improvement.  Past Medical History:  Diagnosis Date   Allergy    Diabetes mellitus    ORAL MEDS-NO INSULIN    Endometrial cancer (HCC) 03/2011   03/2011   GERD (gastroesophageal reflux disease)    Hiatal hernia    History of cervical dysplasia    Hyperlipidemia    Hypertension    Malignant neoplasm of body of uterus (HCC) 03/21/2011   S.p hysterectomy   Ovarian cancer (HCC) 03/2011   03/2011   PONV (postoperative nausea and vomiting)    ONLY AFTER CERVICAL FUSION--NO PROB WITH OTHER SURGERIES   Renal disorder    Ulcer    history of gastritis    Past Surgical History:  Procedure Laterality Date   ACHILLES TENDON SURGERY     MULTIPLE SURGERIES BECAUSE OF STAPH INFECTION   CARPAL TUNNEL RELEASE Right 05/03/2022   Procedure: RIGHT CARPAL TUNNEL RELEASE;  Surgeon: Brunilda Capra, MD;  Location: [facility];  Service: Orthopedics;  Laterality: Right;  Bier block   CERVICAL FUSION  09/05/2003   EYE SURGERY Left    macular whole   GYNECOLOGIC CRYOSURGERY  02/05/2003   LUMBAR FUSION  11/05/1998   LYMPH NODE DISSECTION  05/07/2010   MINOR CARPAL TUNNEL Right    PARS PLANA VITRECTOMY W/ REPAIR OF MACULAR HOLE Left    november 2023   STERIOD INJECTION Left 05/03/2022   Procedure: STEROID INJECTION LEFT WRIST;  Surgeon: Brunilda Capra, MD;  Location: [facility];  Service: Orthopedics;  Laterality: Left;   TOTAL VAGINAL HYSTERECTOMY  03/29/2011    Allergies  Allergen Reactions    Metformin  Nausea And Vomiting   Oxycodone-Acetaminophen  Itching, Swelling and Other (See Comments)    Tylox- Face became swollen     Objective: Physical Exam General: The patient is alert and oriented x3 in no acute distress.  Dermatology: Skin is cool, dry and supple bilateral lower extremities. Negative for open lesions or macerations.  There is some diffuse hyperkeratosis of skin with intermittent pruritus consistent with chronic tinea pedis  Vascular: Palpable pedal pulses bilaterally. No edema or erythema noted. Capillary refill within normal limits.  Neurological: Grossly intact via light touch  Musculoskeletal Exam: Clinical evidence of bunion deformity noted to the respective foot. There is moderate pain on palpation range of motion of the first MPJ. Lateral deviation of the hallux noted consistent with hallux abductovalgus.  Radiographic Exam RT foot 10/16/2023: Normal osseous mineralization.  No acute fractures identified.  Increased intermetatarsal angle with medial deviation of the first metatarsal on AP view  Assessment: 1.  Hallux valgus right 2.  First MTP capsulitis right 3.  Intermittent tinea pedis   Plan of Care:  -Patient was evaluated. X-Rays reviewed. -Prescription for Lotrisone  cream apply twice daily -Prescription for meloxicam  15 mg daily -Today we discussed conservative versus surgical management for bunion deformity.  Explained the pathology and etiology of bunions.  Conservatively recommend wide fitting shoes with arch supports that support the foot and offload pressure from the forefoot -Surgically recommend  distal bunion osteotomy.  The procedure was explained in detail to the patient.  Risk benefits advantages and disadvantages were also explained in great length in detail.  All patient questions were answered.  -For now the patient will continue to pursue conservative treatment and management.  Return to clinic PRN   Dot Gazella, DPM [facility]  Dr. Dot Gazella, DPM    [address] [place], [place] [address]                Office [phone]  Fax [phone]

## 2023-10-21 ENCOUNTER — Ambulatory Visit: Payer: Medicare Other

## 2023-10-21 ENCOUNTER — Telehealth: Payer: Self-pay

## 2023-10-21 NOTE — Telephone Encounter (Signed)
 Unsuccessful attempts to reach patient on preferred number listed in notes for scheduled AWV. Left message on voicemail okay to reschedule.

## 2023-10-31 ENCOUNTER — Ambulatory Visit (INDEPENDENT_AMBULATORY_CARE_PROVIDER_SITE_OTHER)

## 2023-10-31 VITALS — Ht 62.0 in | Wt 133.0 lb

## 2023-10-31 DIAGNOSIS — E785 Hyperlipidemia, unspecified: Secondary | ICD-10-CM

## 2023-10-31 DIAGNOSIS — E1169 Type 2 diabetes mellitus with other specified complication: Secondary | ICD-10-CM

## 2023-10-31 DIAGNOSIS — Z1211 Encounter for screening for malignant neoplasm of colon: Secondary | ICD-10-CM | POA: Diagnosis not present

## 2023-10-31 DIAGNOSIS — Z0000 Encounter for general adult medical examination without abnormal findings: Secondary | ICD-10-CM | POA: Diagnosis not present

## 2023-10-31 NOTE — Patient Instructions (Signed)
 Jamie [name] , Thank you for taking time out of your busy schedule to complete your Annual Wellness Visit with me. I enjoyed our conversation and look forward to speaking with you again next year. I, as well as your care team,  appreciate your ongoing commitment to your health goals. Please review the following plan we discussed and let me know if I can assist you in the future. Your Game plan/ To Do List    Referrals: If you haven't heard from the office you've been referred to, please reach out to them at the phone provided.   Follow up Visits: Next Medicare AWV with our clinical staff: 11/09/24   Have you seen your provider in the last 6 months (3 months if uncontrolled diabetes)? No Next Office Visit with your provider: Front desk will call to make appt pt aware   Clinician Recommendations:  Aim for 30 minutes of exercise or brisk walking, 6-8 glasses of water, and 5 servings of fruits and vegetables each day.        This is a list of the screening recommended for you and due dates:  Health Maintenance  Topic Date Due   COVID-19 Vaccine (3 - Pfizer risk series) 10/05/2019   Pneumococcal Vaccine for age over 18 (3 of 3 - PCV20 or PCV21) 10/20/2021   Colon Cancer Screening  05/19/2022   Yearly kidney health urinalysis for diabetes  07/03/2023   Complete foot exam   07/03/2023   Medicare Annual Wellness Visit  10/15/2023   Hemoglobin A1C  10/23/2023   Hepatitis C Screening  06/04/2109*   Flu Shot  12/06/2023   Yearly kidney function blood test for diabetes  04/23/2024   DTaP/Tdap/Td vaccine (2 - Td or Tdap) 07/25/2024   Eye exam for diabetics  09/03/2024   Mammogram  09/18/2024   DEXA scan (bone density measurement)  Completed   Zoster (Shingles) Vaccine  Completed   Hepatitis B Vaccine  Aged Out   HPV Vaccine  Aged Out   Meningitis B Vaccine  Aged Out  *Topic was postponed. The date shown is not the original due date.    Advanced directives: (Declined) Advance directive  discussed with you today. Even though you declined this today, please call our office should you change your mind, and we can give you the proper paperwork for you to fill out. Advance Care Planning is important because it:  [x]  Makes sure you receive the medical care that is consistent with your values, goals, and preferences  [x]  It provides guidance to your family and loved ones and reduces their decisional burden about whether or not they are making the right decisions based on your wishes.  Follow the link provided in your after visit summary or read over the paperwork we have mailed to you to help you started getting your Advance Directives in place. If you need assistance in completing these, please reach out to us  so that we can help you!  See attachments for Preventive Care and Fall Prevention Tips.

## 2023-10-31 NOTE — Progress Notes (Addendum)
 Subjective:   Jamie [name] is a 68 y.o. who presents for a Medicare Wellness preventive visit.  As a reminder, Annual Wellness Visits don't include a physical exam, and some assessments may be limited, especially if this visit is performed virtually. We may recommend an in-person follow-up visit with your provider if needed.  Visit Complete: Virtual I connected with  Briell Paulette Holts on 10/31/23 by a video and audio enabled telemedicine application and verified that I am speaking with the correct person using two identifiers.  Patient Location: Home  Provider Location: Office/Clinic  I discussed the limitations of evaluation and management by telemedicine. The patient expressed understanding and agreed to proceed.  Vital Signs: Because this visit was a virtual/telehealth visit, some criteria may be missing or patient reported. Any vitals not documented were not able to be obtained and vitals that have been documented are patient reported.    Persons Participating in Visit: Patient.  AWV Questionnaire: No: Patient Medicare AWV questionnaire was not completed prior to this visit.  Cardiac Risk Factors include: advanced age (>55men, >59 women);diabetes mellitus;dyslipidemia;hypertension     Objective:    Today's Vitals   10/31/23 0841  Weight: 133 lb (60.3 kg)  Height: 5' 2 (1.575 m)   Body mass index is 24.33 kg/m.     10/31/2023    8:45 AM 02/24/2023    2:39 PM 10/15/2022    3:42 PM 05/03/2022    9:31 AM 09/01/2021    2:39 PM 11/01/2019    3:29 PM 04/15/2018   12:10 PM  Advanced Directives  Does Patient Have a Medical Advance Directive? No No No No No No No   Does patient want to make changes to medical advance directive?     Yes (MAU/Ambulatory/Procedural Areas - Information given)    Would patient like information on creating a medical advance directive? No - Patient declined No - Patient declined No - Patient declined No - Patient declined  No - Patient  declined Yes (MAU/Ambulatory/Procedural Areas - Information given)      Data saved with a previous flowsheet row definition    Current Medications (verified) Outpatient Encounter Medications as of 10/31/2023  Medication Sig   ALPRAZolam  (XANAX ) 0.5 MG tablet Take 1 tablet (0.5 mg total) by mouth at bedtime as needed for anxiety or sleep.   benazepril  (LOTENSIN ) 5 MG tablet TAKE 1 TABLET(5 MG) BY MOUTH DAILY   Blood Glucose Monitoring Suppl DEVI 1 each by Does not apply route in the morning, at noon, and at bedtime. May substitute to any manufacturer covered by patient's insurance.   clotrimazole -betamethasone  (LOTRISONE ) cream Apply 1 Application topically daily.   glimepiride  (AMARYL ) 2 MG tablet TAKE 1 TABLET(2 MG) BY MOUTH DAILY   Glucose Blood (BLOOD GLUCOSE TEST STRIPS) STRP 1 each by In Vitro route in the morning and at bedtime. May substitute to any manufacturer covered by patient's insurance.   Multiple Vitamins-Minerals (MULTIVITAMIN WITH MINERALS) tablet Take 1 tablet by mouth daily.   ondansetron  (ZOFRAN  ODT) 4 MG disintegrating tablet Take 1 tablet (4 mg total) by mouth every 8 (eight) hours as needed for nausea or vomiting.   rosuvastatin  (CRESTOR ) 40 MG tablet TAKE 1 TABLET(40 MG) BY MOUTH DAILY   triamcinolone  cream (KENALOG ) 0.1 % APPLY TOPICALLY TO THE AFFECTED AREA TWICE DAILY FOR 10 DAYS. MAXIMUM AND THEN TAKE AT LEAST 10 DAY BREAK   COLLAGEN PO Take by mouth.   cyclobenzaprine (FLEXERIL) 10 MG tablet Take 10 mg by mouth at  bedtime.  (Patient not taking: Reported on 10/31/2023)   empagliflozin  (JARDIANCE ) 10 MG TABS tablet Take 1 tablet (10 mg total) by mouth daily before breakfast. (Patient not taking: Reported on 10/31/2023)   meloxicam  (MOBIC ) 15 MG tablet Take 1 tablet (15 mg total) by mouth daily. (Patient not taking: Reported on 10/31/2023)   No facility-administered encounter medications on file as of 10/31/2023.    Allergies (verified) Metformin  and  Oxycodone-acetaminophen    History: Past Medical History:  Diagnosis Date   Allergy    Diabetes mellitus    ORAL MEDS-NO INSULIN    Endometrial cancer (HCC) 03/2011   03/2011   GERD (gastroesophageal reflux disease)    Hiatal hernia    History of cervical dysplasia    Hyperlipidemia    Hypertension    Malignant neoplasm of body of uterus (HCC) 03/21/2011   S.p hysterectomy   Ovarian cancer (HCC) 03/2011   03/2011   PONV (postoperative nausea and vomiting)    ONLY AFTER CERVICAL FUSION--NO PROB WITH OTHER SURGERIES   Renal disorder    Ulcer    history of gastritis   Past Surgical History:  Procedure Laterality Date   ACHILLES TENDON SURGERY     MULTIPLE SURGERIES BECAUSE OF STAPH INFECTION   CARPAL TUNNEL RELEASE Right 05/03/2022   Procedure: RIGHT CARPAL TUNNEL RELEASE;  Surgeon: Murrell Drivers, MD;  Location: [facility];  Service: Orthopedics;  Laterality: Right;  Bier block   CERVICAL FUSION  09/05/2003   EYE SURGERY Left    macular whole   GYNECOLOGIC CRYOSURGERY  02/05/2003   LUMBAR FUSION  11/05/1998   LYMPH NODE DISSECTION  05/07/2010   MINOR CARPAL TUNNEL Right    PARS PLANA VITRECTOMY W/ REPAIR OF MACULAR HOLE Left    november 2023   STERIOD INJECTION Left 05/03/2022   Procedure: STEROID INJECTION LEFT WRIST;  Surgeon: Murrell Drivers, MD;  Location: [facility];  Service: Orthopedics;  Laterality: Left;   TOTAL VAGINAL HYSTERECTOMY  03/29/2011   Family History  Problem Relation Age of Onset   Diabetes Father    Heart disease Father    Prostate cancer Father    Lymphoma Father    Lung cancer Mother        smoker   Ovarian cancer Cousin    Uterine cancer Cousin    Breast cancer Maternal Aunt    Colon cancer Paternal Grandmother 38   Hemochromatosis Maternal Grandmother    Diabetes Paternal Grandfather    Lung cancer Sister    Healthy Brother    Social History   Socioeconomic History   Marital status: Married    Spouse name:  Not on file   Number of children: 1   Years of education: Not on file   Highest education level: Not on file  Occupational History    Comment: receptionist  Tobacco Use   Smoking status: Never   Smokeless tobacco: Never  Vaping Use   Vaping status: Never Used  Substance and Sexual Activity   Alcohol use: Not Currently    Comment: once every 2-3 months   Drug use: Never   Sexual activity: Yes  Other Topics Concern   Not on file  Social History Narrative   Married. 2 children (biological son 28, 1 step son 67 in 2017). 4 grandkids.       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       Hobbies: roller skating- works at [employer] too! Also walks regularly. Thinking about doing bicycling on trainer.    Social Drivers of Corporate investment banker Strain: Low Risk  (10/31/2023)   Overall Financial Resource Strain (CARDIA)    Difficulty of Paying Living Expenses: Not hard at all  Food Insecurity: No Food Insecurity (10/31/2023)   Hunger Vital Sign    Worried About Running Out of Food in the Last Year: Never true    Ran Out of Food in the Last Year: Never true  Transportation Needs: No Transportation Needs (10/31/2023)   PRAPARE - Administrator, Civil Service (Medical): No    Lack of Transportation (Non-Medical): No  Physical Activity: Insufficiently Active (10/31/2023)   Exercise Vital Sign    Days of Exercise per Week: 3 days    Minutes of Exercise per Session: 40 min  Stress: No Stress Concern Present (10/31/2023)   [employer] of Occupational Health - Occupational Stress Questionnaire    Feeling of Stress: Not at all  Social Connections: Moderately Isolated (10/31/2023)   Social Connection and Isolation Panel    Frequency of Communication with Friends  and Family: More than three times a week    Frequency of Social Gatherings with Friends and Family: More than three times a week    Attends Religious Services: Never    Database administrator or Organizations: No    Attends Engineer, structural: Never    Marital Status: Married    Tobacco Counseling Counseling given: Not Answered    Clinical Intake:  Pre-visit preparation completed: Yes  Pain : No/denies pain     BMI - recorded: 24.33 Nutritional Status: BMI of 19-24  Normal Nutritional Risks: None Diabetes: Yes CBG done?: Yes (153 per pt) CBG resulted in Enter/ Edit results?: No Did pt. bring in CBG monitor from home?: No  Lab Results  Component Value Date   HGBA1C 7.5 (H) 04/24/2023   HGBA1C 6.6 (H) 12/03/2022   HGBA1C 8.3 (H) 07/02/2022     How often do you need to have someone help you when you read instructions, pamphlets, or other written materials from your doctor or pharmacy?: 1 - Never  Interpreter Needed?: No  Information entered by :: Ellouise Haws, LPN   Activities of Daily Living     10/31/2023    8:43 AM  In your present state of health, do you have any difficulty performing the following activities:  Hearing? 1  Comment hearing aids  Vision? 0  Difficulty concentrating or making decisions? 0  Walking or climbing stairs? 1  Comment at times  Dressing or bathing? 0  Doing errands, shopping? 0  Preparing Food and eating ? N  Using the Toilet? N  In the past six months, have you accidently leaked urine? N  Do you have problems with loss of bowel control? N  Managing your Medications? N  Managing your Finances? N  Housekeeping or managing your Housekeeping? N    Patient Care Team: Katrinka Garnette KIDD, MD as PCP - General (Family Medicine) Aplington, Lynwood SQUIBB, MD (Inactive) as Consulting Physician (Orthopedic Surgery) Robinson Idol, MD as Consulting Physician (Ophthalmology) Devonna Sieving, MD as Attending Physician  (Gynecology) [facility], [facility] (Ophthalmology) Micheline Eleanor BIRCH, NP as Nurse Practitioner (Gynecologic Oncology)  I have updated your Care Teams any  recent Medical Services you may have received from other providers in the past year.     Assessment:   This is a routine wellness examination for Anajulia.  Hearing/Vision screen Hearing Screening - Comments:: Pt has hearing aids  Vision Screening - Comments:: Wears rx glasses - up to date with routine eye exams with Dr Robinson    Goals Addressed             This Visit's Progress    Patient Stated       Eat healthier        Depression Screen     10/31/2023    8:48 AM 04/24/2023   11:39 AM 11/02/2022    3:21 PM 10/15/2022    3:43 PM 07/02/2022    2:22 PM 09/01/2021    2:38 PM 02/10/2021    3:28 PM  PHQ 2/9 Scores  PHQ - 2 Score 0 1 0 0 0 0 0  PHQ- 9 Score  2   5      Fall Risk     10/31/2023    8:46 AM 11/02/2022    3:21 PM 10/15/2022    3:45 PM 07/02/2022    2:22 PM 09/01/2021    2:40 PM  Fall Risk   Falls in the past year? 1 0 1 0 1  Number falls in past yr: 1 0 1 0 1  Injury with Fall? 1 0 1 0 1  Comment skinned knees    skinned knees  Risk for fall due to : History of fall(s);Impaired balance/gait No Fall Risks History of fall(s);Impaired vision No Fall Risks Impaired vision;Impaired balance/gait  Follow up Falls prevention discussed Falls evaluation completed Falls prevention discussed Falls evaluation completed Falls prevention discussed      Data saved with a previous flowsheet row definition    MEDICARE RISK AT HOME:  Medicare Risk at Home Any stairs in or around the home?: No If so, are there any without handrails?: No Home free of loose throw rugs in walkways, pet beds, electrical cords, etc?: Yes Adequate lighting in your home to reduce risk of falls?: Yes Life alert?: No Use of a cane, walker or w/c?: No Grab bars in the bathroom?: No Shower chair or bench in shower?: No Elevated toilet  seat or a handicapped toilet?: No  TIMED UP AND GO:  Was the test performed?  No  Cognitive Function: 6CIT completed        10/31/2023    8:49 AM 10/15/2022    3:46 PM 09/01/2021    2:42 PM  6CIT Screen  What Year? 0 points 0 points 0 points  What month? 0 points 0 points 0 points  What time? 0 points 0 points 0 points  Count back from 20 0 points 0 points 0 points  Months in reverse 0 points 0 points 0 points  Repeat phrase 0 points 0 points 0 points  Total Score 0 points 0 points 0 points    Immunizations Immunization History  Administered Date(s) Administered   Fluad Quad(high Dose 65+) 01/14/2020   Fluad Trivalent(High Dose 65+) 04/24/2023   Influenza Inj Mdck Quad With Preservative 03/29/2017   Influenza,inj,Quad PF,6+ Mos 03/19/2013, 03/27/2018   Influenza-Unspecified 01/06/2020   PFIZER(Purple Top)SARS-COV-2 Vaccination 08/13/2019, 09/07/2019   PPD Test 01/03/2021   Pneumococcal Conjugate-13 08/25/2021   Pneumococcal Polysaccharide-23 04/28/2013   Tdap 07/26/2014   Zoster Recombinant(Shingrix) 10/16/2021, 11/05/2022    Screening Tests Health Maintenance  Topic Date Due   COVID-19 Vaccine (3 -  Pfizer risk series) 10/05/2019   Pneumococcal Vaccine: 50+ Years (3 of 3 - PCV20 or PCV21) 10/20/2021   Colonoscopy  05/19/2022   Diabetic kidney evaluation - Urine ACR  07/03/2023   FOOT EXAM  07/03/2023   HEMOGLOBIN A1C  10/23/2023   Hepatitis C Screening  06/04/2109 (Originally 03/09/1974)   INFLUENZA VACCINE  12/06/2023   Diabetic kidney evaluation - eGFR measurement  04/23/2024   DTaP/Tdap/Td (2 - Td or Tdap) 07/25/2024   OPHTHALMOLOGY EXAM  09/03/2024   MAMMOGRAM  09/18/2024   Medicare Annual Wellness (AWV)  10/30/2024   DEXA SCAN  Completed   Zoster Vaccines- Shingrix  Completed   Hepatitis B Vaccines  Aged Out   HPV VACCINES  Aged Out   Meningococcal B Vaccine  Aged Out    Health Maintenance  Health Maintenance Due  Topic Date Due   COVID-19 Vaccine  (3 - Pfizer risk series) 10/05/2019   Pneumococcal Vaccine: 50+ Years (3 of 3 - PCV20 or PCV21) 10/20/2021   Colonoscopy  05/19/2022   Diabetic kidney evaluation - Urine ACR  07/03/2023   FOOT EXAM  07/03/2023   HEMOGLOBIN A1C  10/23/2023   Health Maintenance Items Addressed: See Nurse Notes at the end of this note  Additional Screening:  Vision Screening: Recommended annual ophthalmology exams for early detection of glaucoma and other disorders of the eye. Would you like a referral to an eye doctor? No    Dental Screening: Recommended annual dental exams for proper oral hygiene  Community Resource Referral / Chronic Care Management: CRR required this visit?  No   CCM required this visit?  No   Plan:    I have personally reviewed and noted the following in the patient's chart:   Medical and social history Use of alcohol, tobacco or illicit drugs  Current medications and supplements including opioid prescriptions. Patient is not currently taking opioid prescriptions. Functional ability and status Nutritional status Physical activity Advanced directives List of other physicians Hospitalizations, surgeries, and ER visits in previous 12 months Vitals Screenings to include cognitive, depression, and falls Referrals and appointments  In addition, I have reviewed and discussed with patient certain preventive protocols, quality metrics, and best practice recommendations. A written personalized care plan for preventive services as well as general preventive health recommendations were provided to patient.   Ellouise VEAR Haws, LPN   3/[id number]/7974   After Visit Summary: (MyChart) Due to this being a telephonic visit, the after visit summary with patients personalized plan was offered to patient via MyChart   Notes: PCP Follow Up Recommendations: Pt will be schedule for office visit foot exam due , A1C and stated she has a cough with no fever or other symptoms. Pt made ware of  immunizations due.

## 2023-11-13 DIAGNOSIS — M542 Cervicalgia: Secondary | ICD-10-CM | POA: Diagnosis not present

## 2023-11-13 DIAGNOSIS — M47812 Spondylosis without myelopathy or radiculopathy, cervical region: Secondary | ICD-10-CM | POA: Diagnosis not present

## 2023-11-13 DIAGNOSIS — G894 Chronic pain syndrome: Secondary | ICD-10-CM | POA: Diagnosis not present

## 2023-11-13 DIAGNOSIS — M791 Myalgia, unspecified site: Secondary | ICD-10-CM | POA: Diagnosis not present

## 2024-01-07 NOTE — Progress Notes (Signed)
 Pharmacy Quality Measure Review  This patient is appearing on a report for being at risk of failing the adherence measure for diabetes medications this calendar year.   Medication: Glimepiride  2 mg Last fill date: 12/19/23 for 90 day supply  Insurance report was not up to date. No action needed at this time. Called [facility] pharmacy to confirm.  Jenkins Graces, PharmD PGY1 Pharmacy Resident [phone]

## 2024-01-08 NOTE — Progress Notes (Addendum)
 01/08/2024 - reviewed note below from [facility] [place].  Patient is past due to follow up with Dr Katrinka. She filled last available refill for glimepiride  12/2023.  Forwarding note to administration team with [facility] office to reach out to patient to make follow up appt before refills are needed again.  She also has not had an A1c checked in 2025 yet.   Madelin Ray, PharmD Clinical Pharmacist [facility]

## 2024-01-12 ENCOUNTER — Other Ambulatory Visit: Payer: Self-pay | Admitting: Family Medicine

## 2024-01-16 ENCOUNTER — Encounter: Payer: Self-pay | Admitting: Family Medicine

## 2024-02-11 ENCOUNTER — Ambulatory Visit: Admitting: Family Medicine

## 2024-02-18 NOTE — Progress Notes (Signed)
 Jamie [name]                                          MRN: [id number]   02/18/2024   The [facility]BCI Quality Team Specialist reviewed this patient medical record for the purposes of chart review for care gap closure. The following were reviewed: chart review for care gap closure-breast cancer screening, glycemic status assessment, and kidney health evaluation for diabetes:eGFR  and uACR.    VBCI Quality Team

## 2024-02-25 ENCOUNTER — Ambulatory Visit (INDEPENDENT_AMBULATORY_CARE_PROVIDER_SITE_OTHER): Admitting: Family Medicine

## 2024-02-25 ENCOUNTER — Encounter: Payer: Self-pay | Admitting: Family Medicine

## 2024-02-25 VITALS — BP 122/78 | HR 96 | Temp 98.1°F | Ht 62.0 in | Wt 139.8 lb

## 2024-02-25 DIAGNOSIS — M85851 Other specified disorders of bone density and structure, right thigh: Secondary | ICD-10-CM

## 2024-02-25 DIAGNOSIS — E1122 Type 2 diabetes mellitus with diabetic chronic kidney disease: Secondary | ICD-10-CM | POA: Diagnosis not present

## 2024-02-25 DIAGNOSIS — M85852 Other specified disorders of bone density and structure, left thigh: Secondary | ICD-10-CM

## 2024-02-25 DIAGNOSIS — E1169 Type 2 diabetes mellitus with other specified complication: Secondary | ICD-10-CM

## 2024-02-25 DIAGNOSIS — N183 Chronic kidney disease, stage 3 unspecified: Secondary | ICD-10-CM

## 2024-02-25 DIAGNOSIS — E785 Hyperlipidemia, unspecified: Secondary | ICD-10-CM | POA: Diagnosis not present

## 2024-02-25 DIAGNOSIS — E119 Type 2 diabetes mellitus without complications: Secondary | ICD-10-CM | POA: Diagnosis not present

## 2024-02-25 LAB — HEMOGLOBIN A1C
Hgb A1c MFr Bld: 7.2 % — ABNORMAL HIGH (ref <5.7)
Mean Plasma Glucose: 160 mg/dL
eAG (mmol/L): 8.9 mmol/L

## 2024-02-25 MED ORDER — BENAZEPRIL HCL 5 MG PO TABS: ORAL_TABLET | ORAL | 3 refills | Status: AC

## 2024-02-25 MED ORDER — BLOOD GLUCOSE MONITORING SUPPL DEVI: 1.0000 | 0 refills | Status: AC

## 2024-02-25 MED ORDER — LANCETS MISC: 1.0000 | 0 refills | Status: DC

## 2024-02-25 NOTE — Patient Instructions (Addendum)
 hopefully stable- update a1c today. Continue current meds for now - unfortunately jariance not affordable- she has a brother who is her insurance agent and can help us  with diabetes formulary- id be interested in other options like januvia as woudlnt be as likely to drop her sugar level  Please stop by lab before you go If you have mychart- we will send your results within 3 business days of us  receiving them.  If you do not have mychart- we will call you about results within 5 business days of us  receiving them.  *please also note that you will see labs on mychart as soon as they post. I will later go in and write notes on them- will say notes from Dr. Katrinka   Schedule your bone density test at check out desk.  - located [name] across the [address] from [place] - in the basement - you DO NEED an appointment for the bone density tests.    Recommended follow up: Return in about 6 months (around 08/25/2024) for physical or sooner if needed.Schedule b4 you leave. As long as a1c under 70 sooner otherwise

## 2024-02-25 NOTE — Progress Notes (Signed)
 Phone [phone] In person visit   Subjective:   Jamie [name] is a 68 y.o. year old very pleasant female patient who presents for/with See problem oriented charting Chief Complaint  Patient presents with   Blood Sugar Problem    Blood sugar levels fluctuating x1 month but has since gotten better;    Medical Management of Chronic Issues    Needs script for compression stockings;     Past Medical History-  Patient Active Problem List   Diagnosis Date Noted   History of sepsis 11/01/2019    Priority: High   History of endometrial cancer 05/12/2019    Priority: High   History of ovarian cancer 03/27/2018    Priority: High   Diabetes mellitus, type 2 (HCC) 04/25/2007    Priority: High   Osteopenia 10/31/2020    Priority: Medium    CKD stage 3 due to type 2 diabetes mellitus (HCC) 10/12/2020    Priority: Medium    Lymphedema of leg 08/27/2011    Priority: Medium    Hyperlipidemia associated with type 2 diabetes mellitus (HCC) 01/27/2007    Priority: Medium    Essential hypertension 01/27/2007    Priority: Medium    Fibromyalgia 01/27/2007    Priority: Medium    Bunion of great toe 09/08/2019    Priority: Low   Rash 10/28/2018    Priority: Low   Abdominal pain 05/22/2012    Priority: Low   Nonspecific elevation of levels of transaminase or lactic acid dehydrogenase (LDH) 08/03/2010    Priority: Low   Allergic rhinitis 01/27/2007    Priority: Low   GERD 01/27/2007    Priority: Low   MENOPAUSE-RELATED VASOMOTOR SYMPTOMS, HOT FLASHES 01/27/2007    Priority: Low   Atypical chest pain 01/27/2007    Priority: Low    Medications- reviewed and updated Current Outpatient Medications  Medication Sig Dispense Refill   ALPRAZolam  (XANAX ) 0.5 MG tablet Take 1 tablet (0.5 mg total) by mouth at bedtime as needed for anxiety or sleep. 30 tablet 2   benazepril  (LOTENSIN ) 5 MG tablet TAKE 1 TABLET(5 MG) BY MOUTH DAILY 90 tablet 3   Blood Glucose Monitoring Suppl DEVI 1  each by Does not apply route in the morning, at noon, and at bedtime. May substitute to any manufacturer covered by patient's insurance. 1 each 0   clotrimazole -betamethasone  (LOTRISONE ) cream Apply 1 Application topically daily. 45 g 2   COLLAGEN PO Take by mouth.     glimepiride  (AMARYL ) 2 MG tablet TAKE 1 TABLET(2 MG) BY MOUTH DAILY 90 tablet 3   Glucose Blood (BLOOD GLUCOSE TEST STRIPS) STRP 1 each by In Vitro route in the morning and at bedtime. May substitute to any manufacturer covered by patient's insurance. 200 strip 3   Multiple Vitamins-Minerals (MULTIVITAMIN WITH MINERALS) tablet Take 1 tablet by mouth daily.     ondansetron  (ZOFRAN  ODT) 4 MG disintegrating tablet Take 1 tablet (4 mg total) by mouth every 8 (eight) hours as needed for nausea or vomiting. 20 tablet 2   rosuvastatin  (CRESTOR ) 40 MG tablet TAKE 1 TABLET(40 MG) BY MOUTH DAILY 90 tablet 3   triamcinolone  cream (KENALOG ) 0.1 % APPLY TOPICALLY TO THE AFFECTED AREA TWICE DAILY FOR 10 DAYS. MAXIMUM AND THEN TAKE AT LEAST 10 DAY BREAK 30 g 3   cyclobenzaprine (FLEXERIL) 10 MG tablet Take 10 mg by mouth at bedtime.  (Patient not taking: Reported on 02/25/2024)     empagliflozin  (JARDIANCE ) 10 MG TABS tablet Take 1 tablet (  10 mg total) by mouth daily before breakfast. (Patient not taking: Reported on 02/25/2024) 30 tablet 11   meloxicam  (MOBIC ) 15 MG tablet Take 1 tablet (15 mg total) by mouth daily. (Patient not taking: Reported on 02/25/2024) 30 tablet 1   No current facility-administered medications for this visit.     Objective:  BP 122/78 (BP Location: Left Arm, Patient Position: Sitting, Cuff Size: Normal)   Pulse 96   Temp 98.1 F (36.7 C) (Temporal)   Ht 5' 2 (1.575 m)   Wt 139 lb 12.8 oz (63.4 kg)   LMP  (LMP Unknown)   SpO2 97%   BMI 25.57 kg/m  Gen: NAD, resting comfortably CV: RRR no murmurs rubs or gallops Lungs: CTAB no crackles, wheeze, rhonchi Abdomen: soft/nontender/nondistended/normal bowel sounds. No  rebound or guarding.  Ext: trace edema- equal with compression on right with none on left Skin: warm, dry     Assessment and Plan   # Diabetes S: Medication: amaryl  2 mg -ozempic  07/03/22 start- up to 0.5 mg on 11/02/22 - nausea on metformin  even lower doses -nausea extended on Ozempic  -jardiance  was costly CBGs- sugars fluctuating but then later improved- nothing changed for it to get better or for it to get worse- would go from 70s to 150's so a broard range but doing better. Often at end of long day after snack but betfore dinner but has improved Exercise and diet- started with mushroom coffee and seems to make her feel better Lab Results  Component Value Date   HGBA1C 7.5 (H) 04/24/2023   HGBA1C 6.6 (H) 12/03/2022   HGBA1C 8.3 (H) 07/02/2022  A/P: hopefully stable- update a1c today. Continue current meds for now - unfortunately jariance not affordable- she has a brother who is her insurance agent and can help us  with diabetes formulary- id be interested in other options like januvia as woudlnt be as likely to drop her sugar level  #hypertension #CKD III- with GFR typically 40's or 50's  S: medication: benazepril  5 mg A/P: blood pressure well controlled continue current medications  Kidneys hopefully stable- update today  # Anxiety S:Medication: alprazolam  0.5 mg - uses as needed for anxiety or lseep- last fill 04/24/23   A/P: uses very sparingly   #hyperlipidemia S: Medication: planned atorvastatin  40mg --> rosuvastatin  40mg  in 09/2019 with LDL over 100 Lab Results  Component Value Date   CHOL 113 07/02/2022   HDL 39.10 07/02/2022   LDLCALC  02/10/2021     Comment:     . LDL cholesterol not calculated. Triglyceride levels greater than 400 mg/dL invalidate calculated LDL results. . Reference range: <100 . Desirable range <100 mg/dL for primary prevention;   <70 mg/dL for patients with CHD or diabetic patients  with > or = 2 CHD risk factors. SABRA LDL-C is now calculated  using the Martin-Hopkins  calculation, which is a validated novel method providing  better accuracy than the Friedewald equation in the  estimation of LDL-C.  Gladis APPLETHWAITE et al. SANDREA. 7986;689(80): 2061-2068  (http://education.[employer].com/faq/FAQ164)    LDLDIRECT 40.0 07/02/2022   TRIG (H) 07/02/2022    407.0 Triglyceride is over 400; calculations on Lipids are invalid.   CHOLHDL 3 07/02/2022   A/P: hopefully stable or improved- update lipid panel today. Continue current meds for now   # Low Bone density (formerly osteopenia) S: Last DEXA: October 26, 2020  Your hip fracture risk is 3.8% which is above the 3% desired before considering medications that are more advanced such as Fosamax-if  you would like to discuss Fosamax we can certainly do this at a visit or we can buckle down calcium , vitamin D, weightbearing exercise and recheck in 2 years   Calcium : 1200mg  (through diet ok) recommended - in multivitamin  Vitamin D: 1000 units a day recommended- in multivitamin  A/P: hoping stable or improved- update bone density- if worsens may need medicine    #venous insuffiency/lymphedema- history primarily right leg- compression hose help- wrote prescription - history of trauma -she may need 30-64mmhg and she will let me know  #Wearing hearing aids in 2024- mildly helpful   Recommended follow up: Return in about 6 months (around 08/25/2024) for physical or sooner if needed.Schedule b4 you leave. Future Appointments  Date Time Provider Department Center  11/09/2024  8:40 AM [facility] ANNUAL WELLNESS VISIT 1 [facility] [place]   Lab/Order associations:   ICD-10-CM   1. Type 2 diabetes mellitus without complication, without long-term current use of insulin  (HCC)  E11.9     2. Hyperlipidemia associated with type 2 diabetes mellitus (HCC)  E11.69    E78.5     3. CKD stage 3 due to type 2 diabetes mellitus (HCC)  E11.22    N18.30     4. Osteopenia of both hips  M85.851    M85.852        No orders of the defined types were placed in this encounter.   Return precautions advised.  Garnette Lukes, MD

## 2024-02-26 ENCOUNTER — Ambulatory Visit: Payer: Self-pay | Admitting: Family Medicine

## 2024-02-26 LAB — COMPREHENSIVE METABOLIC PANEL WITH GFR
ALT: 28 U/L (ref 0–35)
AST: 20 U/L (ref 0–37)
Albumin: 4.4 g/dL (ref 3.5–5.2)
Alkaline Phosphatase: 64 U/L (ref 39–117)
BUN: 17 mg/dL (ref 6–23)
CO2: 28 meq/L (ref 19–32)
Calcium: 9.1 mg/dL (ref 8.4–10.5)
Chloride: 104 meq/L (ref 96–112)
Creatinine, Ser: 1.14 mg/dL (ref 0.40–1.20)
GFR: 49.31 mL/min — ABNORMAL LOW (ref >60.00)
Glucose, Bld: 121 mg/dL — ABNORMAL HIGH (ref 70–99)
Potassium: 4.1 meq/L (ref 3.5–5.1)
Sodium: 140 meq/L (ref 135–145)
Total Bilirubin: 0.4 mg/dL (ref 0.2–1.2)
Total Protein: 6.1 g/dL (ref 6.0–8.3)

## 2024-02-26 LAB — CBC WITH DIFFERENTIAL/PLATELET
Basophils Absolute: 0.1 K/uL (ref 0.0–0.1)
Basophils Relative: 1 % (ref 0.0–3.0)
Eosinophils Absolute: 0.9 K/uL — ABNORMAL HIGH (ref 0.0–0.7)
Eosinophils Relative: 11.2 % — ABNORMAL HIGH (ref 0.0–5.0)
HCT: 41.4 % (ref 36.0–46.0)
Hemoglobin: 13.6 g/dL (ref 12.0–15.0)
Lymphocytes Relative: 28.5 % (ref 12.0–46.0)
Lymphs Abs: 2.3 K/uL (ref 0.7–4.0)
MCHC: 33 g/dL (ref 30.0–36.0)
MCV: 89.8 fl (ref 78.0–100.0)
Monocytes Absolute: 0.6 K/uL (ref 0.1–1.0)
Monocytes Relative: 7.6 % (ref 3.0–12.0)
Neutro Abs: 4.2 K/uL (ref 1.4–7.7)
Neutrophils Relative %: 51.7 % (ref 43.0–77.0)
Platelets: 230 K/uL (ref 150.0–400.0)
RBC: 4.61 Mil/uL (ref 3.87–5.11)
RDW: 13.2 % (ref 11.5–15.5)
WBC: 8.2 K/uL (ref 4.0–10.5)

## 2024-02-26 LAB — LIPID PANEL
Cholesterol: 130 mg/dL (ref 0–200)
HDL: 41.5 mg/dL (ref >39.00)
LDL Cholesterol: 29 mg/dL (ref 0–99)
NonHDL: 88.85
Total CHOL/HDL Ratio: 3
Triglycerides: 298 mg/dL — ABNORMAL HIGH (ref 0.0–149.0)
VLDL: 59.6 mg/dL — ABNORMAL HIGH (ref 0.0–40.0)

## 2024-02-26 LAB — MICROALBUMIN / CREATININE URINE RATIO
Creatinine,U: 50.9 mg/dL
Microalb Creat Ratio: 17.3 mg/g (ref 0.0–30.0)
Microalb, Ur: 0.9 mg/dL (ref 0.0–1.9)

## 2024-03-04 ENCOUNTER — Ambulatory Visit (INDEPENDENT_AMBULATORY_CARE_PROVIDER_SITE_OTHER)
Admission: RE | Admit: 2024-03-04 | Discharge: 2024-03-04 | Disposition: A | Discharge status: Home / Self Care | Source: Ambulatory Visit | Attending: Family Medicine | Admitting: Family Medicine

## 2024-03-04 DIAGNOSIS — M85851 Other specified disorders of bone density and structure, right thigh: Secondary | ICD-10-CM | POA: Diagnosis not present

## 2024-03-04 DIAGNOSIS — M85852 Other specified disorders of bone density and structure, left thigh: Secondary | ICD-10-CM | POA: Diagnosis not present

## 2024-03-14 ENCOUNTER — Other Ambulatory Visit: Payer: Self-pay | Admitting: Family Medicine

## 2024-03-25 NOTE — Progress Notes (Signed)
 Jamie [name]                                          MRN: [id number]   03/25/2024   The [facility]BCI Quality Team Specialist reviewed this patient medical record for the purposes of chart review for care gap closure. The following were reviewed: abstraction for care gap closure-glycemic status assessment. All other care gaps closed in [facility] portal.    VBCI Quality Team

## 2024-03-30 ENCOUNTER — Encounter: Payer: Self-pay | Admitting: Family Medicine

## 2024-04-03 ENCOUNTER — Other Ambulatory Visit: Payer: Self-pay | Admitting: Family Medicine

## 2024-05-18 ENCOUNTER — Other Ambulatory Visit: Payer: Self-pay | Admitting: Family Medicine

## 2024-08-25 ENCOUNTER — Encounter: Admitting: Family Medicine

## 2024-11-09 ENCOUNTER — Ambulatory Visit
